# Patient Record
Sex: Female | Born: 1985 | Race: White | Hispanic: Yes | Marital: Married | Smoking: Never smoker
Health system: Southern US, Community
[De-identification: ages and names within clinical notes are randomized; demographics above are authoritative.]

## PROBLEM LIST (undated history)

## (undated) DIAGNOSIS — R0602 Shortness of breath: Secondary | ICD-10-CM

## (undated) DIAGNOSIS — K641 Second degree hemorrhoids: Secondary | ICD-10-CM

## (undated) DIAGNOSIS — K8020 Calculus of gallbladder without cholecystitis without obstruction: Secondary | ICD-10-CM

## (undated) DIAGNOSIS — K2970 Gastritis, unspecified, without bleeding: Secondary | ICD-10-CM

## (undated) DIAGNOSIS — J45909 Unspecified asthma, uncomplicated: Secondary | ICD-10-CM

## (undated) DIAGNOSIS — H6690 Otitis media, unspecified, unspecified ear: Secondary | ICD-10-CM

## (undated) DIAGNOSIS — K635 Polyp of colon: Secondary | ICD-10-CM

## (undated) HISTORY — DX: Unspecified asthma, uncomplicated: J45.909

## (undated) HISTORY — DX: Otitis media, unspecified, unspecified ear: H66.90

## (undated) HISTORY — DX: Second degree hemorrhoids: K64.1

## (undated) HISTORY — PX: COLONOSCOPY: SHX174

## (undated) HISTORY — DX: Gastritis, unspecified, without bleeding: K29.70

## (undated) HISTORY — DX: Polyp of colon: K63.5

---

## 2005-05-12 ENCOUNTER — Emergency Department (HOSPITAL_COMMUNITY): Admission: EM | Admit: 2005-05-12 | Discharge: 2005-05-12 | Payer: Self-pay | Admitting: Family Medicine

## 2006-07-06 ENCOUNTER — Inpatient Hospital Stay (HOSPITAL_COMMUNITY): Admission: AD | Admit: 2006-07-06 | Discharge: 2006-07-08 | Payer: Self-pay | Admitting: Obstetrics

## 2008-06-19 ENCOUNTER — Ambulatory Visit: Payer: Self-pay | Admitting: Family Medicine

## 2008-06-23 ENCOUNTER — Ambulatory Visit (HOSPITAL_COMMUNITY): Admission: RE | Admit: 2008-06-23 | Discharge: 2008-06-23 | Payer: Self-pay | Admitting: Family Medicine

## 2008-06-23 ENCOUNTER — Ambulatory Visit: Payer: Self-pay | Admitting: *Deleted

## 2008-07-03 ENCOUNTER — Ambulatory Visit: Payer: Self-pay | Admitting: Family Medicine

## 2008-07-03 LAB — CONVERTED CEMR LAB
ALT: 15 units/L (ref 0–35)
AST: 14 units/L (ref 0–37)
BUN: 11 mg/dL (ref 6–23)
Basophils Absolute: 0 10*3/uL (ref 0.0–0.1)
Basophils Relative: 1 % (ref 0–1)
Calcium: 9 mg/dL (ref 8.4–10.5)
Cholesterol: 146 mg/dL (ref 0–200)
Creatinine, Ser: 0.63 mg/dL (ref 0.40–1.20)
Eosinophils Absolute: 0.2 10*3/uL (ref 0.0–0.7)
Glucose, Bld: 88 mg/dL (ref 70–99)
HCT: 41.5 % (ref 36.0–46.0)
Hemoglobin: 13.4 g/dL (ref 12.0–15.0)
LDL Cholesterol: 73 mg/dL (ref 0–99)
Lymphocytes Relative: 34 % (ref 12–46)
Lymphs Abs: 2.4 10*3/uL (ref 0.7–4.0)
Neutro Abs: 4.2 10*3/uL (ref 1.7–7.7)
Neutrophils Relative %: 59 % (ref 43–77)
Potassium: 4.1 meq/L (ref 3.5–5.3)
Triglycerides: 127 mg/dL (ref <150)
VLDL: 25 mg/dL (ref 0–40)

## 2008-07-10 ENCOUNTER — Ambulatory Visit: Payer: Self-pay | Admitting: Family Medicine

## 2009-08-01 ENCOUNTER — Inpatient Hospital Stay (HOSPITAL_COMMUNITY): Admission: RE | Admit: 2009-08-01 | Discharge: 2009-08-03 | Payer: Self-pay | Admitting: Obstetrics

## 2010-12-11 LAB — CBC
HCT: 32.9 % — ABNORMAL LOW (ref 36.0–46.0)
HCT: 35.6 % — ABNORMAL LOW (ref 36.0–46.0)
Hemoglobin: 11.8 g/dL — ABNORMAL LOW (ref 12.0–15.0)
MCV: 88 fL (ref 78.0–100.0)
MCV: 88.1 fL (ref 78.0–100.0)
Platelets: 198 10*3/uL (ref 150–400)
Platelets: 202 10*3/uL (ref 150–400)
RBC: 3.73 MIL/uL — ABNORMAL LOW (ref 3.87–5.11)
RBC: 4.04 MIL/uL (ref 3.87–5.11)
WBC: 15.2 10*3/uL — ABNORMAL HIGH (ref 4.0–10.5)

## 2011-01-24 NOTE — Op Note (Signed)
NAMEELIZBETH, POSA NO.:  [id number]   MEDICAL RECORD NO.:  [id number]          PATIENT TYPE:  INP   LOCATION:  9166                          FACILITY:  [facility]   PHYSICIAN:  Kathreen Cosier, M.D.DATE OF BIRTH:  1986-03-19   DATE OF PROCEDURE:  07/06/2006  DATE OF DISCHARGE:                                 OPERATIVE REPORT   DELIVERY NOTE:  The patient is a 25 year old primigravida who was in labor,  became fully dilated, and then pushed for 2 hours 15 minutes to a +3 station  and desired assistance.  A midline episiotomy was performed, a vacuum was  applied through one contraction on delivery of the vertex.  There were no  pop-offs.  It was a female, LOA, Apgar 9/9.  Team in attendance and the  nasopharynx DeLee suctioned prior to the delivery of the shoulders.  Placenta was spontaneous.  She had a third degree, which was repaired with 2-  0 Vicryl sutures.  Post repair, the sphincter was intact.           ______________________________  Kathreen Cosier, M.D.     BAM/MEDQ  D:  07/06/2006  T:  07/07/2006  Job:  [id number]

## 2011-02-12 ENCOUNTER — Inpatient Hospital Stay (HOSPITAL_COMMUNITY): Payer: Medicaid Other

## 2011-02-12 ENCOUNTER — Inpatient Hospital Stay (HOSPITAL_COMMUNITY)
Admission: AD | Admit: 2011-02-12 | Discharge: 2011-02-12 | Disposition: A | Discharge status: Home / Self Care | Payer: Medicaid Other | Source: Ambulatory Visit | Attending: Obstetrics | Admitting: Obstetrics

## 2011-02-12 DIAGNOSIS — O209 Hemorrhage in early pregnancy, unspecified: Secondary | ICD-10-CM | POA: Insufficient documentation

## 2011-02-12 LAB — CBC
HCT: 38.5 % (ref 36.0–46.0)
Hemoglobin: 13.2 g/dL (ref 12.0–15.0)
MCH: 29.5 pg (ref 26.0–34.0)
MCHC: 34.3 g/dL (ref 30.0–36.0)
MCV: 86.1 fL (ref 78.0–100.0)
Platelets: 235 10*3/uL (ref 150–400)
RDW: 12.6 % (ref 11.5–15.5)
WBC: 9.5 10*3/uL (ref 4.0–10.5)

## 2011-02-12 LAB — WET PREP, GENITAL
Trich, Wet Prep: NONE SEEN
Yeast Wet Prep HPF POC: NONE SEEN

## 2011-02-12 LAB — ABO/RH: ABO/RH(D): A POS

## 2011-02-14 ENCOUNTER — Other Ambulatory Visit (HOSPITAL_COMMUNITY): Payer: Self-pay | Admitting: Obstetrics

## 2011-02-14 DIAGNOSIS — IMO0002 Reserved for concepts with insufficient information to code with codable children: Secondary | ICD-10-CM

## 2011-02-17 ENCOUNTER — Ambulatory Visit (HOSPITAL_COMMUNITY)
Admission: RE | Admit: 2011-02-17 | Discharge: 2011-02-17 | Disposition: A | Discharge status: Home / Self Care | Payer: Medicaid Other | Source: Ambulatory Visit | Attending: Obstetrics | Admitting: Obstetrics

## 2011-02-17 DIAGNOSIS — N949 Unspecified condition associated with female genital organs and menstrual cycle: Secondary | ICD-10-CM | POA: Insufficient documentation

## 2011-02-17 DIAGNOSIS — IMO0002 Reserved for concepts with insufficient information to code with codable children: Secondary | ICD-10-CM | POA: Insufficient documentation

## 2011-03-05 ENCOUNTER — Other Ambulatory Visit (HOSPITAL_COMMUNITY): Payer: Self-pay | Admitting: Obstetrics

## 2011-03-05 DIAGNOSIS — R102 Pelvic and perineal pain: Secondary | ICD-10-CM

## 2011-03-07 ENCOUNTER — Ambulatory Visit (HOSPITAL_COMMUNITY)
Admission: RE | Admit: 2011-03-07 | Discharge: 2011-03-07 | Disposition: A | Discharge status: Home / Self Care | Payer: Medicaid Other | Source: Ambulatory Visit | Attending: Obstetrics | Admitting: Obstetrics

## 2011-03-07 DIAGNOSIS — O034 Incomplete spontaneous abortion without complication: Secondary | ICD-10-CM | POA: Insufficient documentation

## 2011-03-07 DIAGNOSIS — R102 Pelvic and perineal pain: Secondary | ICD-10-CM

## 2012-08-22 ENCOUNTER — Observation Stay (HOSPITAL_COMMUNITY)
Admission: EM | Admit: 2012-08-22 | Discharge: 2012-08-24 | Disposition: A | Discharge status: Home / Self Care | Payer: Self-pay | Attending: General Surgery | Admitting: General Surgery

## 2012-08-22 ENCOUNTER — Encounter (HOSPITAL_COMMUNITY): Payer: Self-pay | Admitting: *Deleted

## 2012-08-22 ENCOUNTER — Ambulatory Visit: Payer: Self-pay | Admitting: Internal Medicine

## 2012-08-22 VITALS — BP 144/91 | HR 71 | Temp 97.9°F | Resp 16 | Ht 63.58 in | Wt 201.6 lb

## 2012-08-22 DIAGNOSIS — K2970 Gastritis, unspecified, without bleeding: Secondary | ICD-10-CM

## 2012-08-22 DIAGNOSIS — R1013 Epigastric pain: Secondary | ICD-10-CM | POA: Insufficient documentation

## 2012-08-22 DIAGNOSIS — K8010 Calculus of gallbladder with chronic cholecystitis without obstruction: Principal | ICD-10-CM | POA: Insufficient documentation

## 2012-08-22 DIAGNOSIS — R1011 Right upper quadrant pain: Secondary | ICD-10-CM

## 2012-08-22 DIAGNOSIS — R1012 Left upper quadrant pain: Secondary | ICD-10-CM

## 2012-08-22 DIAGNOSIS — K819 Cholecystitis, unspecified: Secondary | ICD-10-CM

## 2012-08-22 HISTORY — DX: Calculus of gallbladder without cholecystitis without obstruction: K80.20

## 2012-08-22 HISTORY — DX: Shortness of breath: R06.02

## 2012-08-22 LAB — CBC WITH DIFFERENTIAL/PLATELET
Basophils Absolute: 0 10*3/uL (ref 0.0–0.1)
Eosinophils Absolute: 0.1 10*3/uL (ref 0.0–0.7)
Lymphocytes Relative: 17 % (ref 12–46)
Lymphs Abs: 2.2 10*3/uL (ref 0.7–4.0)
MCH: 29.5 pg (ref 26.0–34.0)
Neutrophils Relative %: 75 % (ref 43–77)
Platelets: 250 10*3/uL (ref 150–400)
RBC: 4.55 MIL/uL (ref 3.87–5.11)
RDW: 12.2 % (ref 11.5–15.5)
WBC: 13.3 10*3/uL — ABNORMAL HIGH (ref 4.0–10.5)

## 2012-08-22 LAB — COMPREHENSIVE METABOLIC PANEL
ALT: 17 U/L (ref 0–35)
ALT: 17 U/L (ref 0–35)
AST: 14 U/L (ref 0–37)
AST: 15 U/L (ref 0–37)
Albumin: 4.7 g/dL (ref 3.5–5.2)
Alkaline Phosphatase: 61 U/L (ref 39–117)
Alkaline Phosphatase: 67 U/L (ref 39–117)
BUN: 12 mg/dL (ref 6–23)
CO2: 25 mEq/L (ref 19–32)
Chloride: 105 mEq/L (ref 96–112)
GFR calc Af Amer: >90 mL/min (ref >90)
Glucose, Bld: 93 mg/dL (ref 70–99)
Glucose, Bld: 100 mg/dL — ABNORMAL HIGH (ref 70–99)
Potassium: 3.7 mEq/L (ref 3.5–5.1)
Sodium: 135 mEq/L (ref 135–145)
Total Protein: 7.4 g/dL (ref 6.0–8.3)
Total Protein: 8 g/dL (ref 6.0–8.3)

## 2012-08-22 LAB — POCT CBC
HCT, POC: 42.1 % (ref 37.7–47.9)
Hemoglobin: 13.2 g/dL (ref 12.2–16.2)
Lymph, poc: 3.4 (ref 0.6–3.4)
MCH, POC: 27.9 pg (ref 27–31.2)
MCV: 89 fL (ref 80–97)
MID (cbc): 0.6 (ref 0–0.9)
POC LYMPH PERCENT: 36.6 %L (ref 10–50)
POC MID %: 6 %M (ref 0–12)

## 2012-08-22 LAB — GLUCOSE, POCT (MANUAL RESULT ENTRY): POC Glucose: 90 mg/dl (ref 70–99)

## 2012-08-22 LAB — POCT SEDIMENTATION RATE: POCT SED RATE: 20 mm/hr (ref 0–22)

## 2012-08-22 LAB — LIPASE: Lipase: 12 U/L (ref 0–75)

## 2012-08-22 MED ORDER — GI COCKTAIL ~~LOC~~
30.0000 mL | Freq: Once | ORAL | Status: AC
  Administered 2012-08-22: 15 mL via ORAL
  Administered 2012-08-22: 30 mL via ORAL

## 2012-08-22 MED ORDER — HYDROCODONE-ACETAMINOPHEN 7.5-500 MG/15ML PO SOLN
10.0000 mL | Freq: Once | ORAL | Status: AC
  Administered 2012-08-22: 10 mL via ORAL

## 2012-08-22 MED ORDER — OMEPRAZOLE 40 MG PO CPDR: 40.0000 mg | DELAYED_RELEASE_CAPSULE | Freq: Every day | ORAL | Status: DC

## 2012-08-22 MED ORDER — HYDROCODONE-ACETAMINOPHEN 7.5-325 MG/15ML PO SOLN: 5.0000 mL | Freq: Four times a day (QID) | ORAL | Status: DC | PRN

## 2012-08-22 NOTE — Patient Instructions (Addendum)
Cholecystitis Cholecystitis is an inflammation of your gallbladder. It is usually caused by a buildup of gallstones or sludge (cholelithiasis) in your gallbladder. The gallbladder stores a fluid that helps digest fats (bile). Cholecystitis is serious and needs treatment right away.  CAUSES   Gallstones. Gallstones can block the tube that leads to your gallbladder, causing bile to build up. As bile builds up, the gallbladder becomes inflamed.  Bile duct problems, such as blockage from scarring or kinking.  Tumors. Tumors can stop bile from leaving your gallbladder correctly, causing bile to build up. As bile builds up, the gallbladder becomes inflamed. SYMPTOMS   Nausea.  Vomiting.  Abdominal pain, especially in the upper right area of your abdomen.  Abdominal tenderness or bloating.  Sweating.  Chills.  Fever.  Yellowing of the skin and the whites of the eyes (jaundice). DIAGNOSIS  Your caregiver may order blood tests to look for infection or gallbladder problems. Your caregiver may also order imaging tests, such as an ultrasound or computed tomography (CT) scan. Further tests may include a hepatobiliary iminodiacetic acid (HIDA) scan. This scan allows your caregiver to see your bile move from the liver to the gallbladder and to the small intestine. TREATMENT  A hospital stay is usually necessary to lessen the inflammation of your gallbladder. You may be required to not eat or drink (fast) for a certain amount of time. You may be given medicine to treat pain or an antibiotic medicine to treat an infection. Surgery may be needed to remove your gallbladder (cholecystectomy) once the inflammation has gone down. Surgery may be needed right away if you develop complications such as death of gallbladder tissue (gangrene) or a tear (perforation) of the gallbladder.  HOME CARE INSTRUCTIONS  Home care will depend on your treatment. In general:  If you were given antibiotics, take them as  directed. Finish them even if you start to feel better.  Only take over-the-counter or prescription medicines for pain, discomfort, or fever as directed by your caregiver.  Follow a low-fat diet until you see your caregiver again.  Keep all follow-up visits as directed by your caregiver. SEEK IMMEDIATE MEDICAL CARE IF:   Your pain is increasing and not controlled by medicines.  Your pain moves to another part of your abdomen or to your back.  You have a fever.  You have nausea and vomiting. MAKE SURE YOU:  Understand these instructions.  Will watch your condition.  Will get help right away if you are not doing well or get worse. Document Released: 08/25/2005 Document Revised: 11/17/2011 Document Reviewed: 07/11/2011 [facility] Patient Information 2013 [place], [place]. Gastritis, Adult Gastritis is soreness and swelling (inflammation) of the lining of the stomach. Gastritis can develop as a sudden onset (acute) or long-term (chronic) condition. If gastritis is not treated, it can lead to stomach bleeding and ulcers. CAUSES  Gastritis occurs when the stomach lining is weak or damaged. Digestive juices from the stomach then inflame the weakened stomach lining. The stomach lining may be weak or damaged due to viral or bacterial infections. One common bacterial infection is the Helicobacter pylori infection. Gastritis can also result from excessive alcohol consumption, taking certain medicines, or having too much acid in the stomach.  SYMPTOMS  In some cases, there are no symptoms. When symptoms are present, they may include:  Pain or a burning sensation in the upper abdomen.  Nausea.  Vomiting.  An uncomfortable feeling of fullness after eating. DIAGNOSIS  Your caregiver may suspect you have gastritis  based on your symptoms and a physical exam. To determine the cause of your gastritis, your caregiver may perform the following:  Blood or stool tests to check for the H pylori  bacterium.  Gastroscopy. A thin, flexible tube (endoscope) is passed down the esophagus and into the stomach. The endoscope has a light and camera on the end. Your caregiver uses the endoscope to view the inside of the stomach.  Taking a tissue sample (biopsy) from the stomach to examine under a microscope. TREATMENT  Depending on the cause of your gastritis, medicines may be prescribed. If you have a bacterial infection, such as an H pylori infection, antibiotics may be given. If your gastritis is caused by too much acid in the stomach, H2 blockers or antacids may be given. Your caregiver may recommend that you stop taking aspirin, ibuprofen, or other nonsteroidal anti-inflammatory drugs (NSAIDs). HOME CARE INSTRUCTIONS  Only take over-the-counter or prescription medicines as directed by your caregiver.  If you were given antibiotic medicines, take them as directed. Finish them even if you start to feel better.  Drink enough fluids to keep your urine clear or pale yellow.  Avoid foods and drinks that make your symptoms worse, such as:  Caffeine or alcoholic drinks.  Chocolate.  Peppermint or mint flavorings.  Garlic and onions.  Spicy foods.  Citrus fruits, such as oranges, lemons, or limes.  Tomato-based foods such as sauce, chili, salsa, and pizza.  Fried and fatty foods.  Eat small, frequent meals instead of large meals. SEEK IMMEDIATE MEDICAL CARE IF:   You have black or dark red stools.  You vomit blood or material that looks like coffee grounds.  You are unable to keep fluids down.  Your abdominal pain gets worse.  You have a fever.  You do not feel better after 1 week.  You have any other questions or concerns. MAKE SURE YOU:  Understand these instructions.  Will watch your condition.  Will get help right away if you are not doing well or get worse. Document Released: 08/19/2001 Document Revised: 02/24/2012 Document Reviewed: 10/08/2011 [facility]  Patient Information 2013 [place], [place].

## 2012-08-22 NOTE — ED Notes (Signed)
Pt reports epigastric pain that goes into her back.  Reports nausea, denies vomiting.  Pt reports being at urgent care earlier today, and was told to have an ultrasound in the morning.

## 2012-08-22 NOTE — Progress Notes (Signed)
  Subjective:    Patient ID: Dana Lindsey, female    DOB: Nov 11, 1985, 26 y.o.   MRN: [id number]  HPI 3rd attack of mid epigastric severe pain over last few years.No US done , no health insurance. No etoh,smoking. No hx of GB disese or pancreatitis but never worked up. No fever, nausea. She took tums and mylanta w/out success.   Review of Systems     Objective:   Physical Exam  Vitals reviewed. Constitutional: She is oriented to person, place, and time. She appears well-nourished. She appears distressed.  Eyes: EOM are normal. No scleral icterus.  Neck: Neck supple.  Cardiovascular: Normal rate, regular rhythm and normal heart sounds.   Pulmonary/Chest: Effort normal and breath sounds normal.  Abdominal: Bowel sounds are normal. She exhibits no distension, no ascites and no mass. There is no hepatosplenomegaly. There is tenderness in the right upper quadrant, epigastric area and left upper quadrant. There is guarding and positive Murphy's sign. There is no rigidity, no rebound, no CVA tenderness and no tenderness at McBurney's point.         Pain shoots thru to right mid thorax  Neurological: She is alert and oriented to person, place, and time. She exhibits normal muscle tone. Coordination normal.  Psychiatric: She has a normal mood and affect. Her behavior is normal.  Trial 45cc gi coctail/response Lortab 10cc po/in svere pain  Has had 905 relief of pain, thinks pink med helped most. Results for orders placed in visit on 08/22/12  POCT CBC      Component Value Range   WBC 9.2  4.6 - 10.2 K/uL   Lymph, poc 3.4  0.6 - 3.4   POC LYMPH PERCENT 36.6  10 - 50 %L   MID (cbc) 0.6  0 - 0.9   POC MID % 6.0  0 - 12 %M   POC Granulocyte 5.3  2 - 6.9   Granulocyte percent 57.4  37 - 80 %G   RBC 4.73  4.04 - 5.48 M/uL   Hemoglobin 13.2  12.2 - 16.2 g/dL   HCT, POC 40.9  81.1 - 47.9 %   MCV 89.0  80 - 97 fL   MCH, POC 27.9  27 - 31.2 pg   MCHC 31.4 (*) 31.8 - 35.4 g/dL   RDW, POC 91.4      Platelet Count, POC 284  142 - 424 K/uL   MPV 9.8  0 - 99.8 fL  GLUCOSE, POCT (MANUAL RESULT ENTRY)      Component Value Range   POC Glucose 90  70 - 99 mg/dl         Assessment & Plan:  Schedule abdominal US at hospital tonite or tomorrow Lortab elixir prn pain/Omeprazole 40mg  qd F/up 3days

## 2012-08-23 ENCOUNTER — Encounter (HOSPITAL_COMMUNITY): Payer: Self-pay | Admitting: Certified Registered"

## 2012-08-23 ENCOUNTER — Encounter (HOSPITAL_COMMUNITY): Payer: Self-pay | Admitting: General Practice

## 2012-08-23 ENCOUNTER — Telehealth: Payer: Self-pay

## 2012-08-23 ENCOUNTER — Encounter (HOSPITAL_COMMUNITY)
Admission: EM | Disposition: A | Discharge status: Home / Self Care | Payer: Self-pay | Source: Home / Self Care | Attending: Emergency Medicine

## 2012-08-23 ENCOUNTER — Observation Stay (HOSPITAL_COMMUNITY): Payer: Self-pay | Admitting: Certified Registered"

## 2012-08-23 ENCOUNTER — Observation Stay (HOSPITAL_COMMUNITY): Payer: Self-pay

## 2012-08-23 ENCOUNTER — Emergency Department (HOSPITAL_COMMUNITY): Payer: Self-pay

## 2012-08-23 DIAGNOSIS — K8010 Calculus of gallbladder with chronic cholecystitis without obstruction: Secondary | ICD-10-CM

## 2012-08-23 HISTORY — PX: CHOLECYSTECTOMY: SHX55

## 2012-08-23 LAB — URINALYSIS, ROUTINE W REFLEX MICROSCOPIC
Bilirubin Urine: NEGATIVE
Glucose, UA: NEGATIVE mg/dL
Specific Gravity, Urine: 1.023 (ref 1.005–1.030)
Urobilinogen, UA: 0.2 mg/dL (ref 0.0–1.0)
pH: 7 (ref 5.0–8.0)

## 2012-08-23 LAB — POCT PREGNANCY, URINE: Preg Test, Ur: NEGATIVE

## 2012-08-23 LAB — URINE MICROSCOPIC-ADD ON

## 2012-08-23 LAB — SURGICAL PCR SCREEN: MRSA, PCR: NEGATIVE

## 2012-08-23 SURGERY — LAPAROSCOPIC CHOLECYSTECTOMY WITH INTRAOPERATIVE CHOLANGIOGRAM
Anesthesia: General | Site: Abdomen | Wound class: Contaminated

## 2012-08-23 MED ORDER — SODIUM CHLORIDE 0.9 % IR SOLN
Status: DC | PRN
  Administered 2012-08-23: 1000 mL

## 2012-08-23 MED ORDER — ONDANSETRON HCL 4 MG/2ML IJ SOLN
4.0000 mg | Freq: Once | INTRAMUSCULAR | Status: AC
  Administered 2012-08-23: 4 mg via INTRAVENOUS
  Filled 2012-08-23: qty 2

## 2012-08-23 MED ORDER — ONDANSETRON HCL 4 MG/2ML IJ SOLN
4.0000 mg | Freq: Four times a day (QID) | INTRAMUSCULAR | Status: DC | PRN
  Administered 2012-08-23: 4 mg via INTRAVENOUS
  Filled 2012-08-23: qty 2

## 2012-08-23 MED ORDER — SODIUM CHLORIDE 0.9 % IV SOLN
Freq: Once | INTRAVENOUS | Status: AC
  Administered 2012-08-23: 10 mL/h via INTRAVENOUS

## 2012-08-23 MED ORDER — DEXTROSE 5 % IV SOLN
1.0000 g | Freq: Four times a day (QID) | INTRAVENOUS | Status: DC
  Administered 2012-08-23 – 2012-08-24 (×5): 1 g via INTRAVENOUS
  Filled 2012-08-23 (×7): qty 1

## 2012-08-23 MED ORDER — ONDANSETRON HCL 4 MG/2ML IJ SOLN: 4.0000 mg | Freq: Once | INTRAMUSCULAR | Status: AC | PRN

## 2012-08-23 MED ORDER — PANTOPRAZOLE SODIUM 40 MG IV SOLR
40.0000 mg | Freq: Every day | INTRAVENOUS | Status: DC
  Administered 2012-08-23: 40 mg via INTRAVENOUS
  Filled 2012-08-23 (×2): qty 40

## 2012-08-23 MED ORDER — OXYCODONE-ACETAMINOPHEN 5-325 MG PO TABS
1.0000 | ORAL_TABLET | ORAL | Status: DC | PRN
  Administered 2012-08-24: 2 via ORAL
  Filled 2012-08-23 (×2): qty 2

## 2012-08-23 MED ORDER — FENTANYL CITRATE 0.05 MG/ML IJ SOLN
INTRAMUSCULAR | Status: DC | PRN
Start: 2012-08-23 — End: 2012-08-23
  Administered 2012-08-23 (×2): 50 ug via INTRAVENOUS
  Administered 2012-08-23: 150 ug via INTRAVENOUS

## 2012-08-23 MED ORDER — MORPHINE SULFATE 2 MG/ML IJ SOLN
2.0000 mg | INTRAMUSCULAR | Status: DC | PRN
  Administered 2012-08-24 (×2): 2 mg via INTRAVENOUS
  Filled 2012-08-23 (×2): qty 1

## 2012-08-23 MED ORDER — SODIUM CHLORIDE 0.9 % IV SOLN
INTRAVENOUS | Status: DC
  Administered 2012-08-23: 100 mL/h via INTRAVENOUS

## 2012-08-23 MED ORDER — ACETAMINOPHEN 10 MG/ML IV SOLN
1000.0000 mg | Freq: Once | INTRAVENOUS | Status: AC | PRN
  Filled 2012-08-23: qty 100

## 2012-08-23 MED ORDER — DEXTROSE 5 % IV SOLN
INTRAVENOUS | Status: AC
  Filled 2012-08-23: qty 1

## 2012-08-23 MED ORDER — MIDAZOLAM HCL 5 MG/5ML IJ SOLN
INTRAMUSCULAR | Status: DC | PRN
  Administered 2012-08-23: 2 mg via INTRAVENOUS

## 2012-08-23 MED ORDER — ROCURONIUM BROMIDE 100 MG/10ML IV SOLN
INTRAVENOUS | Status: DC | PRN
  Administered 2012-08-23: 50 mg via INTRAVENOUS

## 2012-08-23 MED ORDER — PROPOFOL 10 MG/ML IV BOLUS
INTRAVENOUS | Status: DC | PRN
  Administered 2012-08-23: 140 mg via INTRAVENOUS

## 2012-08-23 MED ORDER — LACTATED RINGERS IV SOLN
INTRAVENOUS | Status: DC | PRN
  Administered 2012-08-23 (×2): via INTRAVENOUS

## 2012-08-23 MED ORDER — ONDANSETRON HCL 4 MG/2ML IJ SOLN
4.0000 mg | Freq: Once | INTRAMUSCULAR | Status: AC
  Administered 2012-08-24: 4 mg via INTRAVENOUS
  Filled 2012-08-23: qty 2

## 2012-08-23 MED ORDER — BUPIVACAINE-EPINEPHRINE 0.5% -1:200000 IJ SOLN
INTRAMUSCULAR | Status: DC | PRN
  Administered 2012-08-23: 9 mL

## 2012-08-23 MED ORDER — HYDROMORPHONE HCL PF 1 MG/ML IJ SOLN
INTRAMUSCULAR | Status: DC | PRN
  Administered 2012-08-23: 1 mg via INTRAVENOUS

## 2012-08-23 MED ORDER — GLYCOPYRROLATE 0.2 MG/ML IJ SOLN
INTRAMUSCULAR | Status: DC | PRN
  Administered 2012-08-23: 0.4 mg via INTRAVENOUS

## 2012-08-23 MED ORDER — MORPHINE SULFATE 4 MG/ML IJ SOLN
4.0000 mg | Freq: Once | INTRAMUSCULAR | Status: AC
  Administered 2012-08-23: 4 mg via INTRAVENOUS
  Filled 2012-08-23: qty 1

## 2012-08-23 MED ORDER — HYDROMORPHONE HCL PF 1 MG/ML IJ SOLN: 0.2500 mg | INTRAMUSCULAR | Status: DC | PRN

## 2012-08-23 MED ORDER — ONDANSETRON HCL 4 MG/2ML IJ SOLN
INTRAMUSCULAR | Status: DC | PRN
  Administered 2012-08-23: 4 mg via INTRAVENOUS

## 2012-08-23 MED ORDER — FENTANYL CITRATE 0.05 MG/ML IJ SOLN
50.0000 ug | Freq: Once | INTRAMUSCULAR | Status: AC
  Administered 2012-08-23: 50 ug via INTRAVENOUS
  Filled 2012-08-23: qty 2

## 2012-08-23 MED ORDER — SODIUM CHLORIDE 0.9 % IV SOLN
INTRAVENOUS | Status: DC | PRN
  Administered 2012-08-23: 16:00:00

## 2012-08-23 MED ORDER — NEOSTIGMINE METHYLSULFATE 1 MG/ML IJ SOLN
INTRAMUSCULAR | Status: DC | PRN
  Administered 2012-08-23: 3 mg via INTRAVENOUS

## 2012-08-23 MED ORDER — MORPHINE SULFATE 2 MG/ML IJ SOLN
2.0000 mg | INTRAMUSCULAR | Status: DC | PRN
  Administered 2012-08-23 (×2): 2 mg via INTRAVENOUS
  Filled 2012-08-23 (×2): qty 1

## 2012-08-23 MED ORDER — LACTATED RINGERS IV SOLN
INTRAVENOUS | Status: DC
  Administered 2012-08-23: 14:00:00 via INTRAVENOUS

## 2012-08-23 SURGICAL SUPPLY — 50 items
ADH SKN CLS APL DERMABOND .7 (GAUZE/BANDAGES/DRESSINGS) ×2
APPLIER CLIP ROT 10 11.4 M/L (STAPLE) ×2
APR CLP MED LRG 11.4X10 (STAPLE) ×1
BAG SPEC RTRVL LRG 6X4 10 (ENDOMECHANICALS) ×1
BLADE SURG ROTATE 9660 (MISCELLANEOUS) IMPLANT
CANISTER SUCTION 2500CC (MISCELLANEOUS) ×2 IMPLANT
CHLORAPREP W/TINT 26ML (MISCELLANEOUS) ×2 IMPLANT
CLIP APPLIE ROT 10 11.4 M/L (STAPLE) ×1 IMPLANT
CLOTH BEACON ORANGE TIMEOUT ST (SAFETY) ×2 IMPLANT
COVER MAYO STAND STRL (DRAPES) ×2 IMPLANT
COVER SURGICAL LIGHT HANDLE (MISCELLANEOUS) ×2 IMPLANT
DECANTER SPIKE VIAL GLASS SM (MISCELLANEOUS) IMPLANT
DERMABOND ADVANCED (GAUZE/BANDAGES/DRESSINGS) ×2
DERMABOND ADVANCED .7 DNX12 (GAUZE/BANDAGES/DRESSINGS) ×2 IMPLANT
DRAPE C-ARM 42X72 X-RAY (DRAPES) ×2 IMPLANT
DRAPE UTILITY 15X26 W/TAPE STR (DRAPE) ×4 IMPLANT
ELECT REM PT RETURN 9FT ADLT (ELECTROSURGICAL) ×2
ELECTRODE REM PT RTRN 9FT ADLT (ELECTROSURGICAL) ×1 IMPLANT
GLOVE BIO SURGEON STRL SZ7.5 (GLOVE) ×2 IMPLANT
GLOVE BIO SURGEON STRL SZ8 (GLOVE) ×2 IMPLANT
GLOVE BIOGEL PI IND STRL 7.0 (GLOVE) ×1 IMPLANT
GLOVE BIOGEL PI IND STRL 7.5 (GLOVE) ×1 IMPLANT
GLOVE BIOGEL PI IND STRL 8 (GLOVE) ×2 IMPLANT
GLOVE BIOGEL PI INDICATOR 7.0 (GLOVE) ×1
GLOVE BIOGEL PI INDICATOR 7.5 (GLOVE) ×1
GLOVE BIOGEL PI INDICATOR 8 (GLOVE) ×2
GLOVE SS BIOGEL STRL SZ 7.5 (GLOVE) ×1 IMPLANT
GLOVE SUPERSENSE BIOGEL SZ 7.5 (GLOVE) ×1
GOWN PREVENTION PLUS XLARGE (GOWN DISPOSABLE) ×2 IMPLANT
GOWN STRL NON-REIN LRG LVL3 (GOWN DISPOSABLE) ×4 IMPLANT
GOWN STRL REIN XL XLG (GOWN DISPOSABLE) ×2 IMPLANT
KIT BASIN OR (CUSTOM PROCEDURE TRAY) ×2 IMPLANT
KIT ROOM TURNOVER OR (KITS) ×2 IMPLANT
NS IRRIG 1000ML POUR BTL (IV SOLUTION) ×2 IMPLANT
PAD ARMBOARD 7.5X6 YLW CONV (MISCELLANEOUS) ×4 IMPLANT
POUCH SPECIMEN RETRIEVAL 10MM (ENDOMECHANICALS) ×2 IMPLANT
SCISSORS LAP 5X35 DISP (ENDOMECHANICALS) IMPLANT
SET CHOLANGIOGRAPH 5 50 .035 (SET/KITS/TRAYS/PACK) ×2 IMPLANT
SET IRRIG TUBING LAPAROSCOPIC (IRRIGATION / IRRIGATOR) ×2 IMPLANT
SLEEVE Z-THREAD 5X100MM (TROCAR) ×2 IMPLANT
SPECIMEN JAR SMALL (MISCELLANEOUS) ×2 IMPLANT
SUT MON AB 5-0 PS2 18 (SUTURE) ×2 IMPLANT
TOWEL OR 17X24 6PK STRL BLUE (TOWEL DISPOSABLE) ×2 IMPLANT
TOWEL OR 17X26 10 PK STRL BLUE (TOWEL DISPOSABLE) ×2 IMPLANT
TOWEL OR NON WOVEN STRL DISP B (DISPOSABLE) ×2 IMPLANT
TRAY LAPAROSCOPIC (CUSTOM PROCEDURE TRAY) ×2 IMPLANT
TROCAR XCEL BLUNT TIP 100MML (ENDOMECHANICALS) ×2 IMPLANT
TROCAR Z-THREAD FIOS 11X100 BL (TROCAR) ×2 IMPLANT
TROCAR Z-THREAD FIOS 5X100MM (TROCAR) ×2 IMPLANT
WATER STERILE IRR 1000ML POUR (IV SOLUTION) IMPLANT

## 2012-08-23 NOTE — ED Provider Notes (Signed)
History     CSN: [id number]  Arrival date & time 08/22/12  2145   First MD Initiated Contact with Patient 08/23/12 0011      Chief Complaint  Patient presents with  . Abdominal Pain    (Consider location/radiation/quality/duration/timing/severity/associated sxs/prior treatment) HPI History provided by pt.   Pt has had intermittent post-prandial epigastric pain and N/V for the past 2 months.  Remote h/o similar sx and was diagnosed and treated for gastritis at that time.  Pain worsened today and is currently severe. Radiates to center of back and to left side of abdomen.  No aggravating/alleviating factors.  Associated w/ nausea.  Denies fever, cough, CP, SOB, hematochezia/melena and urinary sx.  Has had diarrhea but attributes to weight-loss tea she has been drinking.   Past Medical History  Diagnosis Date  . Gastritis     History reviewed. No pertinent past surgical history.  Family History  Problem Relation Age of Onset  . Hyperlipidemia Mother   . Alcoholism Father   . Asthma Daughter   . Asthma Son     History  Substance Use Topics  . Smoking status: Never Smoker   . Smokeless tobacco: Not on file  . Alcohol Use: No    OB History    Grav Para Term Preterm Abortions TAB SAB Ect Mult Living                  Review of Systems  All other systems reviewed and are negative.    Allergies  Review of patient's allergies indicates no known allergies.  Home Medications   Current Outpatient Rx  Name  Route  Sig  Dispense  Refill  . OMEPRAZOLE 40 MG PO CPDR   Oral   Take 1 capsule (40 mg total) by mouth daily.   30 capsule   3     BP 125/79  Pulse 61  Temp 98.8 F (37.1 C) (Oral)  Resp 20  SpO2 100%  Physical Exam  Nursing note and vitals reviewed. Constitutional: She is oriented to person, place, and time. She appears well-developed and well-nourished. No distress.  HENT:  Head: Normocephalic and atraumatic.  Eyes:       Normal appearance  Neck:  Normal range of motion.  Cardiovascular: Normal rate and regular rhythm.   Pulmonary/Chest: Effort normal and breath sounds normal. No respiratory distress.  Abdominal: Soft. Bowel sounds are normal. She exhibits no distension and no mass. There is no rebound and no guarding.       Obese. RUQ ttp w/ guarding.  Positive Murphy's sign.   Genitourinary:       No CVA tenderness  Musculoskeletal: Normal range of motion.  Neurological: She is alert and oriented to person, place, and time.  Skin: Skin is warm and dry. No rash noted.  Psychiatric: She has a normal mood and affect. Her behavior is normal.    ED Course  Procedures (including critical care time)  Labs Reviewed  CBC WITH DIFFERENTIAL - Abnormal; Notable for the following:    WBC 13.3 (*)     Neutro Abs 10.0 (*)     All other components within normal limits  COMPREHENSIVE METABOLIC PANEL - Abnormal; Notable for the following:    Glucose, Bld 100 (*)     All other components within normal limits  URINALYSIS, ROUTINE W REFLEX MICROSCOPIC - Abnormal; Notable for the following:    APPearance CLOUDY (*)     Hgb urine dipstick MODERATE (*)  All other components within normal limits  URINE MICROSCOPIC-ADD ON - Abnormal; Notable for the following:    Squamous Epithelial / LPF FEW (*)     Bacteria, UA FEW (*)     All other components within normal limits  LIPASE, BLOOD  POCT PREGNANCY, URINE   US Abdomen Complete  08/23/2012  *RADIOLOGY REPORT*  Clinical Data:  Right upper quadrant abdominal pain  COMPLETE ABDOMINAL ULTRASOUND  Comparison:  None.  Findings:  Gallbladder:  Distended gallbladder with suspected 9 mm stone in the gallbladder neck.  Additional 9 mm stone in the gallbladder fundus.  No associated gallbladder wall thickening.  Possible mild pericholecystic fluid.  Positive sonographic Murphy's sign.  Common bile duct:  Measures 4 mm.  Liver:  No focal lesion identified.  Within normal limits in parenchymal echogenicity.   IVC:  Appears normal.  Pancreas:  No focal abnormality seen.  Spleen:  Measures 6.9 cm.  Right Kidney:  Measures 12.3 cm.  No mass or hydronephrosis.  Left Kidney:  Measures 12.7 cm.  No mass or hydronephrosis.  Abdominal aorta:  No aneurysm identified.  IMPRESSION: Cholelithiasis with gallbladder distension, possible mild pericholecystic fluid, and positive sonographic Murphy's sign.  These findings are worrisome for early acute cholecystitis.  Consider hepatobiliary nuclear medicine scan for confirmation as clinically warranted.   Original Report Authenticated By: Charline Bills, M.D.      1. Cholecystitis       MDM  26yo obese but otherwise healthy F presents w/ intermittent post-prandial epigastric pain and nausea.  RUQ tenderness w/ positive murphy's sign and no peritoneal signs on exam.  [place] consistent w/ cholelithiasis and acute cholecystitis and labs unremarkable, w/ exception of hematuria.  GS consulted for admission.  Pt aware of hematuria and I recommended that she be rechecked by her doctor.           Otilio Miu, PA-C 08/23/12 [phone]

## 2012-08-23 NOTE — Preoperative (Signed)
Beta Blockers   Reason not to administer Beta Blockers:Not Applicable 

## 2012-08-23 NOTE — Op Note (Signed)
Preoperative diagnosis: Cholelithiasis and cholecystitis  Postoperative diagnosis: Cholelithiasis and cholecystitis  Surgical procedure: Laparoscopic cholecystectomy with intraoperative cholangiogram  Surgeon: Sharlet Salina T. Alka Falwell M.D.  Assistant: None  Anesthesia: General Endotracheal  Complications: None  Estimated blood loss: Minimal  Description of procedure: The patient brought to the operating room, placed in the supine position on the operating table, and general endotracheal anesthesia induced. The abdomen was widely sterilely prepped and draped. The patient had received preoperative IV antibiotics and PAS were in place. Patient timeout was performed the correct procedure verified. Standard 4 port technique was used with an open Hassan cannula at the umbilicus and the remainder of the ports placed under direct vision. The gallbladder was visualized. It appeared acutely inflamed, hemorrhagic and edematous.  The GB was decompressed with an aspiration needle. The fundus was grasped and elevated up over the liver and the infundibulum retracted inferiolaterally. Peritoneum anterior and posterior to close triangle was incised and fibrofatty tissue stripped off the neck of the gallbladder toward the porta hepatis. The distal gallbladder was thoroughly dissected. The cystic artery was identified in close triangle and the cystic duct gallbladder junction dissected 360.  A good critical view was obtained. When the anatomy was clear the cystic duct was clipped at the gallbladder junction and an operative cholangiogram obtained through the cystic duct. This showed good filling of a normal common bile duct and intrahepatic ducts with free flow into the duodenum and no filling defects. Following this the Cholangiocath was removed and the cystic duct was doubly clipped proximally and divided. The cystic artery was doubly clipped proximally and distally and divided. The gallbladder was dissected free from  its bed using hook cautery and removed through the umbilical port site. Complete hemostasis was obtained in the gallbladder bed. The right upper quadrant was thoroughly irrigated and hemostasis assured. Trochars were removed and all CO2 evacuated and the [facility] trocar site fascial defect closed. Skin incisions were closed with subcuticular Monocryl and Dermabond. Sponge needle and instrument counts were correct. The patient was taken to PACU in good condition.  Greta Yung T  08/23/2012

## 2012-08-23 NOTE — Anesthesia Preprocedure Evaluation (Signed)
Anesthesia Evaluation  Patient identified by MRN, date of birth, ID band Patient awake    Reviewed: Allergy & Precautions, H&P , NPO status , Patient's Chart, lab work & pertinent test results  Airway Mallampati: II      Dental  (+) Teeth Intact   Pulmonary  breath sounds clear to auscultation        Cardiovascular Rhythm:Regular Rate:Normal     Neuro/Psych    GI/Hepatic   Endo/Other    Renal/GU      Musculoskeletal   Abdominal   Peds  Hematology   Anesthesia Other Findings   Reproductive/Obstetrics                           Anesthesia Physical Anesthesia Plan  ASA: II  Anesthesia Plan: General   Post-op Pain Management:    Induction: Intravenous  Airway Management Planned: Oral ETT  Additional Equipment:   Intra-op Plan:   Post-operative Plan: Extubation in OR  Informed Consent: I have reviewed the patients History and Physical, chart, labs and discussed the procedure including the risks, benefits and alternatives for the proposed anesthesia with the patient or authorized representative who has indicated his/her understanding and acceptance.   Dental advisory given  Plan Discussed with: CRNA and Surgeon  Anesthesia Plan Comments: (Cholelithiasis  Plan GA with oral ETT  Kipp Brood, MD)        Anesthesia Quick Evaluation

## 2012-08-23 NOTE — Progress Notes (Signed)
Utilization review completed. Magdala Brahmbhatt, RN, BSN. 

## 2012-08-23 NOTE — ED Notes (Signed)
EDPA in to see pt 

## 2012-08-23 NOTE — H&P (Signed)
Dana Lindsey is an 26 y.o. female.   Chief Complaint: abdominal pain, referred by Dr. Renae Gloss HPI: 11 yof with pmh of gastritis treated with prilosec.  She had episode 2 and 7 years ago.  Was recommended to get u/s before but didn't due to cost.  Today she has had ruq pain that is constant, aggravated by palpation, relieved by nothing, associated with nausea leading her to present to er.  She has elevated wbc and u/s showing stones as well as pos sonographic murphys sign. I was consulted to evaluated for acute cholecystitis.  She has also been having issues with her hemorrhoids lately.  Past Medical History  Diagnosis Date  . Gastritis     History reviewed. No pertinent past surgical history.  Family History  Problem Relation Age of Onset  . Hyperlipidemia Mother   . Alcoholism Father   . Asthma Daughter   . Asthma Son    Social History:  reports that she has never smoked. She does not have any smokeless tobacco history on file. She reports that she does not drink alcohol or use illicit drugs.  Allergies: No Known Allergies  Meds none  Results for orders placed during the hospital encounter of 08/22/12 (from the past 48 hour(s))  CBC WITH DIFFERENTIAL     Status: Abnormal   Collection Time   08/22/12 10:10 PM      Component Value Range Comment   WBC 13.3 (*) 4.0 - 10.5 K/uL    RBC 4.55  3.87 - 5.11 MIL/uL    Hemoglobin 13.4  12.0 - 15.0 g/dL    HCT 45.4  09.8 - 11.9 %    MCV 82.4  78.0 - 100.0 fL    MCH 29.5  26.0 - 34.0 pg    MCHC 35.7  30.0 - 36.0 g/dL    RDW 14.7  82.9 - 56.2 %    Platelets 250  150 - 400 K/uL    Neutrophils Relative 75  43 - 77 %    Neutro Abs 10.0 (*) 1.7 - 7.7 K/uL    Lymphocytes Relative 17  12 - 46 %    Lymphs Abs 2.2  0.7 - 4.0 K/uL    Monocytes Relative 7  3 - 12 %    Monocytes Absolute 1.0  0.1 - 1.0 K/uL    Eosinophils Relative 1  0 - 5 %    Eosinophils Absolute 0.1  0.0 - 0.7 K/uL    Basophils Relative 0  0 - 1 %    Basophils Absolute  0.0  0.0 - 0.1 K/uL   COMPREHENSIVE METABOLIC PANEL     Status: Abnormal   Collection Time   08/22/12 10:10 PM      Component Value Range Comment   Sodium 135  135 - 145 mEq/L    Potassium 3.7  3.5 - 5.1 mEq/L    Chloride 99  96 - 112 mEq/L    CO2 23  19 - 32 mEq/L    Glucose, Bld 100 (*) 70 - 99 mg/dL    BUN 10  6 - 23 mg/dL    Creatinine, Ser 1.30  0.50 - 1.10 mg/dL    Calcium 9.7  8.4 - 86.5 mg/dL    Total Protein 8.0  6.0 - 8.3 g/dL    Albumin 4.2  3.5 - 5.2 g/dL    AST 15  0 - 37 U/L    ALT 17  0 - 35 U/L    Alkaline Phosphatase  67  39 - 117 U/L    Total Bilirubin 0.4  0.3 - 1.2 mg/dL    GFR calc non Af Amer >90  >90 mL/min    GFR calc Af Amer >90  >90 mL/min   LIPASE, BLOOD     Status: Normal   Collection Time   08/22/12 10:10 PM      Component Value Range Comment   Lipase 20  11 - 59 U/L   URINALYSIS, ROUTINE W REFLEX MICROSCOPIC     Status: Abnormal   Collection Time   08/22/12 11:58 PM      Component Value Range Comment   Color, Urine YELLOW  YELLOW    APPearance CLOUDY (*) CLEAR    Specific Gravity, Urine 1.023  1.005 - 1.030    pH 7.0  5.0 - 8.0    Glucose, UA NEGATIVE  NEGATIVE mg/dL    Hgb urine dipstick MODERATE (*) NEGATIVE    Bilirubin Urine NEGATIVE  NEGATIVE    Ketones, ur NEGATIVE  NEGATIVE mg/dL    Protein, ur NEGATIVE  NEGATIVE mg/dL    Urobilinogen, UA 0.2  0.0 - 1.0 mg/dL    Nitrite NEGATIVE  NEGATIVE    Leukocytes, UA NEGATIVE  NEGATIVE   URINE MICROSCOPIC-ADD ON     Status: Abnormal   Collection Time   08/22/12 11:58 PM      Component Value Range Comment   Squamous Epithelial / LPF FEW (*) RARE    WBC, UA 0-2  <3 WBC/hpf    RBC / HPF 11-20  <3 RBC/hpf    Bacteria, UA FEW (*) RARE    Urine-Other MUCOUS PRESENT     POCT PREGNANCY, URINE     Status: Normal   Collection Time   08/23/12 12:04 AM      Component Value Range Comment   Preg Test, Ur NEGATIVE  NEGATIVE    US Abdomen Complete  08/23/2012  *RADIOLOGY REPORT*  Clinical Data:   Right upper quadrant abdominal pain  COMPLETE ABDOMINAL ULTRASOUND  Comparison:  None.  Findings:  Gallbladder:  Distended gallbladder with suspected 9 mm stone in the gallbladder neck.  Additional 9 mm stone in the gallbladder fundus.  No associated gallbladder wall thickening.  Possible mild pericholecystic fluid.  Positive sonographic Murphy's sign.  Common bile duct:  Measures 4 mm.  Liver:  No focal lesion identified.  Within normal limits in parenchymal echogenicity.  IVC:  Appears normal.  Pancreas:  No focal abnormality seen.  Spleen:  Measures 6.9 cm.  Right Kidney:  Measures 12.3 cm.  No mass or hydronephrosis.  Left Kidney:  Measures 12.7 cm.  No mass or hydronephrosis.  Abdominal aorta:  No aneurysm identified.  IMPRESSION: Cholelithiasis with gallbladder distension, possible mild pericholecystic fluid, and positive sonographic Murphy's sign.  These findings are worrisome for early acute cholecystitis.  Consider hepatobiliary nuclear medicine scan for confirmation as clinically warranted.   Original Report Authenticated By: Charline Bills, M.D.     Review of Systems  Constitutional: Negative for fever and chills.  Gastrointestinal: Positive for nausea and abdominal pain. Negative for vomiting, diarrhea, constipation and blood in stool.    Blood pressure 125/79, pulse 61, temperature 98.8 F (37.1 C), temperature source Oral, resp. rate 20, SpO2 100.00%. Physical Exam  Vitals reviewed. Constitutional: She appears well-developed and well-nourished. No distress.  Eyes: No scleral icterus.  Cardiovascular: Normal rate, regular rhythm and normal heart sounds.   Respiratory: Effort normal and breath sounds normal. She has no wheezes.  She has no rales.  GI: Soft. Bowel sounds are normal. She exhibits no distension. There is tenderness in the right upper quadrant. There is positive Murphy's sign.  Lymphadenopathy:    She has no cervical adenopathy.  Skin: She is not diaphoretic.      Assessment/Plan Acute cholecystitis I recommended admission, npo, iv abx and lap chole later today. I discussed the procedure in detail.   We discussed the risks and benefits of a laparoscopic cholecystectomy and possible cholangiogram including, but not limited to bleeding, infection, injury to surrounding structures such as the intestine or liver, bile leak, retained gallstones, need to convert to an open procedure, prolonged diarrhea, blood clots such as  DVT, common bile duct injury, anesthesia risks, and possible need for additional procedures.  The likelihood of improvement in symptoms and return to the patient's normal status is good. We discussed the typical post-operative recovery course.   Dana Lindsey 08/23/2012, 2:35 AM

## 2012-08-23 NOTE — ED Notes (Signed)
Dr. Dwain Sarna (surgeon) at [facility].

## 2012-08-23 NOTE — ED Notes (Signed)
C/o abd pain, ongoing for ~ 2 months, comes and goes, constant & more severe since last night, "not going away", increase in pain after having milk and eggs this (sunday) morning for breakfast. Also reports has been drinking a pineapple herbal tea to lose weight. Has had yellow liquid diarrhea since starting tea 2 weeks ago. (denies: urinary or vaginal sx, vomiting, fever, bleeding, recent illness, or other sx). Alert, NAD, calm, interactive, skin W&D, resps e/u, speaking in clear complete sentences. Has been ambulatory in room & to b/r. To [place] now.

## 2012-08-23 NOTE — Transfer of Care (Signed)
Immediate Anesthesia Transfer of Care Note  Patient: Dana Lindsey  Procedure(s) Performed: Procedure(s) (LRB) with comments: LAPAROSCOPIC CHOLECYSTECTOMY WITH INTRAOPERATIVE CHOLANGIOGRAM (N/A)  Patient Location: PACU  Anesthesia Type:General  Level of Consciousness: sedated and patient cooperative  Airway & Oxygen Therapy: Patient Spontanous Breathing and Patient connected to nasal cannula oxygen  Post-op Assessment: Report given to PACU RN, Post -op Vital signs reviewed and stable and Patient moving all extremities  Post vital signs: Reviewed and stable  Complications: No apparent anesthesia complications

## 2012-08-23 NOTE — ED Notes (Addendum)
Reports pain 10/10, calm, NAD, resting, "was sleeping and pain woke her up when she took a deep breath", report called to 5N, preparing to transport. Denies nausea.

## 2012-08-23 NOTE — ED Notes (Signed)
Patient transported to Ultrasound 

## 2012-08-23 NOTE — ED Provider Notes (Signed)
Medical screening examination/treatment/procedure(s) were performed by non-physician practitioner and as supervising physician I was immediately available for consultation/collaboration.   [name] B. [name], MD 08/23/12 1409

## 2012-08-23 NOTE — Anesthesia Postprocedure Evaluation (Signed)
  Anesthesia Post-op Note  Patient: Dana Lindsey  Procedure(s) Performed: Procedure(s) (LRB) with comments: LAPAROSCOPIC CHOLECYSTECTOMY WITH INTRAOPERATIVE CHOLANGIOGRAM (N/A)  Patient Location: PACU  Anesthesia Type:General  Level of Consciousness: awake, alert  and oriented  Airway and Oxygen Therapy: Patient Spontanous Breathing  Post-op Pain: mild  Post-op Assessment: Post-op Vital signs reviewed and Patient's Cardiovascular Status Stable  Post-op Vital Signs: stable  Complications: No apparent anesthesia complications

## 2012-08-23 NOTE — Telephone Encounter (Signed)
PT STATES DR GUEST WANTED HER TO GO HAVE AN ULTRA SOUND DONE AT THE HOSPITAL AND THEY WANT TO TAKE OUT HER GALL BLADDER. WOULD LIKE TO HAVE A SECOND OPINION FROM SOMEONE HERE. PLEASE CALL [phone]

## 2012-08-23 NOTE — ED Notes (Signed)
Denies nausea, rates pain 8/10, pain unchanged. Pt up to b/r to void, pt admits now to having 5 crackers after [place]. Pt alert, NAD, calm, interactive, steady gait.

## 2012-08-23 NOTE — Telephone Encounter (Signed)
I have spoken to patient she has been diagnosed with gall stones and is scheduled to have surgery. She is asking for non surgical options, she is asking for antibiotics. I have advised her antibiotics will not help gall stones, had the [place] been normal this would have been the treatment course. Since she has gall stones, antibiotics are not indicated.

## 2012-08-24 ENCOUNTER — Telehealth: Payer: Self-pay

## 2012-08-24 ENCOUNTER — Encounter: Payer: Self-pay | Admitting: *Deleted

## 2012-08-24 ENCOUNTER — Encounter (HOSPITAL_COMMUNITY): Payer: Self-pay | Admitting: General Surgery

## 2012-08-24 MED ORDER — OXYCODONE-ACETAMINOPHEN 5-325 MG PO TABS: 1.0000 | ORAL_TABLET | ORAL | Status: DC | PRN

## 2012-08-24 MED ORDER — ONDANSETRON HCL 4 MG PO TABS: 4.0000 mg | ORAL_TABLET | Freq: Three times a day (TID) | ORAL | Status: DC | PRN

## 2012-08-24 NOTE — Telephone Encounter (Addendum)
PATIENT WOULD LIKE DR. Perrin Maltese TO KNOW THAT SHE SAW HIM Sunday FOR ABDOMINAL PAIN. SHE WENT TO [place] ON Sunday AND HAD URGENT EMERGENCY SURGERY ON HER GALL BLADDER. SHE HAD INFECTION AND GALL STONES. DR. Perrin Maltese WANTED HER TO COME BACK TO SEE HIM , BUT SHE WILL JUST GO TO SEE THE SURGEON (DR. HOCKSWORTH )  IN 3 WEEKS FOR HER RECHECK. SHE JUST WANTED DR. Perrin Maltese TO KNOW. BEST PHONE [phone] (HOME)   MBC

## 2012-08-24 NOTE — Discharge Summary (Signed)
Physician Discharge Summary  Patient ID: Dana Lindsey MRN: [id number] DOB/AGE: Jul 16, 1986 26 y.o.  Admit date: 08/22/2012 Discharge date: 08/24/2012  Admission Diagnoses:Cholelithiasis and cholecystitis   Discharge Diagnoses: Laparoscopic cholecystectomy with intraoperative cholangiogram  Active Problems:  * No active hospital problems. *    Discharged Condition: stable  Hospital Course 70 yof with pmh of gastritis treated with prilosec. She had episode 2 and 7 years ago. Was recommended to get u/s before but didn't due to cost. Today she has had ruq pain that is constant, aggravated by palpation, relieved by nothing, associated with nausea leading her to present to er. She has elevated wbc and u/s showing stones as well as pos sonographic murphys sign. I was consulted to evaluated for acute cholecystitis.  She has also been having issues with her hemorrhoids lately. Patient underwent laparoscopic cholecystectomy with IOC on 12/16./13 by Dr. Adriana Mccallum; postoperatively she initially had some nausea with food, but this has since resolved. She has remained hemodynamically stable and afebrile w/o further N/V and has tolerated a diet. At this time she is stable for discharge to home/self care. She has been given written instructions as to her post op care as well as our office contact numbers. The patient has verbalized understanding of all instructions.   Consults: None  Significant Diagnostic Studies: labs, microbiology and radiology  Treatments: IV hydration, antibiotics, analgesia, respiratory therapy, procedures and surgery.  Discharge Exam: Blood pressure 105/55, pulse 65, temperature 98.8 F (37.1 C), temperature source Oral, resp. rate 16, height 5' 3.58" (1.615 m), weight 201 lb 9.6 oz (91.445 kg), SpO2 98.00%. General appearance: alert, cooperative, appears stated age and no distress Chest: CTA Cardiac: RRR Abdomen: all surgical wounds appear to be well approximated and  w/o drainage or erythema or ecchymosis. Abdomen is appropriately tender over RUQ. + BS, No N/V/D  Disposition: 01-Home or Self Care     Medication List     As of 08/24/2012  8:26 AM    TAKE these medications         omeprazole 40 MG capsule   Commonly known as: PRILOSEC   Take 1 capsule (40 mg total) by mouth daily.      ondansetron 4 MG tablet   Commonly known as: ZOFRAN   Take 1 tablet (4 mg total) by mouth every 8 (eight) hours as needed for nausea.      oxyCODONE-acetaminophen 5-325 MG per tablet   Commonly known as: PERCOCET/ROXICET   Take 1-2 tablets by mouth every 4 (four) hours as needed.           Follow-up Information    Follow up with HOXWORTH,BENJAMIN T, MD. Schedule an appointment as soon as possible for a visit in 3 weeks. (Please call our office to schedule your follow up appointment. You can always call our office as needed if symptoms worsen)    Contact information:   [address] [place] [address] [phone]          Signed: Blenda Mounts [facility] Pager # [phone] 08/24/2012, 8:26 AM

## 2012-09-09 ENCOUNTER — Encounter (INDEPENDENT_AMBULATORY_CARE_PROVIDER_SITE_OTHER): Payer: Self-pay | Admitting: General Surgery

## 2012-09-16 ENCOUNTER — Ambulatory Visit (INDEPENDENT_AMBULATORY_CARE_PROVIDER_SITE_OTHER): Payer: Self-pay | Admitting: General Surgery

## 2012-09-16 VITALS — BP 124/74 | HR 74 | Temp 97.6°F | Resp 18 | Ht 63.0 in | Wt 198.8 lb

## 2012-09-16 DIAGNOSIS — Z09 Encounter for follow-up examination after completed treatment for conditions other than malignant neoplasm: Secondary | ICD-10-CM

## 2012-09-16 NOTE — Progress Notes (Signed)
History: Patient returns approximately 3 weeks following emergency laparoscopic cholecystectomy for severe acute cholecystitis. She does get along very well. She still notices a little mild discomfort below her right scapula. She's generally tolerating her diet well although fatty foods don't agree with her. She's had a little constipation relieved with MiraLAX. Overall feels she is doing well.  Exam: BP 124/74  Pulse 74  Temp 97.6 F (36.4 C) (Temporal)  Resp 18  Ht 5\' 3"  (1.6 m)  Wt 198 lb 12.8 oz (90.175 kg)  BMI 35.22 kg/m2 General: Appears well Abdomen: Minimal right upper quadrant tenderness. Incisions all well healed no hernias.   Assessment and plan: Doing well following arthroscopic cholecystectomy without complication identified. I told him call me as needed if her mild discomfort and food intolerance does not improve.

## 2012-11-04 ENCOUNTER — Ambulatory Visit: Payer: Self-pay | Admitting: Internal Medicine

## 2012-11-04 VITALS — BP 110/74 | HR 68 | Temp 98.4°F | Resp 16 | Ht 63.5 in | Wt 199.8 lb

## 2012-11-04 DIAGNOSIS — H60399 Other infective otitis externa, unspecified ear: Secondary | ICD-10-CM

## 2012-11-04 DIAGNOSIS — H833X9 Noise effects on inner ear, unspecified ear: Secondary | ICD-10-CM

## 2012-11-04 DIAGNOSIS — H6093 Unspecified otitis externa, bilateral: Secondary | ICD-10-CM

## 2012-11-04 DIAGNOSIS — H9203 Otalgia, bilateral: Secondary | ICD-10-CM

## 2012-11-04 LAB — POCT CBC
Granulocyte percent: 67 %G (ref 37–80)
Hemoglobin: 12.9 g/dL (ref 12.2–16.2)
MPV: 9.8 fL (ref 0–99.8)
POC Granulocyte: 7 — AB (ref 2–6.9)
POC MID %: 6.3 %M (ref 0–12)
Platelet Count, POC: 265 10*3/uL (ref 142–424)
RBC: 4.6 M/uL (ref 4.04–5.48)

## 2012-11-04 LAB — GLUCOSE, POCT (MANUAL RESULT ENTRY): POC Glucose: 93 mg/dl (ref 70–99)

## 2012-11-04 MED ORDER — CIPROFLOXACIN HCL 500 MG PO TABS: 500.0000 mg | ORAL_TABLET | Freq: Two times a day (BID) | ORAL | Status: DC

## 2012-11-04 MED ORDER — HYDROCODONE-ACETAMINOPHEN 5-325 MG PO TABS: 1.0000 | ORAL_TABLET | Freq: Four times a day (QID) | ORAL | Status: DC | PRN

## 2012-11-04 NOTE — Progress Notes (Signed)
  Subjective:    Patient ID: Dana Lindsey, female    DOB: [birth year], 27 y.o.   MRN: [id number]  HPI Has severe ear pain bilateral, using q tips for itching. No hx of diabetes. No fever. Painful to touch external ear. No reddness of pinna.   Review of Systems neg    Objective:   Physical Exam  Vitals reviewed. Constitutional: She is oriented to person, place, and time. She appears well-developed and well-nourished.  HENT:  Right Ear: There is swelling and tenderness. No drainage. No foreign bodies. Tympanic membrane is erythematous. Tympanic membrane is not injected. Tympanic membrane mobility is normal. No middle ear effusion. No decreased hearing is noted.  Left Ear: There is drainage, swelling and tenderness. No foreign bodies. Tympanic membrane is erythematous. Tympanic membrane is not injected. Tympanic membrane mobility is normal.  No middle ear effusion. Decreased hearing is noted.  Exudative discharge left greater than right Canal red, tender, swollen left greater than right  Eyes: EOM are normal.  Neck: Neck supple.  Pulmonary/Chest: Effort normal.  Neurological: She is alert and oriented to person, place, and time. She exhibits normal muscle tone. Coordination normal.  Skin: Rash noted.  Psychiatric: She has a normal mood and affect.   Results for orders placed in visit on 11/04/12  POCT CBC      Result Value Range   WBC 10.5 (*) 4.6 - 10.2 K/uL   Lymph, poc 2.8  0.6 - 3.4   POC LYMPH PERCENT 26.7  10 - 50 %L   MID (cbc) 0.7  0 - 0.9   POC MID % 6.3  0 - 12 %M   POC Granulocyte 7.0 (*) 2 - 6.9   Granulocyte percent 67.0  37 - 80 %G   RBC 4.60  4.04 - 5.48 M/uL   Hemoglobin 12.9  12.2 - 16.2 g/dL   HCT, POC 40.9 (*) 81.1 - 47.9 %   MCV 88.7  80 - 97 fL   MCH, POC 28.0  27 - 31.2 pg   MCHC 31.6 (*) 31.8 - 35.4 g/dL   RDW, POC 91.4     Platelet Count, POC 265  142 - 424 K/uL   MPV 9.8  0 - 99.8 fL  GLUCOSE, POCT (MANUAL RESULT ENTRY)      Result Value Range   POC Glucose 93  70 - 99 mg/dl          Assessment & Plan:  External Otitis bilateral Cortisporin otic drops/Cipro 500mg /HC prn pain Popes wick in left ear canal

## 2012-11-04 NOTE — Progress Notes (Signed)
Patient here for left earache, started Monday, also has pain when eating. Denies sinus pressure, fever or sore throat. Taking otc with no relieve

## 2012-11-04 NOTE — Patient Instructions (Addendum)
Otitis Externa Otitis externa is a bacterial or fungal infection of the outer ear canal. This is the area from the eardrum to the outside of the ear. Otitis externa is sometimes called "swimmer's ear." CAUSES  Possible causes of infection include:  Swimming in dirty water.  Moisture remaining in the ear after swimming or bathing.  Mild injury (trauma) to the ear.  Objects stuck in the ear (foreign body).  Cuts or scrapes (abrasions) on the outside of the ear. SYMPTOMS  The first symptom of infection is often itching in the ear canal. Later signs and symptoms may include swelling and redness of the ear canal, ear pain, and yellowish-white fluid (pus) coming from the ear. The ear pain may be worse when pulling on the earlobe. DIAGNOSIS  Your caregiver will perform a physical exam. A sample of fluid may be taken from the ear and examined for bacteria or fungi. TREATMENT  Antibiotic ear drops are often given for 10 to 14 days. Treatment may also include pain medicine or corticosteroids to reduce itching and swelling. PREVENTION   Keep your ear dry. Use the corner of a towel to absorb water out of the ear canal after swimming or bathing.  Avoid scratching or putting objects inside your ear. This can damage the ear canal or remove the protective wax that lines the canal. This makes it easier for bacteria and fungi to grow.  Avoid swimming in lakes, polluted water, or poorly chlorinated pools.  You may use ear drops made of rubbing alcohol and vinegar after swimming. Combine equal parts of white vinegar and alcohol in a bottle. Put 3 or 4 drops into each ear after swimming. HOME CARE INSTRUCTIONS   Apply antibiotic ear drops to the ear canal as prescribed by your caregiver.  Only take over-the-counter or prescription medicines for pain, discomfort, or fever as directed by your caregiver.  If you have diabetes, follow any additional treatment instructions from your caregiver.  Keep all  follow-up appointments as directed by your caregiver. SEEK MEDICAL CARE IF:   You have a fever.  Your ear is still red, swollen, painful, or draining pus after 3 days.  Your redness, swelling, or pain gets worse.  You have a severe headache.  You have redness, swelling, pain, or tenderness in the area behind your ear. MAKE SURE YOU:   Understand these instructions.  Will watch your condition.  Will get help right away if you are not doing well or get worse. Document Released: 08/25/2005 Document Revised: 11/17/2011 Document Reviewed: 09/11/2011 [employer] Patient Information 2013 [employer].  

## 2013-09-12 ENCOUNTER — Ambulatory Visit: Payer: Self-pay | Admitting: Advanced Practice Midwife

## 2014-03-16 ENCOUNTER — Encounter: Payer: Self-pay | Admitting: *Deleted

## 2014-03-24 ENCOUNTER — Encounter: Payer: Self-pay | Admitting: Obstetrics and Gynecology

## 2015-12-05 ENCOUNTER — Ambulatory Visit (INDEPENDENT_AMBULATORY_CARE_PROVIDER_SITE_OTHER): Payer: Self-pay | Admitting: Family Medicine

## 2015-12-05 VITALS — BP 112/80 | HR 62 | Temp 98.5°F | Resp 20 | Ht 63.78 in | Wt 204.0 lb

## 2015-12-05 DIAGNOSIS — N72 Inflammatory disease of cervix uteri: Secondary | ICD-10-CM

## 2015-12-05 DIAGNOSIS — R635 Abnormal weight gain: Secondary | ICD-10-CM

## 2015-12-05 DIAGNOSIS — N898 Other specified noninflammatory disorders of vagina: Secondary | ICD-10-CM

## 2015-12-05 DIAGNOSIS — N912 Amenorrhea, unspecified: Secondary | ICD-10-CM

## 2015-12-05 LAB — POCT URINALYSIS DIP (MANUAL ENTRY)
BILIRUBIN UA: NEGATIVE
Bilirubin, UA: NEGATIVE
Glucose, UA: NEGATIVE
Leukocytes, UA: NEGATIVE
Nitrite, UA: NEGATIVE
PROTEIN UA: NEGATIVE
SPEC GRAV UA: 1.02
Urobilinogen, UA: 0.2
pH, UA: 7

## 2015-12-05 LAB — POCT CBC
GRANULOCYTE PERCENT: 59.4 % (ref 37–80)
HEMATOCRIT: 37 % — AB (ref 37.7–47.9)
HEMOGLOBIN: 13.6 g/dL (ref 12.2–16.2)
Lymph, poc: 3 (ref 0.6–3.4)
MCH: 31.1 pg (ref 27–31.2)
MCHC: 36.7 g/dL — AB (ref 31.8–35.4)
MCV: 84.5 fL (ref 80–97)
MID (cbc): 0.6 (ref 0–0.9)
MPV: 7.8 fL (ref 0–99.8)
POC GRANULOCYTE: 5.3 (ref 2–6.9)
POC LYMPH %: 33.6 % (ref 10–50)
POC MID %: 7 %M (ref 0–12)
Platelet Count, POC: 232 10*3/uL (ref 142–424)
RBC: 4.38 M/uL (ref 4.04–5.48)
RDW, POC: 12.4 %
WBC: 8.9 10*3/uL (ref 4.6–10.2)

## 2015-12-05 LAB — POCT WET + KOH PREP
Trich by wet prep: ABSENT
YEAST BY KOH: ABSENT
Yeast by wet prep: ABSENT

## 2015-12-05 LAB — POCT URINE PREGNANCY: PREG TEST UR: NEGATIVE

## 2015-12-05 LAB — TSH: TSH: 1.54 mIU/L

## 2015-12-05 MED ORDER — CLOTRIMAZOLE-BETAMETHASONE 1-0.05 % EX CREA: 1.0000 "application " | TOPICAL_CREAM | Freq: Two times a day (BID) | CUTANEOUS | Status: DC

## 2015-12-05 MED ORDER — FLUCONAZOLE 150 MG PO TABS: 150.0000 mg | ORAL_TABLET | Freq: Once | ORAL | Status: DC

## 2015-12-05 MED ORDER — DOXYCYCLINE HYCLATE 100 MG PO CAPS: 100.0000 mg | ORAL_CAPSULE | Freq: Two times a day (BID) | ORAL | Status: DC

## 2015-12-05 NOTE — Patient Instructions (Addendum)
IF you received an x-ray today, you will receive an invoice from [facility]. Please contact [facility] at [phone] with questions or concerns regarding your invoice.   IF you received labwork today, you will receive an invoice from [employer]. Please contact [facility] at [phone] with questions or concerns regarding your invoice.   Our billing staff will not be able to assist you with questions regarding bills from these companies.  You will be contacted with the lab results as soon as they are available. The fastest way to get your results is to activate your My Chart account. Instructions are located on the last page of this paperwork. If you have not heard from [place] regarding the results in 2 weeks, please contact this office.     Dolor plvico, mujeres (Pelvic Pain, Female)  Las causa del dolor plvico en la mujer pueden ser muchas y pueden tener su origen en diferentes lugares. El dolor plvico es el que aparece en la mitad inferior del abdomen y [place] caderas. Puede aparecer durante en un perodo corto de tiempo (agudo)o puede ser recurrente (crnico). Esta afeccin puede estar relacionada con trastornos que afectan a los rganos reproductivos femeninos (ginecolgica), pero tambin puede deberse a problemas en la vejiga, clculos renales, complicaciones intestinales, o problemas musculares o esquelticos. Es Materials engineer ayuda de inmediato, sobre todo si ha sido intenso, [place], o ha aparecido de Geographical information systems officer sbita como un dolor inusual. Tambin es importante obtener ayuda de inmediato, ya que algunos tipos de dolor plvico puede poner en peligro la vida.  CAUSAS  A continuacin veremos algunas de las causas del dolor plvico. Las causas pueden clasificarse de diferentes modos.   Ginecolgica.  Enfermedad inflamatoria plvica.  Infecciones de transmisin sexual.  Quiste de ovario o torsin de un ligamento ovrico ( torsin  ovrica).  La membrana que recubre internamente al tero desarrollndose fuera del tero (endometriosis).  Fibromas, quistes o tumores.  Ovulacin.  Embarazo.  Embarazo fuera del tero (embarazo ectpico).  Aborto espontneo.  Trabajo de [place].  Desprendimiento de la placenta o ruptura del tero.  Infecciones.  Infeccin uterina (endometritis).  Infeccin de la vejiga.  Diverticulitis.  Aborto relacionado con una infeccin uterina (aborto sptico).  Vejiga.  Inflamacin de la vejiga (cistitis).  Clculos renales.  Gastrointenstinal.  Estreimiento.  Diverticulitis.  Neurolgico.  Traumatismos.  Sentir dolor plvico debido a causas mentales o emocionales (psicosomtico).  Tumores en el intestino o en la pelvis. EVALUACIN  El mdico har una historia clnica detallada segn sus sntomas. Incluir los cambios recientes en su salud, una cuidadosa historia ginecolgica de sus periodos (menstruaciones) y Ardelia Mems historia de su actividad sexual. Los antecedentes familiares y la historia clnica tambin son importantes. Su mdico podr indicar un examen plvico. El examen plvico ayudar a identificar la ubicacin y la gravedad del Social research officer, government. Tambin ayudar a [employer] rganos que pueden estar involucrados. Romie Minus identificar la causa del dolor plvico y tratarlo adecuadamente, el mdico puede indicar estudios. Estas pruebas pueden ser:   Test de embarazo.  Ecografa plvica.  Radiografa del abdomen.  Un anlisis de [place] o la evaluacin de la secrecin vaginal.  Anlisis de [place]. INSTRUCCIONES PARA EL CUIDADO EN EL HOGAR   Solo tome medicamentos de venta libre o recetados para Conservation officer, historic buildings, Tree surgeon o fiebre, segn las indicaciones del mdico.   Haga reposo segn las indicaciones del mdico.   Consuma una dieta balanceada.   Beba gran cantidad de lquido para Consulting civil engineer  orina de tono claro o [place]arillo plido.   Evite las relaciones sexuales, Forensic psychologist.   Aplique compresas calientes o fras en la zona baja del abdomen segn cual le calme el dolor.   Evite las situaciones estresantes.   Lleve un registro del dolor plvico. Anote cundo comenz, dnde se Midwife y si hay cosas que parecen estar asociadas con el dolor, como algn alimento o su ciclo menstrual.  Concurra a las visitas de control con el mdico, segn las indicaciones.  SOLICITE ATENCIN MDICA SI:   Los medicamentos no Buyer, retail.  Tiene flujo vaginal anormal. SOLICITE ATENCIN MDICA DE INMEDIATO SI:   Tiene un sangrado abundante por la vagina.   El dolor plvico aumenta.   Se siente mareada o sufre un desmayo.   Siente escalofros.   Siente dolor intenso al Garment/textile technologist u observa sangre en la orina.   Isabella que no puede controlar.   Tiene fiebre o sntomas que persisten durante ms de 3 das.  Tiene fiebre y los sntomas empeoran.   Ha sido abusada fsica o sexualmente.  ASEGRESE DE QUE:   Comprende estas instrucciones.  Controlar su enfermedad.  Solicitar ayuda de inmediato si no mejora o si empeora.   Esta informacin no tiene Marine scientist el consejo del mdico. Asegrese de hacerle al mdico cualquier pregunta que tenga.   Document Released: 11/21/2008 Document Revised: 01/09/2015 Elsevier Interactive Patient Education 2016 [employer]. Cervicitis (Cervicitis) Es el dolor e hinchazn (inflamacin) del cuello uterino. El cuello del tero se encuentra en la parte inferior del tero. Se abre hacia la vagina. [employer] transmisin sexual (ETS).   Reacciones alrgicas.   Medicamentos o dispositivos anticonceptivos que se Water engineer.   Traumatismo en el cuello del tero.   Infecciones bacterianas.  FACTORES DE RIESGO Usted tendr mayor riesgo de sufrir esta enfermedad si:  Tiene relaciones sexuales sin proteccin.  Ha tenido relaciones sexuales con  varias parejas.  Comienza a Clinical biochemist a una edad temprana.  Tiene una historia de ETS. SNTOMAS  Puede ser que no haya sntomas. Si hay sntomas, pueden ser:   Secrecin vaginal de color gris, blanco, am[place]illo o que tiene mal olor.   Picazn o dolor en la zona externa de la vagina.   Relaciones sexuales dolorosas.   Dolor en la zona baja del abdomen o la espalda, [place].   Ganas de orinar con frecuencia.   Sangrado vaginal anormal entre perodos, despus de las relaciones sexuales o despus de la menopausia.   Sensacin de presin o pesadez en la pelvis.  DIAGNSTICO  El diagnstico se realiza mediante un examen plvico. Otras pruebas son:   Examen de las secreciones en el microscopio (preparado fresco).   Papanicolau  TRATAMIENTO  El tratamiento depender de la causa de la cervicitis. Si la causa es una enfermedad de transmisin sexual, usted y su pareja necesitarn Arts administrator. Le prescribirn antibiticos.  INSTRUCCIONES PARA EL CUIDADO EN EL HOGAR   No tenga relaciones sexuales hasta que el mdico la autorice.   No tenga relaciones sexuales hasta que su compaero haya sido tratado si la causa de la cervicitis es una enfermedad de transmisin sexual.   Tome los antibiticos como se le indic. Tmelos todos, aunque se sienta mejor.  SOLICITE ATENCIN MDICA SI:  Los sntomas vuelven a Arts administrator.   Tiene fiebre.  ASEGRESE DE QUE:   Comprende  estas instrucciones.  Controlar su afeccin.  Recibir ayuda de inmediato si no mejora o si empeora.   Esta informacin no tiene Marine scientist el consejo del mdico. Asegrese de hacerle al mdico cualquier pregunta que tenga.   Document Released: 08/25/2005 Document Revised: 04/27/2013 Elsevier Interactive Patient Education [employer].

## 2015-12-05 NOTE — Progress Notes (Signed)
Subjective:  This chart was scribed for [name], by Tamsen Roers, at [facility].  This patient was seen in room 2 and the patient's care was started at 2:16 PM.    Patient ID: Dana Lindsey, female    DOB: 05/23/1986, 30 y.o.   MRN: [id number] Chief Complaint  Patient presents with  . Vaginitis    itching last monday  . Vaginal Discharge    white/green  . Fatigue  . Late Period    HPI HPI Comments: Dana Lindsey is a 30 y.o. female who presents to the [facility] complaining of vaginal itching and discharge (white/green) onset 9 days ago with associated symptoms of fatigue. Patient purchased Monstat three days ago and states that she no longer has "clumpy" discharge but still has the itching and some dysuria  She states that it is mostly on the outside of her vaginal area and not as much on the inside. She denies any odor, frequency, bowel or bladder incontinence  She had her IUD removed in February (about 5 weeks ago) and before having it removed, she had a lot of (non odorous) discharge which was different than anything she has ever had before.  She was having irregular periods and spotting while using the [place]. February 19-26 (last menstrual cycle).  She has abdominal pain when sleeping and states that she has to shift to a certain position in order to alleviate the pain. She denies any nausea or breast tenderness.  Patient and her husband are planning/trying to have another baby. She has 3 kids, 32, 23 and 84 year olds.  She has never had issues with pap smears in the past.Patient had a yeast infection when she was pregnant with her 32 year old.   Patient had a miscarriage in 2012 and states that she has always had a general bilateral abdominal pain which was worse when she was pregnant.  She had pain in the same area during her prior pregnancies and states that the pain gets worse occasionally on her periods.  The pain increases as  well when she is constipated.  She has never been told that she has any issues with her ovaries.  Patient has been using a small amount of Clotrimazole for her symptoms.  She would also like to lose 50 lbs before they conceive.    There are no active problems to display for this patient.  Past Medical History  Diagnosis Date  . Gastritis   . Shortness of breath     with exertion  . Cholecystolithiasis    Past Surgical History  Procedure Laterality Date  . Cholecystectomy  08/23/2012    Procedure: LAPAROSCOPIC CHOLECYSTECTOMY WITH INTRAOPERATIVE CHOLANGIOGRAM;  Surgeon: Edward Jolly, MD;  Location: [facility] OR;  Service: General;  Laterality: N/A;   No Known Allergies Prior to Admission medications   Medication Sig Start Date End Date Taking? Authorizing Provider  ciprofloxacin (CIPRO) 500 MG tablet Take 1 tablet (500 mg total) by mouth 2 (two) times daily. Patient not taking: Reported on 12/05/2015 11/04/12   Orma Flaming, MD  Homeopathic Products [facility] EARACHE RELIEF OT) Place in ear(s). Reported on 12/05/2015    Historical Provider, MD  HYDROcodone-acetaminophen (NORCO/VICODIN) 5-325 MG per tablet Take 1 tablet by mouth every 6 (six) hours as needed for pain. Patient not taking: Reported on 12/05/2015 11/04/12   Orma Flaming, MD  omeprazole (PRILOSEC) 40 MG capsule Take 1 capsule (40 mg  total) by mouth daily. Patient not taking: Reported on 12/05/2015 08/22/12   Orma Flaming, MD  ondansetron (ZOFRAN) 4 MG tablet Take 1 tablet (4 mg total) by mouth every 8 (eight) hours as needed for nausea. Patient not taking: Reported on 12/05/2015 08/24/12   Robinette Haines, NP  oxyCODONE-acetaminophen (PERCOCET/ROXICET) 5-325 MG per tablet Take 1-2 tablets by mouth every 4 (four) hours as needed. Patient not taking: Reported on 12/05/2015 08/24/12   Robinette Haines, NP   Social History   Social History  . Marital Status: Married    Spouse Name: N/A  . Number of Children: N/A  . Years of  Education: N/A   Occupational History  . Not on file.   Social History Main Topics  . Smoking status: Never Smoker   . Smokeless tobacco: Never Used  . Alcohol Use: No  . Drug Use: No  . Sexual Activity: Yes    Birth Control/ Protection: Injection   Other Topics Concern  . Not on file   Social History Narrative    Review of Systems  Constitutional: Positive for fatigue. Negative for fever and chills.  Eyes: Negative for pain, redness and itching.  Gastrointestinal: Positive for abdominal pain. Negative for nausea and vomiting.  Genitourinary: Positive for dysuria and vaginal discharge. Negative for difficulty urinating.  Musculoskeletal: Negative for neck pain and neck stiffness.  Neurological: Negative for seizures, syncope and speech difficulty.       Objective:   Physical Exam  Constitutional: She is oriented to person, place, and time. She appears well-developed and well-nourished. No distress.  HENT:  Head: Normocephalic and atraumatic.  Eyes: Pupils are equal, round, and reactive to light.  Cardiovascular: Normal rate, regular rhythm and normal heart sounds.  Exam reveals no gallop and no friction rub.   No murmur heard. Pulmonary/Chest: Effort normal and breath sounds normal. No respiratory distress. She has no wheezes. She has no rales.  Abdominal: Soft. She exhibits no distension. There is tenderness.  mildly tender to palpation in right and left lower quadrants. Bimanual exam- lower quad tenderness did not seem to correlate with her ovaries.   Genitourinary:  Uterus normal Ovaries normal Slight bit of cervical motion tenderness and uterus tenderness to palpation, but mobile, non enlarged Moderate amount of thin white mucoid vaginal discharge.  6 o clock area on the cervix was hypervascular and slightly inflamed and tender, no friability.  External labia major normal, some dryness and fissures in between labia minora and majora.    Neurological: She is alert  and oriented to person, place, and time.  Psychiatric: She has a normal mood and affect. Her behavior is normal.    Filed Vitals:   12/05/15 1204  BP: 112/80  Pulse: 62  Temp: 98.5 F (36.9 C)  TempSrc: Oral  Resp: 20  Height: 5' 3.78" (1.62 m)  Weight: 204 lb (92.534 kg)  SpO2: 98%       Results for orders placed or performed in visit on 12/05/15  TSH  Result Value Ref Range   TSH 1.54 mIU/L  POCT urine pregnancy  Result Value Ref Range   Preg Test, Ur Negative Negative  POCT Wet + KOH Prep  Result Value Ref Range   Yeast by KOH Absent Present, Absent   Yeast by wet prep Absent Present, Absent   WBC by wet prep None None, Few, Too numerous to count   Clue Cells Wet Prep HPF POC None None, Too numerous to count   Trich by  wet prep Absent Present, Absent   Bacteria Wet Prep HPF POC Moderate (A) None, Few, Too numerous to count   Epithelial Cells By [employer] Pref (UMFC) Few None, Few, Too numerous to count   RBC,UR,HPF,POC None None RBC/hpf  POCT CBC  Result Value Ref Range   WBC 8.9 4.6 - 10.2 K/uL   Lymph, poc 3.0 0.6 - 3.4   POC LYMPH PERCENT 33.6 10 - 50 %L   MID (cbc) 0.6 0 - 0.9   POC MID % 7.0 0 - 12 %M   POC Granulocyte 5.3 2 - 6.9   Granulocyte percent 59.4 37 - 80 %G   RBC 4.38 4.04 - 5.48 M/uL   Hemoglobin 13.6 12.2 - 16.2 g/dL   HCT, POC 37.0 (A) 37.7 - 47.9 %   MCV 84.5 80 - 97 fL   MCH, POC 31.1 27 - 31.2 pg   MCHC 36.7 (A) 31.8 - 35.4 g/dL   RDW, POC 12.4 %   Platelet Count, POC 232 142 - 424 K/uL   MPV 7.8 0 - 99.8 fL  POCT urinalysis dipstick  Result Value Ref Range   Color, UA yellow yellow   Clarity, UA clear clear   Glucose, UA negative negative   Bilirubin, UA negative negative   Ketones, POC UA negative negative   Spec Grav, UA 1.020    Blood, UA trace-intact (A) negative   pH, UA 7.0    Protein Ur, POC negative negative   Urobilinogen, UA 0.2    Nitrite, UA Negative Negative   Leukocytes, UA Negative Negative    Assessment & Plan:     1. Amenorrhea - pt reports lifelong hx of irreg periods though for most of her life she has either been pregnant or on birth control (OCPS, IUD) so hasn't ever been an issue - has 3 kids so no problem conceiving prior. Only had IUD removed 2 mos ago so irreg periods may cont for sev mos without concern for any sig abnml.  2. Vaginal discharge - resolved after monistat but external itching and around the introitus persists - tried clotrimazole qhs but was using a pea-sized amount only - very sparing - for the past week - so try lotrisone but warned of side effects with long-term or overuse - so stop as soon as sxs resolve and no longer than 2 wks.  3. Weight gain - tsh checked at pt's request - she is frustrated by her weight gain but has had 3 children - pt wants to loose 50lbs before she gets preg with her last child but has already had IUD removed and not using birth control  4. Cervicitis - pt has seen gyn mult times in the past sev yr inc 2 mos ago to have IUD removed.  No concern for STD on exam - husband here with pt today as he is being seen himself and they seem to have a really good relationship, denies any risk for sti and pt does not have health ins so they request to be cost-conscious in testing/work-up and meds for treatment. However, there is some erythema and tenderness of the cervix though no sig abnml vag discharge so will try to cover with doxy    Orders Placed This Encounter  Procedures  . TSH  . POCT urine pregnancy  . POCT Wet + KOH Prep  . POCT CBC  . POCT urinalysis dipstick    Meds ordered this encounter  Medications  . clotrimazole-betamethasone (LOTRISONE) cream  Sig: Apply 1 application topically 2 (two) times daily.    Dispense:  45 g    Refill:  1  . doxycycline (VIBRAMYCIN) 100 MG capsule    Sig: Take 1 capsule (100 mg total) by mouth 2 (two) times daily.    Dispense:  20 capsule    Refill:  0  . fluconazole (DIFLUCAN) 150 MG tablet    Sig: Take 1 tablet  (150 mg total) by mouth once. After antibiotic course is complete. Repeat after 3 days if needed    Dispense:  2 tablet    Refill:  0    I personally performed the services described in this documentation, which was scribed in my presence. The recorded information has been reviewed and considered, and addended by me as needed.  Delman Cheadle, MD MPH

## 2015-12-14 ENCOUNTER — Encounter: Payer: Self-pay | Admitting: Family Medicine

## 2016-05-13 ENCOUNTER — Ambulatory Visit (INDEPENDENT_AMBULATORY_CARE_PROVIDER_SITE_OTHER): Payer: Medicaid Other | Admitting: Family Medicine

## 2016-05-13 VITALS — BP 128/84 | HR 66 | Temp 98.7°F | Resp 17 | Ht 63.78 in | Wt 210.0 lb

## 2016-05-13 DIAGNOSIS — H6092 Unspecified otitis externa, left ear: Secondary | ICD-10-CM

## 2016-05-13 DIAGNOSIS — H9202 Otalgia, left ear: Secondary | ICD-10-CM

## 2016-05-13 MED ORDER — OFLOXACIN 0.3 % OT SOLN: 10.0000 [drp] | Freq: Every day | OTIC | 0 refills | Status: DC

## 2016-05-13 NOTE — Progress Notes (Signed)
By signing my name below I, Tereasa Coop, attest that this documentation has been prepared under the direction and in the presence of Wendie Agreste, MD. Electonically Signed. Tereasa Coop, Scribe 05/13/2016 at 9:21 AM  Subjective:    Patient ID: Dana Lindsey, female    DOB: [birth year], 30 y.o.   MRN: [id number]  Chief Complaint  Patient presents with  . Ear Pain    HPI Dana Lindsey is a 30 y.o. female who presents to the [facility] complaining of bilat ear pain that is worse on the left. Pain first started 6 days ago. Pt also reports that she noticed white mucous-like drainage for the past 5 days as well. Pt denies any fever. Pt also reports "blocked" hearing on her left ear. Pt has history of ear infections. Most recent ear infection was within the past 9 months. Pt also reports having nasal congestion.  Pt uses q-tips in her ear canal.   After visit, pt asked about obtaining a pregnancy test. Pt reports that her LNMP was on 04/19/16. Pt is trying to conceive and her normal period occurs every 28 days. Pt reports her at home pregnancy test was negative.   There are no active problems to display for this patient.  Past Medical History:  Diagnosis Date  . Cholecystolithiasis   . Gastritis   . Shortness of breath    with exertion   Past Surgical History:  Procedure Laterality Date  . CHOLECYSTECTOMY  08/23/2012   Procedure: LAPAROSCOPIC CHOLECYSTECTOMY WITH INTRAOPERATIVE CHOLANGIOGRAM;  Surgeon: Edward Jolly, MD;  Location: [facility] OR;  Service: General;  Laterality: N/A;   No Known Allergies Prior to Admission medications   Not on File   Social History   Social History  . Marital status: Married    Spouse name: N/A  . Number of children: N/A  . Years of education: N/A   Occupational History  . Not on file.   Social History Main Topics  . Smoking status: Never Smoker  . Smokeless tobacco: Never Used  . Alcohol use No  . Drug use: No   . Sexual activity: Yes    Birth control/ protection: Injection   Other Topics Concern  . Not on file   Social History Narrative  . No narrative on file      Review of Systems  Constitutional: Negative for fever.  HENT: Positive for congestion and ear pain (L>R).        Objective:   Physical Exam  Constitutional: She is oriented to person, place, and time. She appears well-developed and well-nourished. No distress.  HENT:  Head: Normocephalic and atraumatic.  Tender along anterior ear bilat. Dry appearance to rt canal, no discharge, no perforation. Rt TM is pearly gray. Left canal is erythematous, visualized portion of TM appears intact, swelling of left ear canal limiting vision. Also some white discharge visualized in left ear canal.   Eyes: Conjunctivae are normal. Pupils are equal, round, and reactive to light.  Neck: Neck supple.  Cardiovascular: Normal rate.   Pulmonary/Chest: Effort normal.  Musculoskeletal: Normal range of motion.  Neurological: She is alert and oriented to person, place, and time.  Skin: Skin is warm and dry.  Psychiatric: She has a normal mood and affect. Her behavior is normal.  Nursing note and vitals reviewed.    Vitals:   05/13/16 0847  BP: 128/84  Pulse: 66  Resp: 17  Temp: 98.7 F (37.1 C)  SpO2: 96%  Weight: 210 lb (95.3 kg)  Height: 5' 3.78" (1.62 m)   Mastoid nontender.      Assessment & Plan:    Dana Lindsey is a 30 y.o. female Left otitis externa - Plan: ofloxacin (FLOXIN OTIC) 0.3 % otic solution  Left ear pain - Plan: ofloxacin (FLOXIN OTIC) 0.3 % otic solution  Left otitis externa based on exam. Advised to avoid Q-tip use in the future as possible abrasion with secondary infection. No apparent rupture on TM, but unable to completely visualize TM.  -Start Floxin otic drops, avoid Q-tips, Tylenol or Motrin as needed for pain.  -Recheck in 3 days if not significantly improved, sooner if worse, as fungal otitis  externa also in differential.  Meds ordered this encounter  Medications  . ofloxacin (FLOXIN OTIC) 0.3 % otic solution    Sig: Place 10 drops into the left ear daily. For 1 week.    Dispense:  5 mL    Refill:  0   Patient Instructions   Start eardrops for suspected otitis externa or external ear infection. As we discussed, if this is not improving in next 3 days, or worsening sooner, return for recheck.   Otitis Externa Otitis externa is a bacterial or fungal infection of the outer ear canal. This is the area from the eardrum to the outside of the ear. Otitis externa is sometimes called "swimmer's ear." CAUSES  Possible causes of infection include:  Swimming in dirty water.  Moisture remaining in the ear after swimming or bathing.  Mild injury (trauma) to the ear.  Objects stuck in the ear (foreign body).  Cuts or scrapes (abrasions) on the outside of the ear. SIGNS AND SYMPTOMS  The first symptom of infection is often itching in the ear canal. Later signs and symptoms may include swelling and redness of the ear canal, ear pain, and yellowish-white fluid (pus) coming from the ear. The ear pain may be worse when pulling on the earlobe. DIAGNOSIS  Your health care provider will perform a physical exam. A sample of fluid may be taken from the ear and examined for bacteria or fungi. TREATMENT  Antibiotic ear drops are often given for 10 to 14 days. Treatment may also include pain medicine or corticosteroids to reduce itching and swelling. HOME CARE INSTRUCTIONS   Apply antibiotic ear drops to the ear canal as prescribed by your health care provider.  Take medicines only as directed by your health care provider.  If you have diabetes, follow any additional treatment instructions from your health care provider.  Keep all follow-up visits as directed by your health care provider. PREVENTION   Keep your ear dry. Use the corner of a towel to absorb water out of the ear canal after  swimming or bathing.  Avoid scratching or putting objects inside your ear. This can damage the ear canal or remove the protective wax that lines the canal. This makes it easier for bacteria and fungi to grow.  Avoid swimming in lakes, polluted water, or poorly chlorinated pools.  You may use ear drops made of rubbing alcohol and vinegar after swimming. Combine equal parts of white vinegar and alcohol in a bottle. Put 3 or 4 drops into each ear after swimming. SEEK MEDICAL CARE IF:   You have a fever.  Your ear is still red, swollen, painful, or draining pus after 3 days.  Your redness, swelling, or pain gets worse.  You have a severe headache.  You have redness, swelling, pain, or  tenderness in the area behind your ear. MAKE SURE YOU:   Understand these instructions.  Will watch your condition.  Will get help right away if you are not doing well or get worse.   This information is not intended to replace advice given to you by your health care provider. Make sure you discuss any questions you have with your health care provider.   Document Released: 08/25/2005 Document Revised: 09/15/2014 Document Reviewed: 09/11/2011 Elsevier Interactive Patient Education 2016 [employer].     IF you received an x-ray today, you will receive an invoice from [facility]. Please contact [facility] at [phone] with questions or concerns regarding your invoice.   IF you received labwork today, you will receive an invoice from [employer]. Please contact [facility] at [phone] with questions or concerns regarding your invoice.   Our billing staff will not be able to assist you with questions regarding bills from these companies.  You will be contacted with the lab results as soon as they are available. The fastest way to get your results is to activate your My Chart account. Instructions are located on the last page of this paperwork. If you  have not heard from [place] regarding the results in 2 weeks, please contact this office.        I personally performed the services described in this documentation, which was scribed in my presence. The recorded information has been reviewed and considered, and addended by me as needed.   Signed,   Merri Ray, MD [facility].  05/13/16 9:32 AM

## 2016-05-13 NOTE — Patient Instructions (Addendum)
Start eardrops for suspected otitis externa or external ear infection. As we discussed, if this is not improving in next 3 days, or worsening sooner, return for recheck.   Otitis Externa Otitis externa is a bacterial or fungal infection of the outer ear canal. This is the area from the eardrum to the outside of the ear. Otitis externa is sometimes called "swimmer's ear." CAUSES  Possible causes of infection include:  Swimming in dirty water.  Moisture remaining in the ear after swimming or bathing.  Mild injury (trauma) to the ear.  Objects stuck in the ear (foreign body).  Cuts or scrapes (abrasions) on the outside of the ear. SIGNS AND SYMPTOMS  The first symptom of infection is often itching in the ear canal. Later signs and symptoms may include swelling and redness of the ear canal, ear pain, and yellowish-white fluid (pus) coming from the ear. The ear pain may be worse when pulling on the earlobe. DIAGNOSIS  Your health care provider will perform a physical exam. A sample of fluid may be taken from the ear and examined for bacteria or fungi. TREATMENT  Antibiotic ear drops are often given for 10 to 14 days. Treatment may also include pain medicine or corticosteroids to reduce itching and swelling. HOME CARE INSTRUCTIONS   Apply antibiotic ear drops to the ear canal as prescribed by your health care provider.  Take medicines only as directed by your health care provider.  If you have diabetes, follow any additional treatment instructions from your health care provider.  Keep all follow-up visits as directed by your health care provider. PREVENTION   Keep your ear dry. Use the corner of a towel to absorb water out of the ear canal after swimming or bathing.  Avoid scratching or putting objects inside your ear. This can damage the ear canal or remove the protective wax that lines the canal. This makes it easier for bacteria and fungi to grow.  Avoid swimming in lakes, polluted  water, or poorly chlorinated pools.  You may use ear drops made of rubbing alcohol and vinegar after swimming. Combine equal parts of white vinegar and alcohol in a bottle. Put 3 or 4 drops into each ear after swimming. SEEK MEDICAL CARE IF:   You have a fever.  Your ear is still red, swollen, painful, or draining pus after 3 days.  Your redness, swelling, or pain gets worse.  You have a severe headache.  You have redness, swelling, pain, or tenderness in the area behind your ear. MAKE SURE YOU:   Understand these instructions.  Will watch your condition.  Will get help right away if you are not doing well or get worse.   This information is not intended to replace advice given to you by your health care provider. Make sure you discuss any questions you have with your health care provider.   Document Released: 08/25/2005 Document Revised: 09/15/2014 Document Reviewed: 09/11/2011 Elsevier Interactive Patient Education 2016 [employer].     IF you received an x-ray today, you will receive an invoice from [facility]. Please contact [facility] at [phone] with questions or concerns regarding your invoice.   IF you received labwork today, you will receive an invoice from [employer]. Please contact [facility] at [phone] with questions or concerns regarding your invoice.   Our billing staff will not be able to assist you with questions regarding bills from these companies.  You will be contacted with the lab results as soon as  they are available. The fastest way to get your results is to activate your My Chart account. Instructions are located on the last page of this paperwork. If you have not heard from [place] regarding the results in 2 weeks, please contact this office.

## 2016-06-06 LAB — PROCEDURE REPORT - SCANNED: Pap: NEGATIVE

## 2016-07-14 DIAGNOSIS — Q179 Congenital malformation of ear, unspecified: Secondary | ICD-10-CM | POA: Insufficient documentation

## 2016-07-15 DIAGNOSIS — H60502 Unspecified acute noninfective otitis externa, left ear: Secondary | ICD-10-CM | POA: Insufficient documentation

## 2016-07-22 DIAGNOSIS — K219 Gastro-esophageal reflux disease without esophagitis: Secondary | ICD-10-CM | POA: Insufficient documentation

## 2016-08-29 DIAGNOSIS — J111 Influenza due to unidentified influenza virus with other respiratory manifestations: Secondary | ICD-10-CM | POA: Insufficient documentation

## 2016-09-08 NOTE — L&D Delivery Note (Signed)
Patient is a 31 y.o. now [id number] s/p NSVD at [redacted]w[redacted]d, who was admitted for PROM >24hr.  Delivery Note At 5:23 PM a viable female was delivered via Vaginal, Spontaneous Delivery (Presentation: cephalic, OA).  APGAR: 9, 9; weight  .   Placenta status:intact, 3 vessel Cord  with the following complications:none .   Anesthesia:  none Episiotomy: None Lacerations: None Est. Blood Loss (mL): 50  Mom to postpartum.  Baby to Couplet care / Skin to Skin.  Head delivered OA. No nuchal cord present. Shoulder and body delivered in usual fashion. Infant with spontaneous cry, placed on mother's abdomen, dried and bulb suctioned. Cord clamped x 2 after 1-minute delay, and cut by family member. Cord blood drawn. Placenta delivered spontaneously with gentle cord traction. Fundus firm with massage and Pitocin. Perineum inspected without laceration.  Lucilla Edin. Kaeo Jacome,  MD Family Medicine Resident PGY-1 06/26/17, 5:50 PM

## 2016-11-05 ENCOUNTER — Ambulatory Visit (INDEPENDENT_AMBULATORY_CARE_PROVIDER_SITE_OTHER): Payer: Medicaid Other

## 2016-11-05 ENCOUNTER — Encounter: Payer: Self-pay | Admitting: Obstetrics

## 2016-11-05 DIAGNOSIS — Z3201 Encounter for pregnancy test, result positive: Secondary | ICD-10-CM | POA: Diagnosis not present

## 2016-11-05 DIAGNOSIS — Z3200 Encounter for pregnancy test, result unknown: Secondary | ICD-10-CM

## 2016-11-05 LAB — POCT URINE PREGNANCY: Preg Test, Ur: POSITIVE — AB

## 2016-11-05 NOTE — Progress Notes (Signed)
Patient presents for Pregnancy Test, UPT-POSITIVE. LMP 09/23/16 Patient advised to make appointment for Prenatal Care.

## 2016-12-12 ENCOUNTER — Ambulatory Visit (HOSPITAL_COMMUNITY)
Admission: EM | Admit: 2016-12-12 | Discharge: 2016-12-12 | Disposition: A | Discharge status: Home / Self Care | Payer: Medicaid Other | Attending: Internal Medicine | Admitting: Internal Medicine

## 2016-12-12 ENCOUNTER — Encounter (HOSPITAL_COMMUNITY): Payer: Self-pay | Admitting: *Deleted

## 2016-12-12 ENCOUNTER — Ambulatory Visit: Payer: Medicaid Other

## 2016-12-12 DIAGNOSIS — L02416 Cutaneous abscess of left lower limb: Secondary | ICD-10-CM

## 2016-12-12 DIAGNOSIS — L03116 Cellulitis of left lower limb: Secondary | ICD-10-CM

## 2016-12-12 MED ORDER — LIDOCAINE HCL 2 % IJ SOLN
INTRAMUSCULAR | Status: AC
  Filled 2016-12-12: qty 20

## 2016-12-12 MED ORDER — CEPHALEXIN 500 MG PO CAPS: 500.0000 mg | ORAL_CAPSULE | Freq: Two times a day (BID) | ORAL | 0 refills | Status: AC

## 2016-12-12 NOTE — ED Notes (Signed)
Provider at bedside for aspiration of abscess

## 2016-12-12 NOTE — ED Triage Notes (Signed)
Patient with mid upper thigh abscess to left leg. Mild swelling and redness noted, no drainage.

## 2016-12-12 NOTE — Discharge Instructions (Addendum)
Infected site left inner thigh was not ready to be drained today.  Prescription for [facility]in [facility] to [facility] on [facility]; this is safe in pregnancy.  Recheck for increasing pain/redness/swelling/drainage or new fever >100.5.  Wash site gently 1-2 times daily with soap/water, and apply antibiotic ointment/bandage.  Warm compresses (5-10 minutes) several times daily may speed it coming to a head and draining; ice for 5-10 minutes several times daily may help it resolve without coming to a head.

## 2016-12-12 NOTE — ED Notes (Signed)
Patient updated on POC, patient given water

## 2016-12-12 NOTE — ED Provider Notes (Addendum)
[facility]    CSN: [id number] Arrival date & time: 12/12/16  1509     History   Chief Complaint Chief Complaint  Patient presents with  . Abscess    HPI Dana Lindsey is a 31 y.o. female. Presents today with increasing pain/focal redness and swelling medial left inner thigh.  Approx [redacted] weeks pregnant.  Tried to lance site at home with a needle but unable to drain more than a drop of purulent material from site.  No fever, no malaise.   No personal history of boils, no family history of boils.     HPI  Past Medical History:  Diagnosis Date  . Cholecystolithiasis   . Gastritis   . Shortness of breath    with exertion     Past Surgical History:  Procedure Laterality Date  . CHOLECYSTECTOMY  08/23/2012   Procedure: LAPAROSCOPIC CHOLECYSTECTOMY WITH INTRAOPERATIVE CHOLANGIOGRAM;  Surgeon: Edward Jolly, MD;  Location: [facility] OR;  Service: General;  Laterality: N/A;    OB History    Gravida Para Term Preterm AB Living   1             SAB TAB Ectopic Multiple Live Births                   Home Medications    Prior to Admission medications   Medication Sig Start Date End Date Taking? Authorizing Provider  cephALEXin (KEFLEX) 500 MG capsule Take 1 capsule (500 mg total) by mouth 2 (two) times daily. 12/12/16 12/22/16  Sherlene Shams, MD    Family History Family History  Problem Relation Age of Onset  . Hyperlipidemia Mother   . Alcoholism Father   . Asthma Daughter   . Asthma Son     Social History Social History  Substance Use Topics  . Smoking status: Never Smoker  . Smokeless tobacco: Never Used  . Alcohol use No     Allergies   Patient has no known allergies.   Review of Systems Review of Systems  All other systems reviewed and are negative.    Physical Exam Triage Vital Signs ED Triage Vitals  Enc Vitals Group     BP 12/12/16 1527 112/72     Pulse Rate 12/12/16 1527 82     Resp 12/12/16 1527 14     Temp 12/12/16 1527 98  F (36.7 C)     Temp Source 12/12/16 1527 Oral     SpO2 12/12/16 1527 98 %     Weight --      Height --      Pain Score 12/12/16 1528 10     Pain Loc --    Updated Vital Signs BP 112/72 (BP Location: Left Arm)   Pulse 82   Temp 98 F (36.7 C) (Oral)   Resp 14   LMP 04/19/2016 (Approximate)   SpO2 98%   Physical Exam  Constitutional: She is oriented to person, place, and time. No distress.  HENT:  Head: Atraumatic.  Neck: Neck supple.  Cardiovascular: Normal rate.   Pulmonary/Chest: No respiratory distress.  Abdominal: She exhibits no distension.  Musculoskeletal: Normal range of motion.  Neurological: She is alert and oriented to person, place, and time.  Skin: Skin is warm and dry.  1" deep red tender swellling with couple inch zone of pale erythema around it and 3" zone of underlying firm induration.  Not pointing, not fluctuant.    Nursing note and vitals reviewed.  UC Treatments / Results   Procedures Procedures (including critical care time) Deep red area prepped with wound cleanser and infiltrated with a bleb of 2% lidocaine without epi.  Site entered with an 18Ga needle/10cc syringe and aspiration attempted without success.  Not able to express any material from site.  Will manage conservatively.    Final Clinical Impressions(s) / UC Diagnoses   Final diagnoses:  Cellulitis and abscess of left leg   Infected site left inner thigh was not ready to be drained today.  Prescription for [facility]in [facility] to [facility]; this is safe in pregnancy.  Recheck for increasing pain/redness/swelling/drainage or new fever >100.5.  Wash site gently 1-2 times daily with soap/water, and apply antibiotic ointment/bandage.  Warm compresses (5-10 minutes) several times daily may speed it coming to a head and draining; ice for 5-10 minutes several times daily may help it resolve without coming to a head.    New Prescriptions Discharge Medication List as of  12/12/2016  5:04 PM    START taking these medications   Details  cephALEXin (KEFLEX) 500 MG capsule Take 1 capsule (500 mg total) by mouth 2 (two) times daily., Starting Fri 12/12/2016, Until Mon 12/22/2016, Normal           Sherlene Shams, MD 12/12/16 2145

## 2016-12-17 ENCOUNTER — Encounter: Payer: Self-pay | Admitting: Obstetrics & Gynecology

## 2016-12-17 ENCOUNTER — Other Ambulatory Visit (HOSPITAL_COMMUNITY)
Admission: RE | Admit: 2016-12-17 | Discharge: 2016-12-17 | Disposition: A | Discharge status: Home / Self Care | Payer: Medicaid Other | Source: Ambulatory Visit | Attending: Obstetrics & Gynecology | Admitting: Obstetrics & Gynecology

## 2016-12-17 ENCOUNTER — Ambulatory Visit (INDEPENDENT_AMBULATORY_CARE_PROVIDER_SITE_OTHER): Payer: Medicaid Other | Admitting: Obstetrics & Gynecology

## 2016-12-17 DIAGNOSIS — Z3481 Encounter for supervision of other normal pregnancy, first trimester: Secondary | ICD-10-CM

## 2016-12-17 DIAGNOSIS — E669 Obesity, unspecified: Secondary | ICD-10-CM

## 2016-12-17 DIAGNOSIS — O99210 Obesity complicating pregnancy, unspecified trimester: Secondary | ICD-10-CM | POA: Insufficient documentation

## 2016-12-17 DIAGNOSIS — Z3689 Encounter for other specified antenatal screening: Secondary | ICD-10-CM | POA: Diagnosis not present

## 2016-12-17 DIAGNOSIS — Z3480 Encounter for supervision of other normal pregnancy, unspecified trimester: Secondary | ICD-10-CM | POA: Insufficient documentation

## 2016-12-17 DIAGNOSIS — O99211 Obesity complicating pregnancy, first trimester: Secondary | ICD-10-CM

## 2016-12-17 NOTE — Progress Notes (Signed)
  CLINICAL DATA:  Pregnant patient in 1st trimester pregnancy with (COMPLAINTS)  EXAM: abdominal OB ULTRASOUND   TECHNIQUE:  Abdominal ultrasound was performed for complete evaluation of the gestation as well as the maternal uterus, adnexal regions, and pelvic cul-de-sac.   FINDINGS:  Cardiac Activity: PRESENT  Heart Rate: 164 bpm.  CRL:4.83 cm LMP:09-23-16 EDC: 06-30-17  Maternal uterus/adnexae: Both ovaries are visualized and are normal.   Kathrene Alu RN, BSN

## 2016-12-17 NOTE — Progress Notes (Signed)
  Subjective:    Dana Lindsey is a 31 yo MH G4P2011 [redacted]w[redacted]d being seen today for her first obstetrical visit.  Her obstetrical history is significant for obesity. Patient does intend to breast feed. Pregnancy history fully reviewed.  Patient reports hip pain, round ligament pain.  Vitals:   12/17/16 1437  BP: 107/71  Pulse: 79  Weight: 208 lb (94.3 kg)    HISTORY: OB History  Gravida Para Term Preterm AB Living  4 2 2   1 1   SAB TAB Ectopic Multiple Live Births  1       2    # Outcome Date GA Lbr Len/2nd Weight Sex Delivery Anes PTL Lv  4 Current           3 SAB 2012          2 Term 08/01/09   7 lb 6 oz (3.345 kg) M Vag-Spont  N LIV  1 Term 07/06/06   7 lb 14 oz (3.572 kg) F Vag-Vacuum        Past Medical History:  Diagnosis Date  . Cholecystolithiasis   . Ear infection   . Gastritis   . Shortness of breath    with exertion   Past Surgical History:  Procedure Laterality Date  . CHOLECYSTECTOMY  08/23/2012   Procedure: LAPAROSCOPIC CHOLECYSTECTOMY WITH INTRAOPERATIVE CHOLANGIOGRAM;  Surgeon: Edward Jolly, MD;  Location: [facility] OR;  Service: General;  Laterality: N/A;   Family History  Problem Relation Age of Onset  . Hyperlipidemia Mother   . Alcoholism Father   . Asthma Daughter   . Asthma Son      Exam    Uterus:     Pelvic Exam:    Perineum: No Hemorrhoids   Vulva: normal   Vagina:  normal mucosa   pH:    Cervix: anteverted   Adnexa: normal adnexa   Bony Pelvis: android  System: Breast:  normal appearance, no masses or tenderness   Skin: normal coloration and turgor, no rashes    Neurologic: oriented   Extremities: normal strength, tone, and muscle mass   HEENT PERRLA   Mouth/Teeth mucous membranes moist, pharynx normal without lesions   Neck supple   Cardiovascular: regular rate and rhythm   Respiratory:  appears well, vitals normal, no respiratory distress, acyanotic, normal RR, ear and throat exam is normal, neck free of mass or  lymphadenopathy, chest clear, no wheezing, crepitations, rhonchi, normal symmetric air entry   Abdomen: soft, non-tender; bowel sounds normal; no masses,  no organomegaly   Urinary: urethral meatus normal      Assessment:    Pregnancy: [id number] Patient Active Problem List   Diagnosis Date Noted  . Supervision of other normal pregnancy, antepartum 12/17/2016  . Obesity in pregnancy 12/17/2016        Plan:     Initial labs drawn. Prenatal vitamins. Problem list reviewed and updated. Genetic Screening discussed Quad Screen: requested.  Ultrasound discussed; fetal survey: requested.  Follow up in 4 weeks. We discussed testing for Sickle cell, CF, and SMA. She declines. Emily Filbert 12/17/2016

## 2016-12-17 NOTE — Progress Notes (Signed)
C[facility]=

## 2016-12-18 LAB — OBSTETRIC PANEL, INCLUDING HIV
ANTIBODY SCREEN: NEGATIVE
Basophils Absolute: 0 10*3/uL (ref 0.0–0.2)
Basos: 0 %
EOS (ABSOLUTE): 0.1 10*3/uL (ref 0.0–0.4)
EOS: 1 %
HEMOGLOBIN: 11.5 g/dL (ref 11.1–15.9)
HIV SCREEN 4TH GENERATION: NONREACTIVE
Hematocrit: 33.3 % — ABNORMAL LOW (ref 34.0–46.6)
Hepatitis B Surface Ag: NEGATIVE
IMMATURE GRANS (ABS): 0 10*3/uL (ref 0.0–0.1)
Immature Granulocytes: 0 %
LYMPHS ABS: 2.3 10*3/uL (ref 0.7–3.1)
LYMPHS: 26 %
MCH: 29.4 pg (ref 26.6–33.0)
MCHC: 34.5 g/dL (ref 31.5–35.7)
MCV: 85 fL (ref 79–97)
MONOS ABS: 0.6 10*3/uL (ref 0.1–0.9)
Monocytes: 7 %
Neutrophils Absolute: 5.8 10*3/uL (ref 1.4–7.0)
Neutrophils: 66 %
Platelets: 232 10*3/uL (ref 150–379)
RBC: 3.91 x10E6/uL (ref 3.77–5.28)
RDW: 13.8 % (ref 12.3–15.4)
RH TYPE: POSITIVE
RPR Ser Ql: NONREACTIVE
Rubella Antibodies, IGG: <0.9 index — ABNORMAL LOW (ref >0.99)
WBC: 8.8 10*3/uL (ref 3.4–10.8)

## 2016-12-18 LAB — HEMOGLOBIN A1C
Est. average glucose Bld gHb Est-mCnc: 100 mg/dL
Hgb A1c MFr Bld: 5.1 % (ref 4.8–5.6)

## 2016-12-20 LAB — CULTURE, URINE COMPREHENSIVE

## 2016-12-22 LAB — CYTOLOGY - PAP
CHLAMYDIA, DNA PROBE: NEGATIVE
Diagnosis: NEGATIVE
HPV: NOT DETECTED
NEISSERIA GONORRHEA: NEGATIVE

## 2017-01-15 ENCOUNTER — Ambulatory Visit (INDEPENDENT_AMBULATORY_CARE_PROVIDER_SITE_OTHER): Payer: Medicaid Other | Admitting: Family Medicine

## 2017-01-15 VITALS — BP 113/60 | HR 68 | Wt 209.0 lb

## 2017-01-15 DIAGNOSIS — M9909 Segmental and somatic dysfunction of abdomen and other regions: Secondary | ICD-10-CM

## 2017-01-15 DIAGNOSIS — Z3480 Encounter for supervision of other normal pregnancy, unspecified trimester: Secondary | ICD-10-CM

## 2017-01-15 DIAGNOSIS — O26892 Other specified pregnancy related conditions, second trimester: Secondary | ICD-10-CM | POA: Diagnosis not present

## 2017-01-15 DIAGNOSIS — M549 Dorsalgia, unspecified: Secondary | ICD-10-CM | POA: Diagnosis not present

## 2017-01-15 DIAGNOSIS — O9989 Other specified diseases and conditions complicating pregnancy, childbirth and the puerperium: Secondary | ICD-10-CM

## 2017-01-15 DIAGNOSIS — Z3490 Encounter for supervision of normal pregnancy, unspecified, unspecified trimester: Secondary | ICD-10-CM

## 2017-01-15 DIAGNOSIS — Z3482 Encounter for supervision of other normal pregnancy, second trimester: Secondary | ICD-10-CM

## 2017-01-15 NOTE — Progress Notes (Signed)
   PRENATAL VISIT NOTE  Subjective:  Dana Lindsey is a 31 y.o. G4P2011 at [redacted]w[redacted]d being seen today for ongoing prenatal care.  She is currently monitored for the following issues for this low-risk pregnancy and has Supervision of other normal pregnancy, antepartum and Obesity in pregnancy on her problem list.  Patient reports backache. Pain across back, more on left side. Radiates down left thigh.  . Vag. Bleeding: None.   . Denies leaking of fluid.   The following portions of the patient's history were reviewed and updated as appropriate: allergies, current medications, past family history, past medical history, past social history, past surgical history and problem list. Problem list updated.  Objective:   Vitals:   01/15/17 0931  BP: 113/60  Pulse: 68  Weight: 209 lb (94.8 kg)    Fetal Status: Fetal Heart Rate (bpm): 157         General:  Alert, oriented and cooperative. Patient is in no acute distress.  Skin: Skin is warm and dry. No rash noted.   Cardiovascular: Normal heart rate noted  Respiratory: Normal respiratory effort, no problems with respiration noted  Abdomen: Soft, gravid, appropriate for gestational age. Pain/Pressure: Absent     Pelvic:  Cervical exam deferred        MSK: Restriction, tenderness, tissue texture changes, and paraspinal spasm in the left lumbar spine  Neuro: Moves all four extremities with no focal neurological deficit  Extremities: Normal range of motion.  Edema: None  Mental Status: Normal mood and affect. Normal behavior. Normal judgment and thought content.   OSE: Head   Cervical   Thoracic   Rib   Lumbar L5 ESRL, L3 ESRR  Sacrum  L/L  Pelvis  right ant    Assessment and Plan:  Pregnancy: G4P2011 at [redacted]w[redacted]d  1. Supervision of other normal pregnancy, antepartum FHT and FH normal  2. Prenatal care, antepartum - AFP, Quad Screen  3. Back pain affecting pregnancy in second trimester 4. Somatic dysfuncton of other region Somatic  dysfunction of lumbar, sacrum, and pelvis.  OMT done after patient permission. HVLA technique utilized. Patient tolerated procedure well.    Preterm labor symptoms and general obstetric precautions including but not limited to vaginal bleeding, contractions, leaking of fluid and fetal movement were reviewed in detail with the patient. Please refer to After Visit Summary for other counseling recommendations.  Return in about 4 weeks (around 02/12/2017).  Truett Mainland, DO

## 2017-01-22 LAB — AFP, QUAD SCREEN
DIA MOM VALUE: 0.98
DIA VALUE (EIA): 135.94 pg/mL
DSR (By Age)    1 IN: 412
DSR (SECOND TRIMESTER) 1 IN: 1392
Gestational Age: 16.3 WEEKS
MATERNAL AGE AT EDD: 33.4 a
MSAFP Mom: 0.7
MSAFP: 19.4 ng/mL
MSHCG Mom: 0.93
MSHCG: 27229 m[IU]/mL
OSB RISK: 10000
T18 (By Age): 1:1606 {titer}
Test Results:: NEGATIVE
UE3 MOM: 0.86
UE3 VALUE: 0.65 ng/mL
Weight: 209 [lb_av]

## 2017-02-02 ENCOUNTER — Ambulatory Visit (HOSPITAL_COMMUNITY): Payer: Medicaid Other

## 2017-02-03 ENCOUNTER — Other Ambulatory Visit: Payer: Self-pay | Admitting: Obstetrics & Gynecology

## 2017-02-03 ENCOUNTER — Ambulatory Visit (HOSPITAL_COMMUNITY)
Admission: RE | Admit: 2017-02-03 | Discharge: 2017-02-03 | Disposition: A | Discharge status: Home / Self Care | Payer: Medicaid Other | Source: Ambulatory Visit | Attending: Obstetrics & Gynecology | Admitting: Obstetrics & Gynecology

## 2017-02-03 DIAGNOSIS — Z3A19 19 weeks gestation of pregnancy: Secondary | ICD-10-CM | POA: Insufficient documentation

## 2017-02-03 DIAGNOSIS — Z3480 Encounter for supervision of other normal pregnancy, unspecified trimester: Secondary | ICD-10-CM

## 2017-02-03 DIAGNOSIS — Z6837 Body mass index (BMI) 37.0-37.9, adult: Secondary | ICD-10-CM | POA: Diagnosis not present

## 2017-02-03 DIAGNOSIS — O99212 Obesity complicating pregnancy, second trimester: Secondary | ICD-10-CM | POA: Insufficient documentation

## 2017-02-03 DIAGNOSIS — Z3689 Encounter for other specified antenatal screening: Secondary | ICD-10-CM

## 2017-02-12 ENCOUNTER — Ambulatory Visit (INDEPENDENT_AMBULATORY_CARE_PROVIDER_SITE_OTHER): Payer: Medicaid Other | Admitting: Family Medicine

## 2017-02-12 VITALS — BP 107/64 | HR 74 | Wt 214.0 lb

## 2017-02-12 DIAGNOSIS — Z3482 Encounter for supervision of other normal pregnancy, second trimester: Secondary | ICD-10-CM

## 2017-02-12 DIAGNOSIS — Z3480 Encounter for supervision of other normal pregnancy, unspecified trimester: Secondary | ICD-10-CM

## 2017-02-12 DIAGNOSIS — R079 Chest pain, unspecified: Secondary | ICD-10-CM

## 2017-02-12 NOTE — Progress Notes (Signed)
   PRENATAL VISIT NOTE  Subjective:  Dana Lindsey is a 31 y.o. G4P2011 at [redacted]w[redacted]d being seen today for ongoing prenatal care.  She is currently monitored for the following issues for this low-risk pregnancy and has Supervision of other normal pregnancy, antepartum and Obesity in pregnancy on her problem list.  Patient reports Complains of intermittent, sharp chest pain, mostly on left, but also occasionally on right. Nonradiating. Lasts for a couple minutes. Occurs a couple times a week. Usually occurs when shes moving around the house or cleaning, but unsure of any particular movements that cause the pain. Improves with rest. Also c/o a couple episodes of feeling lightheaded, dizziness and presyncope (not associated with chest pain). Last time, occurred while she was cooking. Hadn't eaten anything in awhile. Contractions: Not present. Vag. Bleeding: None.  Movement: Present. Denies leaking of fluid.   The following portions of the patient's history were reviewed and updated as appropriate: allergies, current medications, past family history, past medical history, past social history, past surgical history and problem list. Problem list updated.  Objective:   Vitals:   02/12/17 1001  BP: 107/64  Pulse: 74  Weight: 214 lb (97.1 kg)    Fetal Status: Fetal Heart Rate (bpm): 157   Movement: Present     General:  Alert, oriented and cooperative. Patient is in no acute distress.  Skin: Skin is warm and dry. No rash noted.   Cardiovascular: Normal heart rate noted  Respiratory: Normal respiratory effort, no problems with respiration noted  Abdomen: Soft, gravid, appropriate for gestational age. Pain/Pressure: Present     Pelvic:  Cervical exam deferred        Extremities: Normal range of motion.  Edema: Trace  Mental Status: Normal mood and affect. Normal behavior. Normal judgment and thought content.   Assessment and Plan:  Pregnancy: [id number] at [redacted]w[redacted]d  1. Supervision of other normal  pregnancy, antepartum FHT and FH normal. Presyncope likely due to low blood sugar. Recommended snacks btwn meals.  2. Chest pain, unspecified type Although unlikely to be cardiac, offered cardiology referral. Pt declined. Will keep log of when pain happens and what she is doing at the time. If persistent, advised pt to go to ED. If increases in frequency or severity, pt will call sooner.  Preterm labor symptoms and general obstetric precautions including but not limited to vaginal bleeding, contractions, leaking of fluid and fetal movement were reviewed in detail with the patient. Please refer to After Visit Summary for other counseling recommendations.  Return in about 4 weeks (around 03/12/2017).   Truett Mainland, DO

## 2017-03-13 ENCOUNTER — Encounter: Payer: Medicaid Other | Admitting: Family Medicine

## 2017-03-19 ENCOUNTER — Ambulatory Visit (INDEPENDENT_AMBULATORY_CARE_PROVIDER_SITE_OTHER): Payer: Medicaid Other | Admitting: Family Medicine

## 2017-03-19 VITALS — BP 105/56 | HR 77 | Wt 216.0 lb

## 2017-03-19 DIAGNOSIS — O99210 Obesity complicating pregnancy, unspecified trimester: Secondary | ICD-10-CM

## 2017-03-19 DIAGNOSIS — M549 Dorsalgia, unspecified: Secondary | ICD-10-CM

## 2017-03-19 DIAGNOSIS — M9909 Segmental and somatic dysfunction of abdomen and other regions: Secondary | ICD-10-CM | POA: Diagnosis not present

## 2017-03-19 DIAGNOSIS — R42 Dizziness and giddiness: Secondary | ICD-10-CM

## 2017-03-19 DIAGNOSIS — O9989 Other specified diseases and conditions complicating pregnancy, childbirth and the puerperium: Secondary | ICD-10-CM

## 2017-03-19 DIAGNOSIS — O99891 Other specified diseases and conditions complicating pregnancy: Secondary | ICD-10-CM

## 2017-03-19 DIAGNOSIS — Z3482 Encounter for supervision of other normal pregnancy, second trimester: Secondary | ICD-10-CM

## 2017-03-19 DIAGNOSIS — O26892 Other specified pregnancy related conditions, second trimester: Secondary | ICD-10-CM

## 2017-03-19 DIAGNOSIS — Z3480 Encounter for supervision of other normal pregnancy, unspecified trimester: Secondary | ICD-10-CM

## 2017-03-19 DIAGNOSIS — O99212 Obesity complicating pregnancy, second trimester: Secondary | ICD-10-CM

## 2017-03-19 NOTE — Progress Notes (Signed)
   PRENATAL VISIT NOTE  Subjective:  Dana Lindsey is a 31 y.o. G4P2011 at [redacted]w[redacted]d being seen today for ongoing prenatal care.  She is currently monitored for the following issues for this low-risk pregnancy and has Supervision of other normal pregnancy, antepartum and Obesity in pregnancy on her problem list.  Patient reports intermittent dizziness. Last for a few seconds. No vertigo. feels off balance - no orthostasis. Also having intermittent sharp left lumbar pain..  Contractions: Not present. Vag. Bleeding: None.  Movement: Present. Denies leaking of fluid.   The following portions of the patient's history were reviewed and updated as appropriate: allergies, current medications, past family history, past medical history, past social history, past surgical history and problem list. Problem list updated.  Objective:   Vitals:   03/19/17 0900  BP: (!) 105/56  Pulse: 77  Weight: 216 lb (98 kg)    Fetal Status: Fetal Heart Rate (bpm): 145   Movement: Present     General:  Alert, oriented and cooperative. Patient is in no acute distress.  Skin: Skin is warm and dry. No rash noted.   Cardiovascular: Normal heart rate noted  Respiratory: Normal respiratory effort, no problems with respiration noted  Abdomen: Soft, gravid, appropriate for gestational age. Pain/Pressure: Present     Pelvic:  Cervical exam deferred        MSK: Restriction, tenderness, tissue texture changes, and paraspinal spasm in the left lumbar spine  Neuro: Moves all four extremities with no focal neurological deficit  Extremities: Normal range of motion.  Edema: Trace  Mental Status: Normal mood and affect. Normal behavior. Normal judgment and thought content.   OSE: Head   Cervical   Thoracic   Rib   Lumbar L5 ESRL  Sacrum L/L  Pelvis Right ant innom    Assessment and Plan:  Pregnancy: G4P2011 at [redacted]w[redacted]d  1. Supervision of other normal pregnancy, antepartum FHT and FH normal  2. Obesity in  pregnancy  3. Dizziness Continue adequate hydration, frequent small meals, if worsens, may need to see cardiology, which was again offered, but pt declined.  4. Back pain affecting pregnancy in second trimester 5. Somatic dysfuncton of other region OMT done after patient permission. HVLA technique utilized. Patient tolerated procedure well.     Preterm labor symptoms and general obstetric precautions including but not limited to vaginal bleeding, contractions, leaking of fluid and fetal movement were reviewed in detail with the patient. Please refer to After Visit Summary for other counseling recommendations.  Return in about 4 weeks (around 04/16/2017) for OB f/u, 2 hr GTT.  Truett Mainland, DO

## 2017-04-09 ENCOUNTER — Other Ambulatory Visit: Payer: Self-pay | Admitting: Family Medicine

## 2017-04-09 ENCOUNTER — Ambulatory Visit (INDEPENDENT_AMBULATORY_CARE_PROVIDER_SITE_OTHER): Payer: Medicaid Other | Admitting: Family Medicine

## 2017-04-09 VITALS — BP 97/54 | HR 66 | Wt 207.0 lb

## 2017-04-09 DIAGNOSIS — R238 Other skin changes: Secondary | ICD-10-CM

## 2017-04-09 DIAGNOSIS — IMO0001 Reserved for inherently not codable concepts without codable children: Secondary | ICD-10-CM

## 2017-04-09 DIAGNOSIS — Z23 Encounter for immunization: Secondary | ICD-10-CM | POA: Diagnosis not present

## 2017-04-09 DIAGNOSIS — Z3483 Encounter for supervision of other normal pregnancy, third trimester: Secondary | ICD-10-CM

## 2017-04-09 DIAGNOSIS — Z3490 Encounter for supervision of normal pregnancy, unspecified, unspecified trimester: Secondary | ICD-10-CM

## 2017-04-09 DIAGNOSIS — Z3480 Encounter for supervision of other normal pregnancy, unspecified trimester: Secondary | ICD-10-CM

## 2017-04-09 NOTE — Progress Notes (Signed)
   PRENATAL VISIT NOTE  Subjective:  Dana Lindsey is a 31 y.o. G4P2011 at [redacted]w[redacted]d being seen today for ongoing prenatal care.  She is currently monitored for the following issues for this low-risk pregnancy and has Supervision of other normal pregnancy, antepartum and Obesity in pregnancy on her problem list.  Patient reports swelling of right eye. Started 11 days ago, not improving. Is putting warm compresses on it. Has eye doctor..  Contractions: Not present. Vag. Bleeding: None.  Movement: Present. Denies leaking of fluid.   The following portions of the patient's history were reviewed and updated as appropriate: allergies, current medications, past family history, past medical history, past social history, past surgical history and problem list. Problem list updated.  Objective:   Vitals:   04/09/17 0812  BP: (!) 97/54  Pulse: 66  Weight: 207 lb (93.9 kg)    Fetal Status:     Movement: Present     General:  Alert, oriented and cooperative. Patient is in no acute distress.  EYE Bleb right lower eye lid. No conjunctivitis.   Skin: Skin is warm and dry. No rash noted.   Cardiovascular: Normal heart rate noted  Respiratory: Normal respiratory effort, no problems with respiration noted  Abdomen: Soft, gravid, appropriate for gestational age.  Pain/Pressure: Present     Pelvic: Cervical exam deferred        Extremities: Normal range of motion.  Edema: Trace  Mental Status:  Normal mood and affect. Normal behavior. Normal judgment and thought content.   Assessment and Plan:  Pregnancy: G4P2011 at [redacted]w[redacted]d  1. Supervision of other normal pregnancy, antepartum FHT and Fh normal. 2hr GTT, Tdap today.  2. Prenatal care, antepartum - Glucose Tolerance, 2 Hours w/1 Hour - CBC - HIV antibody (with reflex) - RPR  3. Bleb Continue warm compresses. Pt to see eye doctor.  Preterm labor symptoms and general obstetric precautions including but not limited to vaginal bleeding,  contractions, leaking of fluid and fetal movement were reviewed in detail with the patient. Please refer to After Visit Summary for other counseling recommendations.  Return in about 2 weeks (around 04/23/2017) for OB f/u.   Truett Mainland, DO

## 2017-04-09 NOTE — Progress Notes (Signed)
Patient to do two hour glucose testing today. Kathrene Alu RN BSN

## 2017-04-11 LAB — HIV ANTIBODY (ROUTINE TESTING W REFLEX): HIV Screen 4th Generation wRfx: NONREACTIVE

## 2017-04-11 LAB — CBC
HEMATOCRIT: 36.7 % (ref 34.0–46.6)
Hemoglobin: 12.1 g/dL (ref 11.1–15.9)
MCH: 29.8 pg (ref 26.6–33.0)
MCHC: 33 g/dL (ref 31.5–35.7)
MCV: 90 fL (ref 79–97)
Platelets: 233 10*3/uL (ref 150–379)
RBC: 4.06 x10E6/uL (ref 3.77–5.28)
RDW: 13.4 % (ref 12.3–15.4)
WBC: 8.1 10*3/uL (ref 3.4–10.8)

## 2017-04-11 LAB — GLUCOSE TOLERANCE, 2 HOURS W/ 1HR
GLUCOSE, FASTING: 71 mg/dL (ref 65–91)
Glucose, 1 hour: 106 mg/dL (ref 65–179)
Glucose, 2 hour: 83 mg/dL (ref 65–152)

## 2017-04-11 LAB — RPR: RPR Ser Ql: NONREACTIVE

## 2017-04-23 ENCOUNTER — Encounter: Payer: Medicaid Other | Admitting: Family Medicine

## 2017-05-01 ENCOUNTER — Encounter: Payer: Medicaid Other | Admitting: Family Medicine

## 2017-05-07 ENCOUNTER — Ambulatory Visit (INDEPENDENT_AMBULATORY_CARE_PROVIDER_SITE_OTHER): Payer: Medicaid Other | Admitting: Obstetrics & Gynecology

## 2017-05-07 VITALS — BP 119/67 | HR 75 | Wt 208.0 lb

## 2017-05-07 DIAGNOSIS — Z3480 Encounter for supervision of other normal pregnancy, unspecified trimester: Secondary | ICD-10-CM

## 2017-05-07 DIAGNOSIS — O99210 Obesity complicating pregnancy, unspecified trimester: Secondary | ICD-10-CM

## 2017-05-07 NOTE — Progress Notes (Signed)
   PRENATAL VISIT NOTE  Subjective:  Dana Lindsey is a 31 y.o. H G4P2011 at [redacted]w[redacted]d being seen today for ongoing prenatal care.  She is currently monitored for the following issues for this low-risk pregnancy and has Supervision of other normal pregnancy, antepartum and Obesity in pregnancy on her problem list.  Patient reports no complaints.  Contractions: Not present. Vag. Bleeding: None.  Movement: Present. Denies leaking of fluid.   The following portions of the patient's history were reviewed and updated as appropriate: allergies, current medications, past family history, past medical history, past social history, past surgical history and problem list. Problem list updated.  Objective:   Vitals:   05/07/17 0958  BP: 119/67  Pulse: 75  Weight: 208 lb (94.3 kg)    Fetal Status:     Movement: Present     General:  Alert, oriented and cooperative. Patient is in no acute distress.  Skin: Skin is warm and dry. No rash noted.   Cardiovascular: Normal heart rate noted  Respiratory: Normal respiratory effort, no problems with respiration noted  Abdomen: Soft, gravid, appropriate for gestational age.  Pain/Pressure: Present     Pelvic: Cervical exam deferred        Extremities: Normal range of motion.  Edema: Trace  Mental Status:  Normal mood and affect. Normal behavior. Normal judgment and thought content.   Assessment and Plan:  Pregnancy: [id number] at [redacted]w[redacted]d  1. Obesity in pregnancy   2. Supervision of other normal pregnancy, antepartum   Preterm labor symptoms and general obstetric precautions including but not limited to vaginal bleeding, contractions, leaking of fluid and fetal movement were reviewed in detail with the patient. Please refer to After Visit Summary for other counseling recommendations.  Return in about 2 weeks (around 05/21/2017).   Emily Filbert, MD

## 2017-05-20 ENCOUNTER — Ambulatory Visit (INDEPENDENT_AMBULATORY_CARE_PROVIDER_SITE_OTHER): Payer: Medicaid Other | Admitting: Obstetrics & Gynecology

## 2017-05-20 VITALS — BP 112/66 | HR 72 | Wt 204.0 lb

## 2017-05-20 DIAGNOSIS — O99210 Obesity complicating pregnancy, unspecified trimester: Secondary | ICD-10-CM

## 2017-05-20 DIAGNOSIS — E669 Obesity, unspecified: Secondary | ICD-10-CM

## 2017-05-20 DIAGNOSIS — Z3480 Encounter for supervision of other normal pregnancy, unspecified trimester: Secondary | ICD-10-CM

## 2017-05-20 DIAGNOSIS — Z3483 Encounter for supervision of other normal pregnancy, third trimester: Secondary | ICD-10-CM

## 2017-05-20 DIAGNOSIS — O99213 Obesity complicating pregnancy, third trimester: Secondary | ICD-10-CM

## 2017-05-20 NOTE — Patient Instructions (Signed)
Third Trimester of Pregnancy The third trimester is from week 28 through week 40 (months 7 through 9). The third trimester is a time when the unborn baby (fetus) is growing rapidly. At the end of the ninth month, the fetus is about 20 inches in length and weighs 6-10 pounds. Body changes during your third trimester Your body will continue to go through many changes during pregnancy. The changes vary from woman to woman. During the third trimester:  Your weight will continue to increase. You can expect to gain 25-35 pounds (11-16 kg) by the end of the pregnancy.  You may begin to get stretch marks on your hips, abdomen, and breasts.  You may urinate more often because the fetus is moving lower into your pelvis and pressing on your bladder.  You may develop or continue to have heartburn. This is caused by increased hormones that slow down muscles in the digestive tract.  You may develop or continue to have constipation because increased hormones slow digestion and cause the muscles that push waste through your intestines to relax.  You may develop hemorrhoids. These are swollen veins (varicose veins) in the rectum that can itch or be painful.  You may develop swollen, bulging veins (varicose veins) in your legs.  You may have increased body aches in the pelvis, back, or thighs. This is due to weight gain and increased hormones that are relaxing your joints.  You may have changes in your hair. These can include thickening of your hair, rapid growth, and changes in texture. Some women also have hair loss during or after pregnancy, or hair that feels dry or thin. Your hair will most likely return to normal after your baby is born.  Your breasts will continue to grow and they will continue to become tender. A yellow fluid (colostrum) may leak from your breasts. This is the first milk you are producing for your baby.  Your belly button may stick out.  You may notice more swelling in your hands,  face, or ankles.  You may have increased tingling or numbness in your hands, arms, and legs. The skin on your belly may also feel numb.  You may feel short of breath because of your expanding uterus.  You may have more problems sleeping. This can be caused by the size of your belly, increased need to urinate, and an increase in your body's metabolism.  You may notice the fetus "dropping," or moving lower in your abdomen (lightening).  You may have increased vaginal discharge.  You may notice your joints feel loose and you may have pain around your pelvic bone.  What to expect at prenatal visits You will have prenatal exams every 2 weeks until week 36. Then you will have weekly prenatal exams. During a routine prenatal visit:  You will be weighed to make sure you and the baby are growing normally.  Your blood pressure will be taken.  Your abdomen will be measured to track your baby's growth.  The fetal heartbeat will be listened to.  Any test results from the previous visit will be discussed.  You may have a cervical check near your due date to see if your cervix has softened or thinned (effaced).  You will be tested for Group B streptococcus. This happens between 35 and 37 weeks.  Your health care provider may ask you:  What your birth plan is.  How you are feeling.  If you are feeling the baby move.  If you have had   any abnormal symptoms, such as leaking fluid, bleeding, severe headaches, or abdominal cramping.  If you are using any tobacco products, including cigarettes, chewing tobacco, and electronic cigarettes.  If you have any questions.  Other tests or screenings that may be performed during your third trimester include:  Blood tests that check for low iron levels (anemia).  Fetal testing to check the health, activity level, and growth of the fetus. Testing is done if you have certain medical conditions or if there are problems during the  pregnancy.  Nonstress test (NST). This test checks the health of your baby to make sure there are no signs of problems, such as the baby not getting enough oxygen. During this test, a belt is placed around your belly. The baby is made to move, and its heart rate is monitored during movement.  What is false labor? False labor is a condition in which you feel small, irregular tightenings of the muscles in the womb (contractions) that usually go away with rest, changing position, or drinking water. These are called Braxton Hicks contractions. Contractions may last for hours, days, or even weeks before true labor sets in. If contractions come at regular intervals, become more frequent, increase in intensity, or become painful, you should see your health care provider. What are the signs of labor?  Abdominal cramps.  Regular contractions that start at 10 minutes apart and become stronger and more frequent with time.  Contractions that start on the top of the uterus and spread down to the lower abdomen and back.  Increased pelvic pressure and dull back pain.  A watery or bloody mucus discharge that comes from the vagina.  Leaking of amniotic fluid. This is also known as your "water breaking." It could be a slow trickle or a gush. Let your health care provider know if it has a color or strange odor. If you have any of these signs, call your health care provider right away, even if it is before your due date. Follow these instructions at home: Medicines  Follow your health care provider's instructions regarding medicine use. Specific medicines may be either safe or unsafe to take during pregnancy.  Take a prenatal vitamin that contains at least 600 micrograms (mcg) of folic acid.  If you develop constipation, try taking a stool softener if your health care provider approves. Eating and drinking  Eat a balanced diet that includes fresh fruits and vegetables, whole grains, good sources of protein  such as meat, eggs, or tofu, and low-fat dairy. Your health care provider will help you determine the amount of weight gain that is right for you.  Avoid raw meat and uncooked cheese. These carry germs that can cause birth defects in the baby.  If you have low calcium intake from food, talk to your health care provider about whether you should take a daily calcium supplement.  Eat four or five small meals rather than three large meals a day.  Limit foods that are high in fat and processed sugars, such as fried and sweet foods.  To prevent constipation: ? Drink enough fluid to keep your urine clear or pale yellow. ? Eat foods that are high in fiber, such as fresh fruits and vegetables, whole grains, and beans. Activity  Exercise only as directed by your health care provider. Most women can continue their usual exercise routine during pregnancy. Try to exercise for 30 minutes at least 5 days a week. Stop exercising if you experience uterine contractions.  Avoid heavy   lifting.  Do not exercise in extreme heat or humidity, or at high altitudes.  Wear low-heel, comfortable shoes.  Practice good posture.  You may continue to have sex unless your health care provider tells you otherwise. Relieving pain and discomfort  Take frequent breaks and rest with your legs elevated if you have leg cramps or low back pain.  Take warm sitz baths to soothe any pain or discomfort caused by hemorrhoids. Use hemorrhoid cream if your health care provider approves.  Wear a good support bra to prevent discomfort from breast tenderness.  If you develop varicose veins: ? Wear support pantyhose or compression stockings as told by your healthcare provider. ? Elevate your feet for 15 minutes, 3-4 times a day. Prenatal care  Write down your questions. Take them to your prenatal visits.  Keep all your prenatal visits as told by your health care provider. This is important. Safety  Wear your seat belt at  all times when driving.  Make a list of emergency phone numbers, including numbers for family, friends, the hospital, and police and fire departments. General instructions  Avoid cat litter boxes and soil used by cats. These carry germs that can cause birth defects in the baby. If you have a cat, ask someone to clean the litter box for you.  Do not travel far distances unless it is absolutely necessary and only with the approval of your health care provider.  Do not use hot tubs, steam rooms, or saunas.  Do not drink alcohol.  Do not use any products that contain nicotine or tobacco, such as cigarettes and e-cigarettes. If you need help quitting, ask your health care provider.  Do not use any medicinal herbs or unprescribed drugs. These chemicals affect the formation and growth of the baby.  Do not douche or use tampons or scented sanitary pads.  Do not cross your legs for long periods of time.  To prepare for the arrival of your baby: ? Take prenatal classes to understand, practice, and ask questions about labor and delivery. ? Make a trial run to the hospital. ? Visit the hospital and tour the maternity area. ? Arrange for maternity or paternity leave through employers. ? Arrange for family and friends to take care of pets while you are in the hospital. ? Purchase a rear-facing car seat and make sure you know how to install it in your car. ? Pack your hospital bag. ? Prepare the baby's nursery. Make sure to remove all pillows and stuffed animals from the baby's crib to prevent suffocation.  Visit your dentist if you have not gone during your pregnancy. Use a soft toothbrush to brush your teeth and be gentle when you floss. Contact a health care provider if:  You are unsure if you are in labor or if your water has broken.  You become dizzy.  You have mild pelvic cramps, pelvic pressure, or nagging pain in your abdominal area.  You have lower back pain.  You have persistent  nausea, vomiting, or diarrhea.  You have an unusual or bad smelling vaginal discharge.  You have pain when you urinate. Get help right away if:  Your water breaks before 37 weeks.  You have regular contractions less than 5 minutes apart before 37 weeks.  You have a fever.  You are leaking fluid from your vagina.  You have spotting or bleeding from your vagina.  You have severe abdominal pain or cramping.  You have rapid weight loss or weight gain.    You have shortness of breath with chest pain.  You notice sudden or extreme swelling of your face, hands, ankles, feet, or legs.  Your baby makes fewer than 10 movements in 2 hours.  You have severe headaches that do not go away when you take medicine.  You have vision changes. Summary  The third trimester is from week 28 through week 40, months 7 through 9. The third trimester is a time when the unborn baby (fetus) is growing rapidly.  During the third trimester, your discomfort may increase as you and your baby continue to gain weight. You may have abdominal, leg, and back pain, sleeping problems, and an increased need to urinate.  During the third trimester your breasts will keep growing and they will continue to become tender. A yellow fluid (colostrum) may leak from your breasts. This is the first milk you are producing for your baby.  False labor is a condition in which you feel small, irregular tightenings of the muscles in the womb (contractions) that eventually go away. These are called Braxton Hicks contractions. Contractions may last for hours, days, or even weeks before true labor sets in.  Signs of labor can include: abdominal cramps; regular contractions that start at 10 minutes apart and become stronger and more frequent with time; watery or bloody mucus discharge that comes from the vagina; increased pelvic pressure and dull back pain; and leaking of amniotic fluid. This information is not intended to replace advice  given to you by your health care provider. Make sure you discuss any questions you have with your health care provider. Document Released: 08/19/2001 Document Revised: 01/31/2016 Document Reviewed: 10/26/2012 [employer]vier Interactive Patient Education  2017 [employer]  

## 2017-05-20 NOTE — Progress Notes (Signed)
   PRENATAL VISIT NOTE  Subjective:  Dana Lindsey is a 31 y.o. G4P2011 at [redacted]w[redacted]d being seen today for ongoing prenatal care.  She is currently monitored for the following issues for this low-risk pregnancy and has Supervision of other normal pregnancy, antepartum and Obesity in pregnancy on her problem list.  Patient reports no complaints.  Contractions: Irritability. Vag. Bleeding: None.  Movement: Present. Denies leaking of fluid.   The following portions of the patient's history were reviewed and updated as appropriate: allergies, current medications, past family history, past medical history, past social history, past surgical history and problem list. Problem list updated.  Objective:   Vitals:   05/20/17 1331  BP: 112/66  Pulse: 72  Weight: 204 lb (92.5 kg)    Fetal Status: Fetal Heart Rate (bpm): 154   Movement: Present     General:  Alert, oriented and cooperative. Patient is in no acute distress.  Skin: Skin is warm and dry. No rash noted.   Cardiovascular: Normal heart rate noted  Respiratory: Normal respiratory effort, no problems with respiration noted  Abdomen: Soft, gravid, appropriate for gestational age.  Pain/Pressure: Present     Pelvic: Cervical exam deferred        Extremities: Normal range of motion.  Edema: Trace  Mental Status:  Normal mood and affect. Normal behavior. Normal judgment and thought content.   Assessment and Plan:  Pregnancy: G4P2011 at [redacted]w[redacted]d  1. Supervision of other normal pregnancy, antepartum  2. Obesity in pregnancy FH appropriate  Preterm labor symptoms and general obstetric precautions including but not limited to vaginal bleeding, contractions, leaking of fluid and fetal movement were reviewed in detail with the patient. Please refer to After Visit Summary for other counseling recommendations.  Return in about 2 weeks (around 06/03/2017).   Lavonia Drafts, MD

## 2017-05-22 ENCOUNTER — Encounter: Payer: Self-pay | Admitting: Family Medicine

## 2017-06-03 ENCOUNTER — Other Ambulatory Visit (HOSPITAL_COMMUNITY)
Admission: RE | Admit: 2017-06-03 | Discharge: 2017-06-03 | Disposition: A | Discharge status: Home / Self Care | Payer: Medicaid Other | Source: Ambulatory Visit | Attending: Obstetrics & Gynecology | Admitting: Obstetrics & Gynecology

## 2017-06-03 ENCOUNTER — Ambulatory Visit (INDEPENDENT_AMBULATORY_CARE_PROVIDER_SITE_OTHER): Payer: Medicaid Other | Admitting: Obstetrics & Gynecology

## 2017-06-03 VITALS — BP 111/74 | HR 76 | Wt 208.0 lb

## 2017-06-03 DIAGNOSIS — Z3490 Encounter for supervision of normal pregnancy, unspecified, unspecified trimester: Secondary | ICD-10-CM | POA: Diagnosis not present

## 2017-06-03 DIAGNOSIS — O99213 Obesity complicating pregnancy, third trimester: Secondary | ICD-10-CM

## 2017-06-03 DIAGNOSIS — Z3483 Encounter for supervision of other normal pregnancy, third trimester: Secondary | ICD-10-CM

## 2017-06-03 DIAGNOSIS — Z3480 Encounter for supervision of other normal pregnancy, unspecified trimester: Secondary | ICD-10-CM

## 2017-06-03 DIAGNOSIS — O99210 Obesity complicating pregnancy, unspecified trimester: Secondary | ICD-10-CM

## 2017-06-03 DIAGNOSIS — Z23 Encounter for immunization: Secondary | ICD-10-CM

## 2017-06-03 DIAGNOSIS — E669 Obesity, unspecified: Secondary | ICD-10-CM

## 2017-06-03 NOTE — Patient Instructions (Signed)
Group B Streptococcus Infection During Pregnancy Group B Streptococcus (GBS) is a type of bacteria (Streptococcus agalactiae) that is often found in healthy people, commonly in the rectum, vagina, and intestines. In people who are healthy and not pregnant, the bacteria rarely cause serious illness or complications. However, women who test positive for GBS during pregnancy can pass the bacteria to their baby during childbirth, which can cause serious infection in the baby after birth. Women with GBS may also have infections during their pregnancy or immediately after childbirth, such as such as urinary tract infections (UTIs) or infections of the uterus (uterine infections). Having GBS also increases a woman's risk of complications during pregnancy, such as early (preterm) labor or delivery, miscarriage, or stillbirth. Routine testing (screening) for GBS is recommended for all pregnant women. What increases the risk? You may have a higher risk for GBS infection during pregnancy if you had one during a past pregnancy. What are the signs or symptoms? In most cases, GBS infection does not cause symptoms in pregnant women. Signs and symptoms of a possible GBS-related infection may include:  Labor starting before the 37th week of pregnancy.  A UTI or bladder infection, which may cause: ? Fever. ? Pain or burning during urination. ? Frequent urination.  Fever during labor, along with: ? Bad-smelling discharge. ? Uterine tenderness. ? Rapid heartbeat in the mother, baby, or both.  Rare but serious symptoms of a possible GBS-related infection in women include:  Blood infection (septicemia). This may cause fever, chills, or confusion.  Lung infection (pneumonia). This may cause fever, chills, cough, rapid breathing, difficulty breathing, or chest pain.  Bone, joint, skin, or soft tissue infection.  How is this diagnosed? You may be screened for GBS between week 35 and week 37 of your pregnancy. If  you have symptoms of preterm labor, you may be screened earlier. This condition is diagnosed based on lab test results from:  A swab of fluid from the vagina and rectum.  A urine sample.  How is this treated? This condition is treated with antibiotic medicine. When you go into labor, or as soon as your water breaks (your membranes rupture), you will be given antibiotics through an IV tube. Antibiotics will continue until after you give birth. If you are having a cesarean delivery, you do not need antibiotics unless your membranes have already ruptured. Follow these instructions at home:  Take over-the-counter and prescription medicines only as told by your health care provider.  Take your antibiotic medicine as told by your health care provider. Do not stop taking the antibiotic even if you start to feel better.  Keep all pre-birth (prenatal) visits and follow-up visits as told by your health care provider. This is important. Contact a health care provider if:  You have pain or burning when you urinate.  You have to urinate frequently.  You have a fever or chills.  You develop a bad-smelling vaginal discharge. Get help right away if:  Your membranes rupture.  You go into labor.  You have severe pain in your abdomen.  You have difficulty breathing.  You have chest pain. This information is not intended to replace advice given to you by your health care provider. Make sure you discuss any questions you have with your health care provider. Document Released: 12/02/2007 Document Revised: 03/21/2016 Document Reviewed: 03/20/2016 Elsevier Interactive Patient Education  2018 Elsevier Inc.  

## 2017-06-03 NOTE — Progress Notes (Signed)
   PRENATAL VISIT NOTE  Subjective:  Dana Lindsey is a 31 y.o. G4P2011 at [redacted]w[redacted]d being seen today for ongoing prenatal care.  She is currently monitored for the following issues for this low-risk pregnancy and has Supervision of other normal pregnancy, antepartum and Obesity in pregnancy on her problem list.  Patient reports occasional contractions.  Contractions: Irritability. Vag. Bleeding: None.  Movement: Present. Denies leaking of fluid.   The following portions of the patient's history were reviewed and updated as appropriate: allergies, current medications, past family history, past medical history, past social history, past surgical history and problem list. Problem list updated.  Objective:   Vitals:   06/03/17 1401  BP: 111/74  Pulse: 76  Weight: 208 lb (94.3 kg)    Fetal Status:     Movement: Present     General:  Alert, oriented and cooperative. Patient is in no acute distress.  Skin: Skin is warm and dry. No rash noted.   Cardiovascular: Normal heart rate noted  Respiratory: Normal respiratory effort, no problems with respiration noted  Abdomen: Soft, gravid, appropriate for gestational age.  Pain/Pressure: Present     Pelvic: Cervical exam performed        Extremities: Normal range of motion.  Edema: Trace  Mental Status:  Normal mood and affect. Normal behavior. Normal judgment and thought content.   Assessment and Plan:  Pregnancy: G4P2011 at [redacted]w[redacted]d  1. Prenatal care, antepartum  - Flu Vaccine QUAD 36+ mos IM (Fluarix, Quad PF)  2. Supervision of other normal pregnancy, antepartum  3. Obesity in pregnancy S>D Will obtain US for growth  Preterm labor symptoms and general obstetric precautions including but not limited to vaginal bleeding, contractions, leaking of fluid and fetal movement were reviewed in detail with the patient. Please refer to After Visit Summary for other counseling recommendations.  Return in about 1 week (around  06/10/2017).   Lavonia Drafts, MD

## 2017-06-05 LAB — GC/CHLAMYDIA PROBE AMP (~~LOC~~) NOT AT ARMC
Chlamydia: NEGATIVE
NEISSERIA GONORRHEA: NEGATIVE

## 2017-06-07 ENCOUNTER — Encounter: Payer: Self-pay | Admitting: Obstetrics & Gynecology

## 2017-06-07 DIAGNOSIS — O99820 Streptococcus B carrier state complicating pregnancy: Secondary | ICD-10-CM | POA: Insufficient documentation

## 2017-06-07 LAB — CULTURE, BETA STREP (GROUP B ONLY): Strep Gp B Culture: POSITIVE — AB

## 2017-06-08 ENCOUNTER — Telehealth: Payer: Self-pay

## 2017-06-08 NOTE — Telephone Encounter (Signed)
Patient called and made aware that she is GBS positive and will need antibiotics during delivery. Patient states understanding. Kathrene Alu RNBSN

## 2017-06-11 ENCOUNTER — Ambulatory Visit (INDEPENDENT_AMBULATORY_CARE_PROVIDER_SITE_OTHER): Payer: Medicaid Other | Admitting: Family Medicine

## 2017-06-11 VITALS — BP 109/74 | HR 74 | Wt 207.0 lb

## 2017-06-11 DIAGNOSIS — O26893 Other specified pregnancy related conditions, third trimester: Secondary | ICD-10-CM

## 2017-06-11 DIAGNOSIS — O99213 Obesity complicating pregnancy, third trimester: Secondary | ICD-10-CM

## 2017-06-11 DIAGNOSIS — E669 Obesity, unspecified: Secondary | ICD-10-CM

## 2017-06-11 DIAGNOSIS — Z3483 Encounter for supervision of other normal pregnancy, third trimester: Secondary | ICD-10-CM

## 2017-06-11 DIAGNOSIS — Z3480 Encounter for supervision of other normal pregnancy, unspecified trimester: Secondary | ICD-10-CM

## 2017-06-11 DIAGNOSIS — O99210 Obesity complicating pregnancy, unspecified trimester: Secondary | ICD-10-CM

## 2017-06-11 DIAGNOSIS — O99820 Streptococcus B carrier state complicating pregnancy: Secondary | ICD-10-CM

## 2017-06-11 DIAGNOSIS — M545 Low back pain: Secondary | ICD-10-CM

## 2017-06-11 NOTE — Progress Notes (Signed)
   PRENATAL VISIT NOTE  Subjective:  Dana Lindsey is a 31 y.o. G4P2011 at [redacted]w[redacted]d being seen today for ongoing prenatal care.  She is currently monitored for the following issues for this low-risk pregnancy and has Supervision of other normal pregnancy, antepartum; Obesity in pregnancy; and Group B Streptococcus carrier, antepartum on her problem list.  Patient reports backache.  Contractions: Irritability. Vag. Bleeding: None, Scant.  Movement: Present. Denies leaking of fluid.   The following portions of the patient's history were reviewed and updated as appropriate: allergies, current medications, past family history, past medical history, past social history, past surgical history and problem list. Problem list updated.  Objective:   Vitals:   06/11/17 0817  BP: 109/74  Pulse: 74  Weight: 207 lb (93.9 kg)    Fetal Status: Fetal Heart Rate (bpm): 146   Movement: Present     General:  Alert, oriented and cooperative. Patient is in no acute distress.  Skin: Skin is warm and dry. No rash noted.   Cardiovascular: Normal heart rate noted  Respiratory: Normal respiratory effort, no problems with respiration noted  Abdomen: Soft, gravid, appropriate for gestational age. Pain/Pressure: Present     Pelvic:  Cervical exam deferred        MSK: Restriction, tenderness, tissue texture changes, and paraspinal spasm in the lerft lumbar spine  Neuro: Moves all four extremities with no focal neurological deficit  Extremities: Normal range of motion.  Edema: Trace  Mental Status: Normal mood and affect. Normal behavior. Normal judgment and thought content.   OSE: Head   Cervical   Thoracic   Rib   Lumbar  L5 ESRL  Sacrum L/L  Pelvis Right ant    Assessment and Plan:  Pregnancy: G4P2011 at [redacted]w[redacted]d  1. Supervision of other normal pregnancy, antepartum FHT and FH normal  2. Group B Streptococcus carrier, antepartum Prophylactic abx during labor  3. Obesity in pregnancy Appropriate  wt gain  Term labor symptoms and general obstetric precautions including but not limited to vaginal bleeding, contractions, leaking of fluid and fetal movement were reviewed in detail with the patient. Please refer to After Visit Summary for other counseling recommendations.  Return in about 1 week (around 06/18/2017) for OB f/u.  Truett Mainland, DO

## 2017-06-16 ENCOUNTER — Other Ambulatory Visit: Payer: Self-pay | Admitting: Obstetrics & Gynecology

## 2017-06-16 ENCOUNTER — Ambulatory Visit (HOSPITAL_COMMUNITY)
Admission: RE | Admit: 2017-06-16 | Discharge: 2017-06-16 | Disposition: A | Discharge status: Home / Self Care | Payer: Medicaid Other | Source: Ambulatory Visit | Attending: Obstetrics & Gynecology | Admitting: Obstetrics & Gynecology

## 2017-06-16 ENCOUNTER — Encounter (HOSPITAL_COMMUNITY): Payer: Self-pay

## 2017-06-16 DIAGNOSIS — O26843 Uterine size-date discrepancy, third trimester: Secondary | ICD-10-CM | POA: Insufficient documentation

## 2017-06-16 DIAGNOSIS — O99213 Obesity complicating pregnancy, third trimester: Secondary | ICD-10-CM | POA: Diagnosis present

## 2017-06-16 DIAGNOSIS — Z3A38 38 weeks gestation of pregnancy: Secondary | ICD-10-CM

## 2017-06-16 DIAGNOSIS — O99210 Obesity complicating pregnancy, unspecified trimester: Secondary | ICD-10-CM

## 2017-06-16 DIAGNOSIS — Z3480 Encounter for supervision of other normal pregnancy, unspecified trimester: Secondary | ICD-10-CM

## 2017-06-18 ENCOUNTER — Ambulatory Visit (INDEPENDENT_AMBULATORY_CARE_PROVIDER_SITE_OTHER): Payer: Medicaid Other | Admitting: Obstetrics & Gynecology

## 2017-06-18 DIAGNOSIS — Z3480 Encounter for supervision of other normal pregnancy, unspecified trimester: Secondary | ICD-10-CM

## 2017-06-18 DIAGNOSIS — Z3483 Encounter for supervision of other normal pregnancy, third trimester: Secondary | ICD-10-CM

## 2017-06-18 NOTE — Patient Instructions (Signed)
Return to clinic for any scheduled appointments or obstetric concerns, or go to MAU for evaluation  

## 2017-06-18 NOTE — Progress Notes (Signed)
PRENATAL VISIT NOTE  Subjective:  Dana Lindsey is a 31 y.o. P8E4235 at [redacted]w[redacted]d being seen today for ongoing prenatal care.  She is currently monitored for the following issues for this low-risk pregnancy and has Supervision of other normal pregnancy, antepartum; Obesity in pregnancy; and Group B Streptococcus carrier, antepartum on her problem list.  Patient reports no complaints.  Contractions: Irritability. Vag. Bleeding: None.  Movement: Present. Denies leaking of fluid.   The following portions of the patient's history were reviewed and updated as appropriate: allergies, current medications, past family history, past medical history, past social history, past surgical history and problem list. Problem list updated.  Objective:   Vitals:   06/18/17 0822  BP: 113/78  Pulse: 72  Weight: 203 lb (92.1 kg)    Fetal Status: Fetal Heart Rate (bpm): 155 Fundal Height: 38 cm Movement: Present     General:  Alert, oriented and cooperative. Patient is in no acute distress.  Skin: Skin is warm and dry. No rash noted.   Cardiovascular: Normal heart rate noted  Respiratory: Normal respiratory effort, no problems with respiration noted  Abdomen: Soft, gravid, appropriate for gestational age.  Pain/Pressure: Present     Pelvic: Cervical exam deferred        Extremities: Normal range of motion.  Edema: Trace  Mental Status:  Normal mood and affect. Normal behavior. Normal judgment and thought content.   [place] [facility] Ob Follow Up  Result Date: 06/16/2017 ----------------------------------------------------------------------  OBSTETRICS REPORT                      (Signed Final 06/16/2017 11:48 am) ---------------------------------------------------------------------- Patient Info  ID #:       [id number]                          D.O.B.:  1985/12/26 (31 yrs)  Name:       Dana Lindsey                Visit Date: 06/16/2017 11:06 am ----------------------------------------------------------------------  Performed By  Performed By:     Berlinda Last          Ref. Address:     [address]                    [place]  Attending:        Renella Cunas MD       Location:         [facility]  Referred By:      [facility]                    for [facility]                    Healthcare ---------------------------------------------------------------------- Orders   #  Description                                 Code   1  [place] MFM OB FOLLOW UP  51884.16  ----------------------------------------------------------------------   #  Ordered By               Order #        Accession #    Episode #   1  Hoyle Sauer                  [id number]      [id number]     [id number]      HARRAWAY-SMITH  ---------------------------------------------------------------------- Indications   [redacted] weeks gestation of pregnancy                Z3A.38   Uterine size-date discrepancy, third trimester O26.843   (S >D)   Obesity complicating pregnancy, third          O99.213   trimester  ---------------------------------------------------------------------- OB History  Blood Type:            Height:  5'3"   Weight (lb):  209       BMI:  37.02  Gravidity:    4         Term:   2        Prem:   0        SAB:   0  TOP:          0       Ectopic:  1        Living: 1 ---------------------------------------------------------------------- Fetal Evaluation  Num Of Fetuses:     1  Fetal Heart         145  Rate(bpm):  Cardiac Activity:   Observed  Presentation:       Cephalic  Placenta:           Anterior, above cervical os  P. Cord Insertion:  Previously Visualized  Amniotic Fluid  AFI FV:      Subjectively within normal limits  AFI Sum(cm)     %Tile       Largest Pocket(cm)  14.41           55          5.59  RUQ(cm)       RLQ(cm)       LUQ(cm)        LLQ(cm)  5.59          2.37          3.54           2.91 ----------------------------------------------------------------------  Biometry  BPD:      89.3  mm     G. Age:  36w 1d         23  %    CI:        75.13   %    70 - 86                                                          FL/HC:      22.4   %    20.9 - 22.7  HC:      326.8  mm     G. Age:  37w 1d         14  %    HC/AC:      0.95        0.92 - 1.05  AC:  343.5  mm     G. Age:  38w 2d         74  %    FL/BPD:     82.0   %    71 - 87  FL:       73.2  mm     G. Age:  37w 4d         39  %    FL/AC:      21.3   %    20 - 24  Est. FW:    3277  gm      7 lb 4 oz     71  % ---------------------------------------------------------------------- Gestational Age  LMP:           38w 0d        Date:  09/23/16                 EDD:   06/30/17  U/S Today:     37w 2d                                        EDD:   07/05/17  Best:          38w 0d     Det. By:  LMP  (09/23/16)          EDD:   06/30/17 ---------------------------------------------------------------------- Anatomy  Cranium:               Appears normal         Aortic Arch:            Previously seen  Cavum:                 Previously seen        Ductal Arch:            Previously seen  Ventricles:            Previously seen        Diaphragm:              Appears normal  Choroid Plexus:        Previously seen        Stomach:                Appears normal, left                                                                        sided  Cerebellum:            Previously seen        Abdomen:                Appears normal  Posterior Fossa:       Previously seen        Abdominal Wall:         Previously seen  Nuchal Fold:           Previously seen        Cord Vessels:           Previously seen  Face:  Orbits and profile     Kidneys:                Appear normal                         previously seen  Lips:                  Previously seen        Bladder:                Appears normal  Thoracic:              Previously seen        Spine:                  Previously seen  Heart:                 Appears normal         Upper  Extremities:      Previously seen                         (4CH, axis, and                         situs)  RVOT:                  Appears normal         Lower Extremities:      Previously seen  LVOT:                  Appears normal  Other:  Fetus appears to be a female. Heels and 5th digit previously visualized.          Open hands previously visualized. Nasal bone previously visualized. ---------------------------------------------------------------------- Cervix Uterus Adnexa  Cervix  Not visualized (advanced GA >29wks) ---------------------------------------------------------------------- Impression  SIUP at 09+8 weeks  Cephalic presentation  Normal interval anatomy; anatomic survey complete  Normal amniotic fluid volume  Appropriate interval growth with EFW at the 71st %tile ---------------------------------------------------------------------- Recommendations  Follow-up as clinically indicated ----------------------------------------------------------------------                 Renella Cunas, MD Electronically Signed Final Report   06/16/2017 11:48 am ----------------------------------------------------------------------   Assessment and Plan:  Pregnancy: J1B1478 at [redacted]w[redacted]d  1. Supervision of other normal pregnancy, antepartum Had u/s two days ago for concern about LGA, EFW 71% (7 lb 4 oz). Appropriate fundal height today.  Patient reassured. Term labor symptoms and general obstetric precautions including but not limited to vaginal bleeding, contractions, leaking of fluid and fetal movement were reviewed in detail with the patient. Please refer to After Visit Summary for other counseling recommendations.  Return in about 1 week (around 06/25/2017) for OB Visit.   Verita Schneiders, MD

## 2017-06-25 ENCOUNTER — Ambulatory Visit (INDEPENDENT_AMBULATORY_CARE_PROVIDER_SITE_OTHER): Payer: Medicaid Other | Admitting: Family Medicine

## 2017-06-25 VITALS — BP 109/76 | HR 66 | Wt 204.0 lb

## 2017-06-25 DIAGNOSIS — O99820 Streptococcus B carrier state complicating pregnancy: Secondary | ICD-10-CM

## 2017-06-25 DIAGNOSIS — Z3480 Encounter for supervision of other normal pregnancy, unspecified trimester: Secondary | ICD-10-CM

## 2017-06-25 NOTE — Progress Notes (Signed)
   PRENATAL VISIT NOTE  Subjective:  Dana Lindsey is a 31 y.o. [id number] at [redacted]w[redacted]d being seen today for ongoing prenatal care.  She is currently monitored for the following issues for this low-risk pregnancy and has Supervision of other normal pregnancy, antepartum; Obesity in pregnancy; and Group B Streptococcus carrier, antepartum on her problem list.  Patient reports no complaints.  Contractions: Not present. Vag. Bleeding: None.  Movement: Present. Denies leaking of fluid.   The following portions of the patient's history were reviewed and updated as appropriate: allergies, current medications, past family history, past medical history, past social history, past surgical history and problem list. Problem list updated.  Objective:   Vitals:   06/25/17 0819  BP: 109/76  Pulse: 66  Weight: 204 lb (92.5 kg)    Fetal Status: Fetal Heart Rate (bpm): 148 Fundal Height: 39 cm Movement: Present     General:  Alert, oriented and cooperative. Patient is in no acute distress.  Skin: Skin is warm and dry. No rash noted.   Cardiovascular: Normal heart rate noted  Respiratory: Normal respiratory effort, no problems with respiration noted  Abdomen: Soft, gravid, appropriate for gestational age.  Pain/Pressure: Present     Pelvic: Cervical exam deferred        Extremities: Normal range of motion.  Edema: Trace  Mental Status:  Normal mood and affect. Normal behavior. Normal judgment and thought content.   Assessment and Plan:  Pregnancy: B1D1761 at [redacted]w[redacted]d  1. Supervision of other normal pregnancy, antepartum FHT and FH normal  2. Group B Streptococcus carrier, antepartum Intrapartum prophylaxis  Term labor symptoms and general obstetric precautions including but not limited to vaginal bleeding, contractions, leaking of fluid and fetal movement were reviewed in detail with the patient. Please refer to After Visit Summary for other counseling recommendations.  Return in about 1 week (around  07/02/2017) for OB f/u.   Truett Mainland, DO

## 2017-06-25 NOTE — Progress Notes (Signed)
Pt has had increase in pelvic pressure, having trouble sleeping. Pt declines cervical exam today.

## 2017-06-26 ENCOUNTER — Inpatient Hospital Stay (HOSPITAL_COMMUNITY)
Admission: AD | Admit: 2017-06-26 | Discharge: 2017-06-28 | DRG: 807 | Disposition: A | Discharge status: Home / Self Care | Payer: Medicaid Other | Source: Ambulatory Visit | Attending: Obstetrics and Gynecology | Admitting: Obstetrics and Gynecology

## 2017-06-26 ENCOUNTER — Telehealth: Payer: Self-pay | Admitting: *Deleted

## 2017-06-26 ENCOUNTER — Encounter (HOSPITAL_COMMUNITY): Payer: Self-pay

## 2017-06-26 DIAGNOSIS — Z3A39 39 weeks gestation of pregnancy: Secondary | ICD-10-CM

## 2017-06-26 DIAGNOSIS — O99210 Obesity complicating pregnancy, unspecified trimester: Secondary | ICD-10-CM

## 2017-06-26 DIAGNOSIS — O4292 Full-term premature rupture of membranes, unspecified as to length of time between rupture and onset of labor: Secondary | ICD-10-CM | POA: Diagnosis present

## 2017-06-26 DIAGNOSIS — O99820 Streptococcus B carrier state complicating pregnancy: Secondary | ICD-10-CM

## 2017-06-26 DIAGNOSIS — O99214 Obesity complicating childbirth: Secondary | ICD-10-CM | POA: Diagnosis present

## 2017-06-26 DIAGNOSIS — O99824 Streptococcus B carrier state complicating childbirth: Secondary | ICD-10-CM | POA: Diagnosis present

## 2017-06-26 DIAGNOSIS — O4290 Premature rupture of membranes, unspecified as to length of time between rupture and onset of labor, unspecified weeks of gestation: Secondary | ICD-10-CM | POA: Diagnosis present

## 2017-06-26 DIAGNOSIS — Z6836 Body mass index (BMI) 36.0-36.9, adult: Secondary | ICD-10-CM | POA: Diagnosis not present

## 2017-06-26 DIAGNOSIS — E669 Obesity, unspecified: Secondary | ICD-10-CM | POA: Diagnosis present

## 2017-06-26 DIAGNOSIS — Z3480 Encounter for supervision of other normal pregnancy, unspecified trimester: Secondary | ICD-10-CM

## 2017-06-26 LAB — CBC
HEMATOCRIT: 37.4 % (ref 36.0–46.0)
HEMOGLOBIN: 12.9 g/dL (ref 12.0–15.0)
MCH: 30.1 pg (ref 26.0–34.0)
MCHC: 34.5 g/dL (ref 30.0–36.0)
MCV: 87.4 fL (ref 78.0–100.0)
Platelets: 196 10*3/uL (ref 150–400)
RBC: 4.28 MIL/uL (ref 3.87–5.11)
RDW: 13.1 % (ref 11.5–15.5)
WBC: 9.1 10*3/uL (ref 4.0–10.5)

## 2017-06-26 LAB — TYPE AND SCREEN
ABO/RH(D): A POS
ANTIBODY SCREEN: NEGATIVE

## 2017-06-26 LAB — POCT FERN TEST

## 2017-06-26 MED ORDER — PENICILLIN G POTASSIUM 5000000 UNITS IJ SOLR
5.0000 10*6.[IU] | Freq: Once | INTRAVENOUS | Status: AC
  Administered 2017-06-26: 5 10*6.[IU] via INTRAVENOUS
  Filled 2017-06-26: qty 5

## 2017-06-26 MED ORDER — IBUPROFEN 600 MG PO TABS
600.0000 mg | ORAL_TABLET | Freq: Four times a day (QID) | ORAL | Status: DC
  Administered 2017-06-26 – 2017-06-28 (×7): 600 mg via ORAL
  Filled 2017-06-26 (×7): qty 1

## 2017-06-26 MED ORDER — OXYTOCIN 40 UNITS IN LACTATED RINGERS INFUSION - SIMPLE MED
1.0000 m[IU]/min | INTRAVENOUS | Status: DC
Start: 2017-06-26 — End: 2017-06-26
  Administered 2017-06-26: 2 m[IU]/min via INTRAVENOUS
  Filled 2017-06-26: qty 1000

## 2017-06-26 MED ORDER — ACETAMINOPHEN 325 MG PO TABS: 650.0000 mg | ORAL_TABLET | ORAL | Status: DC | PRN

## 2017-06-26 MED ORDER — SOD CITRATE-CITRIC ACID 500-334 MG/5ML PO SOLN: 30.0000 mL | ORAL | Status: DC | PRN

## 2017-06-26 MED ORDER — PRENATAL MULTIVITAMIN CH
1.0000 | ORAL_TABLET | Freq: Every day | ORAL | Status: DC
  Administered 2017-06-27: 1 via ORAL
  Filled 2017-06-26: qty 1

## 2017-06-26 MED ORDER — LACTATED RINGERS IV SOLN: 500.0000 mL | INTRAVENOUS | Status: DC | PRN

## 2017-06-26 MED ORDER — LIDOCAINE HCL (PF) 1 % IJ SOLN
30.0000 mL | INTRAMUSCULAR | Status: DC | PRN
  Filled 2017-06-26: qty 30

## 2017-06-26 MED ORDER — SENNOSIDES-DOCUSATE SODIUM 8.6-50 MG PO TABS
2.0000 | ORAL_TABLET | ORAL | Status: DC
  Administered 2017-06-26 – 2017-06-27 (×2): 2 via ORAL
  Filled 2017-06-26 (×2): qty 2

## 2017-06-26 MED ORDER — PENICILLIN G POT IN DEXTROSE 60000 UNIT/ML IV SOLN
3.0000 10*6.[IU] | INTRAVENOUS | Status: DC
  Administered 2017-06-26: 3 10*6.[IU] via INTRAVENOUS
  Filled 2017-06-26 (×3): qty 50

## 2017-06-26 MED ORDER — DIBUCAINE 1 % RE OINT: 1.0000 "application " | TOPICAL_OINTMENT | RECTAL | Status: DC | PRN

## 2017-06-26 MED ORDER — SIMETHICONE 80 MG PO CHEW: 80.0000 mg | CHEWABLE_TABLET | ORAL | Status: DC | PRN

## 2017-06-26 MED ORDER — FENTANYL CITRATE (PF) 100 MCG/2ML IJ SOLN: 50.0000 ug | INTRAMUSCULAR | Status: DC | PRN

## 2017-06-26 MED ORDER — ONDANSETRON HCL 4 MG/2ML IJ SOLN: 4.0000 mg | Freq: Four times a day (QID) | INTRAMUSCULAR | Status: DC | PRN

## 2017-06-26 MED ORDER — OXYCODONE-ACETAMINOPHEN 5-325 MG PO TABS: 2.0000 | ORAL_TABLET | ORAL | Status: DC | PRN

## 2017-06-26 MED ORDER — OXYTOCIN BOLUS FROM INFUSION: 500.0000 mL | Freq: Once | INTRAVENOUS | Status: DC

## 2017-06-26 MED ORDER — ZOLPIDEM TARTRATE 5 MG PO TABS: 5.0000 mg | ORAL_TABLET | Freq: Every evening | ORAL | Status: DC | PRN

## 2017-06-26 MED ORDER — LACTATED RINGERS IV SOLN
INTRAVENOUS | Status: DC
  Administered 2017-06-26: 12:00:00 via INTRAVENOUS

## 2017-06-26 MED ORDER — TERBUTALINE SULFATE 1 MG/ML IJ SOLN
0.2500 mg | Freq: Once | INTRAMUSCULAR | Status: DC | PRN
  Filled 2017-06-26: qty 1

## 2017-06-26 MED ORDER — DIPHENHYDRAMINE HCL 25 MG PO CAPS: 25.0000 mg | ORAL_CAPSULE | Freq: Four times a day (QID) | ORAL | Status: DC | PRN

## 2017-06-26 MED ORDER — WITCH HAZEL-GLYCERIN EX PADS: 1.0000 "application " | MEDICATED_PAD | CUTANEOUS | Status: DC | PRN

## 2017-06-26 MED ORDER — COCONUT OIL OIL: 1.0000 "application " | TOPICAL_OIL | Status: DC | PRN

## 2017-06-26 MED ORDER — OXYTOCIN 40 UNITS IN LACTATED RINGERS INFUSION - SIMPLE MED: 2.5000 [IU]/h | INTRAVENOUS | Status: DC

## 2017-06-26 MED ORDER — BENZOCAINE-MENTHOL 20-0.5 % EX AERO: 1.0000 "application " | INHALATION_SPRAY | CUTANEOUS | Status: DC | PRN

## 2017-06-26 MED ORDER — ONDANSETRON HCL 4 MG PO TABS: 4.0000 mg | ORAL_TABLET | ORAL | Status: DC | PRN

## 2017-06-26 MED ORDER — ONDANSETRON HCL 4 MG/2ML IJ SOLN: 4.0000 mg | INTRAMUSCULAR | Status: DC | PRN

## 2017-06-26 MED ORDER — OXYCODONE-ACETAMINOPHEN 5-325 MG PO TABS: 1.0000 | ORAL_TABLET | ORAL | Status: DC | PRN

## 2017-06-26 NOTE — H&P (Signed)
OBSTETRIC ADMISSION HISTORY AND PHYSICAL  Dana Lindsey is a 31 y.o. female [phone] with IUP at [redacted]w[redacted]d by LMP (09/23/16) presenting for PROM suspected since 10/16. Pt states that for the past 2-3 days she has noticed small trickling of warm liquid (unsure if vaginal discharge or urine). Today at ~0836hr she started feeling moderate amount of liquid in which she has needed 4-5 pads today. No contractions but she does report feeling increase tighten of her abdomen for 5 days with some increased lumbar pressure.  Also reporting greenish discharge for 1 week ago without itching, burning or fowl smell. Ferning + in MAU. She reports +FMs, no VB, no blurry vision, headaches or peripheral edema, and RUQ pain.  She plans on breast and bottle feeding.  She states that she has tried OCP (admits to non compliance), depo (weight gain) and IUD (tried for a year had intense abdominal cramping). IUD was last contraceptive method tried. She request condom for birth control. She received her prenatal care at [facility]  Dating: By LMP (09/23/16) --->  Estimated Date of Delivery: 06/30/17  Sono: (06/16/17)    @[redacted]w[redacted]d , CWD, normal anatomy, Cephalic presentation, 0093G, 71% EFW   Prenatal History/Complications: GBS positive PROM >24hr Obesity in pregnancy (HbA1c: 5.1) rec 12-20lb weight gain  Past Medical History: Past Medical History:  Diagnosis Date  . Cholecystolithiasis   . Ear infection   . Gastritis   . Shortness of breath    with exertion    Past Surgical History: Past Surgical History:  Procedure Laterality Date  . CHOLECYSTECTOMY  08/23/2012   Procedure: LAPAROSCOPIC CHOLECYSTECTOMY WITH INTRAOPERATIVE CHOLANGIOGRAM;  Surgeon: Edward Jolly, MD;  Location: [facility] OR;  Service: General;  Laterality: N/A;    Obstetrical History: OB History    Gravida Para Term Preterm AB Living   4 2 2   1 2    SAB TAB Ectopic Multiple Live Births   1       2      Social History: Social History   Social  History  . Marital status: Married    Spouse name: N/A  . Number of children: N/A  . Years of education: N/A   Social History Main Topics  . Smoking status: Never Smoker  . Smokeless tobacco: Never Used  . Alcohol use No  . Drug use: No  . Sexual activity: Yes   Other Topics Concern  . None   Social History Narrative  . None    Family History: Family History  Problem Relation Age of Onset  . Hyperlipidemia Mother   . Alcoholism Father   . Asthma Daughter   . Asthma Son     Allergies: No Known Allergies  Prescriptions Prior to Admission  Medication Sig Dispense Refill Last Dose  . Prenatal Vit-Fe Fumarate-FA (PRENATAL VITAMIN PO) Take 1 tablet by mouth daily.    06/25/2017 at Unknown time     Review of Systems   All systems reviewed and negative except as stated in HPI  Blood pressure 118/76, pulse 67, temperature 98.2 F (36.8 C), temperature source Oral, resp. rate 16, height 5\' 3"  (1.6 m), weight 204 lb (92.5 kg), last menstrual period 09/23/2016. General appearance: alert, cooperative and appears stated age Lungs: clear to auscultation bilaterally Heart: regular rate and rhythm Abdomen: soft, non-tender; bowel sounds normal Extremities: Homans sign is negative, no sign of DVT Presentation: cephalic Fetal monitoringBaseline: 140s bpm, Variability: Good {> 6 bpm) and Accelerations: Reactive Uterine activityFrequency: Every 3-4 minutes and Intensity: moderate  Prenatal labs: ABO, Rh: A/Positive/-- (04/11 1540) Antibody: Negative (04/11 1540) Rubella: <0.90 (04/11 1540) RPR: Non Reactive (08/02 0000)  HBsAg: Negative (04/11 1540)  HIV:   negative (04/09/17 0000) GBS:   positive 2 hr Glucola fasting 106, 1hr 71, 2hr 83 Genetic screening  Quad neg Anatomy US normal  Flu vaccine declined  Prenatal Transfer Tool  Maternal Diabetes: No Genetic Screening: Normal Maternal Ultrasounds/Referrals: Normal Fetal Ultrasounds or other Referrals:   None Maternal Substance Abuse:  No Significant Maternal Medications:  None Significant Maternal Lab Results: None  Results for orders placed or performed during the hospital encounter of 06/26/17 (from the past 24 hour(s))  POCT fern test   Collection Time: 06/26/17 11:27 AM  Result Value Ref Range   POCT Fern Test      Patient Active Problem List   Diagnosis Date Noted  . Group B Streptococcus carrier, antepartum 06/07/2017  . Supervision of other normal pregnancy, antepartum 12/17/2016  . Obesity in pregnancy 12/17/2016    Assessment/Plan:  Dana Lindsey is a 31 y.o. A6T0160 at [redacted]w[redacted]d here for PROM suspected >24hr.  #Labor: Pit started. Continue to monitor #Pain: Per request of patient #FWB: Cat 1 #ID:  GBS Pos (PCN) #MOF: Breast #MOC: condoms #Circ:  No   Ovidio Kin, MD  06/26/2017, 11:48 AM

## 2017-06-26 NOTE — MAU Note (Signed)
Report given to H. Minott MD Resident patient presents with rupture of membranes around 7:30 am however reports that for 2 days prior has felt some leaking of small amount of fluid 4 to 5 times a day.  No contractions. G4P2 [redacted]w[redacted]d fhr reassuring not reactive, was 1.5 cm yesterday in the [facility].

## 2017-06-26 NOTE — Telephone Encounter (Signed)
Pt called to office stating she thinks her water has broke. Pt was instructed to be seen as soon as possible at [facility].  Pt states understanding.

## 2017-06-26 NOTE — MAU Note (Signed)
Water broke around 7:30 am clear fluid, no contractions.

## 2017-06-26 NOTE — Anesthesia Pain Management Evaluation Note (Signed)
  [facility] Pain Management Visit Note  Patient: Dana Lindsey, 31 y.o., female  "Hello I am a member of the anesthesia team at [facility]. We have an anesthesia team available at all times to provide care throughout the hospital, including epidural management and anesthesia for C-section. I don't know your plan for the delivery whether it a natural birth, water birth, IV sedation, nitrous supplementation, doula or epidural, but we want to meet your pain goals."   1.Was your pain managed to your expectations on prior hospitalizations?   Yes   2.What is your expectation for pain management during this hospitalization?     Labor support without medications  3.How can we help you reach that goal? Pt has had 2 previous natural births and wishes the same this time. Questions answered.  Record the patient's initial score and the patient's pain goal.   Pain: 6  Pain Goal: 10 [facility] wants you to be able to say your pain was always managed very well.  Diala Waxman 06/26/2017

## 2017-06-27 LAB — RPR: RPR: NONREACTIVE

## 2017-06-27 NOTE — Discharge Instructions (Signed)
Postpartum Care After Vaginal Delivery The period of time right after you deliver your newborn is called the postpartum period. What kind of medical care will I receive?  You may continue to receive fluids and medicines through an IV tube inserted into one of your veins.  If an incision was made near your vagina (episiotomy) or if you had some vaginal tearing during delivery, cold compresses may be placed on your episiotomy or your tear. This helps to reduce pain and swelling.  You may be given a squirt bottle to use when you go to the bathroom. You may use this until you are comfortable wiping as usual. To use the squirt bottle, follow these steps: ? Before you urinate, fill the squirt bottle with warm water. Do not use hot water. ? After you urinate, while you are sitting on the toilet, use the squirt bottle to rinse the area around your urethra and vaginal opening. This rinses away any urine and blood. ? You may do this instead of wiping. As you start healing, you may use the squirt bottle before wiping yourself. Make sure to wipe gently. ? Fill the squirt bottle with clean water every time you use the bathroom.  You will be given sanitary pads to wear. How can I expect to feel?  You may not feel the need to urinate for several hours after delivery.  You will have some soreness and pain in your abdomen and vagina.  If you are breastfeeding, you may have uterine contractions every time you breastfeed for up to several weeks postpartum. Uterine contractions help your uterus return to its normal size.  It is normal to have vaginal bleeding (lochia) after delivery. The amount and appearance of lochia is often similar to a menstrual period in the first week after delivery. It will gradually decrease over the next few weeks to a dry, yellow-brown discharge. For most women, lochia stops completely by 6-8 weeks after delivery. Vaginal bleeding can vary from woman to woman.  Within the first few  days after delivery, you may have breast engorgement. This is when your breasts feel heavy, full, and uncomfortable. Your breasts may also throb and feel hard, tightly stretched, warm, and tender. After this occurs, you may have milk leaking from your breasts.Your health care provider can help you relieve discomfort due to breast engorgement. Breast engorgement should go away within a few days.  You may feel more sad or worried than normal due to hormonal changes after delivery. These feelings should not last more than a few days. If these feelings do not go away after several days, speak with your health care provider. How should I care for myself?  Tell your health care provider if you have pain or discomfort.  Drink enough water to keep your urine clear or pale yellow.  Wash your hands thoroughly with soap and water for at least 20 seconds after changing your sanitary pads, after using the toilet, and before holding or feeding your baby.  If you are not breastfeeding, avoid touching your breasts a lot. Doing this can make your breasts produce more milk.  If you become weak or lightheaded, or you feel like you might faint, ask for help before: ? Getting out of bed. ? Showering.  Change your sanitary pads frequently. Watch for any changes in your flow, such as a sudden increase in volume, a change in color, the passing of large blood clots. If you pass a blood clot from your vagina, save it  to show to your health care provider. Do not flush blood clots down the toilet without having your health care provider look at them.  Make sure that all your vaccinations are up to date. This can help protect you and your baby from getting certain diseases. You may need to have immunizations done before you leave the hospital.  If desired, talk with your health care provider about methods of family planning or birth control (contraception). How can I start bonding with my baby? Spending as much time as  possible with your baby is very important. During this time, you and your baby can get to know each other and develop a bond. Having your baby stay with you in your room (rooming in) can give you time to get to know your baby. Rooming in can also help you become comfortable caring for your baby. Breastfeeding can also help you bond with your baby. How can I plan for returning home with my baby?  Make sure that you have a car seat installed in your vehicle. ? Your car seat should be checked by a certified car seat installer to make sure that it is installed safely. ? Make sure that your baby fits into the car seat safely.  Ask your health care provider any questions you have about caring for yourself or your baby. Make sure that you are able to contact your health care provider with any questions after leaving the hospital. This information is not intended to replace advice given to you by your health care provider. Make sure you discuss any questions you have with your health care provider. Document Released: 06/22/2007 Document Revised: 01/28/2016 Document Reviewed: 07/30/2015 Elsevier Interactive Patient Education  2018 [place] (sarampin, paperas, [place] y varicela): Lo que debe saber MMRV Vaccine (Measles, Mumps, Rubella and Varicella): What You Need to Know 1. Por qu vacunarse? El sarampin, las paperas, la rubola y la varicela son enfermedades vricas que pueden tener consecuencias graves. Antes de las vacunas, estas enfermedades eran muy frecuentes en los [place], especialmente [facility]. An son comunes en muchas partes del mundo. Sarampin  El virus del sarampin causa sntomas que pueden incluir fiebre, tos, goteo nasal, ojos rojos y Air traffic controller, normalmente seguidos de una erupcin cutnea que cubre todo el cuerpo.  El sarampin puede generar infeccin de odo, diarrea e infeccin de los pulmones (neumona). Con menos frecuencia puede causar dao  cerebral o muerte. Paperas  El virus de las paperas causa fiebre, Social research officer, government de [place] y muscular, cansancio, prdida de apetito y sensibilidad de las glndulas salivales debajo de los odos en uno o ambos lados.  Las paperas pueden causar sordera, inflamacin de las membranas que recubren el cerebro y/o la mdula espinal (encefalitis o meningitis), hinchazn dolorosa de los testculos o los ovarios y, en casos raros, la [place]. Rubola (tambin conocida como sarampin alemn)  El virus de la rubola causa fiebre, dolor de garganta, dolor de cabeza, erupcin cutnea e irritacin en los ojos.  La rubola puede causar artritis en hasta la mitad de las mujeres adolescentes y [place].  Si una mujer contrae rubola durante el [place], puede sufrir un aborto espontneo o Best boy un beb con defectos graves de nacimiento. Varicela  La varicela causa una erupcin cutnea que pica y suele durar alrededor de una semana, adems de [place], cansancio, prdida de apetito y Social research officer, government de [place].  La varicela puede causar infecciones cutneas, infeccin en los pulmones (neumona), inflamacin de los vasos sanguneos, inflamacin  de las [employer] recubren el cerebro y/o la mdula espinal (encefalitis o meningitis) e infecciones de la [place], los huesos o las articulaciones. Con poca frecuencia, la varicela puede causar la muerte.  Algunas personas que contraen varicela padecen, unos aos despus, una erupcin cutnea dolorosa que se denomina culebrilla (tambin conocida como herpes zster). Estas enfermedades se contagian fcilmente de persona a Nurse, learning disability. El sarampin ni siquiera necesita Diplomatic Services operational officer personal. El sarampin puede contagiarse ingresando a una habitacin en la que estuvo una persona infectada y que dej hasta 2horas antes. Las vacunas y las tasas altas de vacunacin han hecho a estas enfermedades menos comunes en los [place]. 2. Edward Jolly SPRV La vacuna SPRV se puede aplicar a nios desde los 77mses  hasta los 121aosde edad. Se suelen recomendar dos dosis:  Primera dosis: ENiSource12 y 129mes de edAmesosis: EnNiSource y 6aos de edad  Una tercera dosis de SRP puede recomendarse en ciertas situaciones de brotes de paperas. No se conocen los riesgos de recibir la vacuna SPRV al miAutoZoneue se aplican otras vacunas. En vez de SPRV, algunos nios de entre 1273ms y 12a70aoseden recibir 2inyecciones distintas: SRP (sarampin, paperas y rubSomalia varicela. La SPRV no est autorizada para personas de 13aos o ms. Hay diferentes declaraciones de informacin sobre las vacunas SRP y contra la varicela. Su mdico puede darle ms informacin. 3. Algunas personas no deben recibir la vacuna Informe a la persona que le aplica la vacuna a su nio si este:  Tiene cualquier tipo de alergia grave potencialmente mortal. A una persona que alguna vez tuvo una reaccin alrgica potencialmente mortal despus de recibir unaArdelia Memssis de la vacuna SPRV, o tuvo una alergia grave a cualquiera de los componentes de esta vacuna, es posible que se le recomiende no vacunarse. Consulte a su mdico si desea informacin sobre los componentes de la vacuna.  Tiene un sistema inmunitario debilitado por una enfermedad (como cncer o el VIH/sndrome de inmunodeficiencia adquirida [SIDA]) o tratamientos mdicos (como radiacin, inmunoterapia, corticoesteroides o quimioterapia).  Tiene antecedentes de convulsiones o los tiene uno de los padres, un hermano o unaExelandTiene uno de los padPrairietownn hermano o una hermana con antecedentes de problemas en el sistema inmunitario.  Tuvo alguna vez una enfermedad que los hace amoratarse o sangrar fcilmente.  Est embarazada o puede estarlo. La vacuna SPRV no debe aplicarse durante el embarazo.  Est tomando salicilato (como aspirina). Las perPersonal assistantnsumir salicilato durante 6semanas despus de obtener una vacuna que contenga varicela.  Ha  recibido recientemente una transfusin de sanPetoskeyproductos derivados de la Herbalists posible que se le aconseje posponer la vacunacin SPRV de su nio por 3me67m o ms.  Tiene tuberculosis.  Ha recibido cualquier otra vacuna durante las ltimas 4semanas. Es posible que las vacunas elaboradas con microbios vivos que se administran en fechas muy cercanas entre s pierdan su eficacia.  Tiene maleTree surgeoneral. Si su nio tiene una enfermedad leve, como un resfro, probablemente pueda recibir la vacuna. Si su nio sufre una enfermedad moderada o grave, probablemente deba esperar hasta que se recupere para poder vacunarse. El mdico puede darle recomendaciones al respecto.  4. Riesgos de una Mexicoccin a la vacuna Con cualquier medicamento, incluidas las vacunas, existe la posibilidad de que hayaDeere & Companytos suelen ser leves y desaArmed forces operational officer s solos, pero tambin es posible que se presenten reacciones graves. Recibir la vacuna SPRV es mucho ms seguro  que contraer paperas, sarampin, rubola o varicela. La [employer] de los nios a los que se les aplica la vacuna SPRV no tienen ningn problema. Luego de vacunarse con SPRV, el nio puede experimentar: [employer] menores:  Dolor en el brazo por la inyeccin  [employer]  Enrojecimiento o erupcin cutnea Mudlogger de la inyeccin  Hinchazn de los ganglios en los pmulos o el cuello Si estos eventos ocurren, suelen comenzar despus de 2semanas de la inyeccin. Son menos frecuentes despus de la segunda dosis. Eventos moderados:  Convulsiones (nistagmo o mirada perdida) usualmente asociadas con fiebre ? El riesgo de estas convulsiones es mayor luego de recibir SPRV que luego de recibir vacunas contra la varicela y SRP por separado cuando se dan como primera dosis de la serie. Su mdico puede aconsejarlo [employer] para su nio.  Conteo temporal de plaquetas bajo, lo que puede causar sangrado o hematomas inusuales  Infeccin en  los pulmones (neumona) o de las membranas que recubren el cerebro y la mdula espinal (encefalitis, meningitis)  Erupcin cutnea en todo el cuerpo Si su nio tiene erupcin cutnea luego de [place], puede estar relacionada con el componente de varicela de la vacuna. Un nio que presenta una erupcin cutnea luego de vacunarse con la vacuna SPRV puede transmitir el virus de la vacuna contra la varicela a una persona que no est protegida. Aunque esto sucede con muy poca frecuencia, los nios con una erupcin cutnea deben mantenerse alejados de las personas con sistemas inmunitarios debilitados y de los bebs no vacunados hasta que la erupcin desaparezca. Hable con su mdico para obtener ms informacin. Con poca frecuencia se han informado eventos graves despus de la aplicacin de la vacuna SRP, que tambin podran ocurrir despus de la SPRV. Estos incluyen los siguientes:  Sordera  Convulsiones crnicas, coma, disminucin de la conciencia  [place]ao cerebral  Otras cosas que podran ocurrir despus de la aplicacin de esta vacuna:  Las personas a veces se desmayan despus de procedimientos mdicos, incluida la vacunacin. Permanecer sentado o recostado durante 75mnutos puede ayudar a eMerrill Lynchy las lesiones causadas por las cadas. Informe al mdico si se siente mareado, tiene cambios en la visin o zumbidos en los odos.  Algunas pWarden/rangerde hombros que puede ser ms grave y durar ms tiempo que el dolor que suele aparecer despus de una inyeccin. Esto sucede con muy poca frecuencia.  Cualquier medicamento puede causar una reaccin alrgica grave. Dichas reacciones a una vacuna se calculan que son menos de 1en un milln de dosis y se producen entre unos minutos y unas horas despus de la vacunacin. Al igual que con cualquier mHalliburton Company existe una probabilidad muy remota de que una vacuna cause una lesin grave o la mSche[place] Se controla permanentemente la seguridad  de las vacunas. Para obtener ms informacin, visite: [email or web address].aguilar.[email or web address]. Qu pasa si se presenta un problema grave? A qu signos debo estar atento?  Observe todo lo que le preocupe, como signos de una reaccin alrgica grave, fiebre muy alta o comportamiento fuera de lo normal. Los signos de una reaccin alrgica grave pueden incluir ronchas, hinchazn de la cara y la garganta, dificultad para respirar, latidos cardacos acelerados, mareos y debilidad. Generalmente, estos comenzaran entre unos pocos minutos y algunas horas despus de la vacunacin. Qu debo hacer?  Si cree que se trata de una reaccin alrgica grave o de otra emergencia que no puede esperar, llame al 911 y dirjase al hospital ms cercano. Si  no, comunquese con su mdico. Despus, la reaccin debe informarse al Sistema de informe de eventos adversos de vacunas (Vaccine Adverse Event Reporting System, VAERS). El mdico debe presentar este informe, o bien puede hacerlo usted mismo a travs del sitio web de VAERS, en [email or web address].[email or web address], o llamando al [phone]. VAERS no brinda recomendaciones mdicas. 6. [place] Compensacin de Daos por [place] de Compensacin de Daos por Clinical biochemist (National Vaccine Injury Compensation Program, VICP) es un programa federal que fue creado para Patent examiner a las personas que puedan haber sufrido daos al recibir ciertas vacunas. Aquellas personas que consideren que han sufrido un dao como consecuencia de una vacuna y [place] saber ms acerca del programa y de cmo presentar un reclamo, pueden llamar al [phone] o visitar el sitio web del VICP en GoldCloset.com.ee. Hay un lmite de tiempo para presentar un reclamo de compensacin. 7. Cmo puedo obtener ms informacin?  Pregntele al mdico. Este puede darle el prospecto de la vacuna o recomendarle otras fuentes de informacin.  Comunquese con el servicio de salud de su localidad  o su estado.  Comunquese con los Centros para el Control y la Prevencin de Probation officer for Disease Control and Prevention, CDC): ? Llame al 925-689-1673 (1-800-CDC-INFO) o ? Visite el sitio Biomedical engineer [email or web address] Declaracin de informacin sobre la vacuna (Vaccine Information Statement, VIS) de los CDC, de la vacuna SPRV (08/09/2017) Esta informacin no tiene Marine scientist el consejo del mdico. Asegrese de hacerle al mdico cualquier pregunta que tenga. Document Released: 08/14/2011 Document Revised: 11/12/2016 Document Reviewed: 11/12/2016 Elsevier Interactive Patient Education  2018 [employer]. MMRV Vaccine (Measles, Mumps, Rubella and Varicella): What You Need to Know 1. Why get vaccinated? Measles, mumps, rubella, and varicella are viral diseases that can have serious consequences. Before vaccines, these diseases were very common in the [place], especially among children. They are still common in many parts of the world. Measles  Measles virus causes symptoms that can include fever, cough, runny nose, and red, watery eyes, commonly followed by a rash that covers the whole body.  Measles can lead to ear infections, diarrhea, and infection of the lungs (pneumonia). Rarely, measles can cause brain damage or death. Mumps  Mumps virus causes fever, headache, muscle aches, tiredness, loss of appetite, and swollen and tender salivary glands under the ears on one or both sides.  Mumps can lead to deafness, swelling of the brain and/or spinal cord covering (encephalitis or meningitis), painful swelling of the testicles or ovaries, and very rarely, death. Rubella (also known as [place] Measles)  Rubella virus causes fever, sore throat, rash, headache, and eye irritation.  Rubella can cause arthritis in up to half of teenage and adult women.  If a woman gets rubella while she is pregnant, she could have a miscarriage or her baby could be born with  serious birth defects. Varicella (also known as Chickenpox)  Chickenpox causes an itchy rash that usually lasts about a week, in addition to fever, tiredness, loss of appetite, and headache.  Chickenpox can lead to skin infections, infection of the lungs (pneumonia), inflammation of blood vessels, swelling of the brain and/or spinal cord covering (encephalitis or meningitis) and infections of the blood, bones, or joints. Rarely, varicella can cause death.  Some people who get chickenpox get a painful rash called shingles (also known as herpes zoster) years later. These diseases can easily spread from person to person. Measles doesn't even require personal contact. You can get measles by  entering a room that a person with measles left up to 2 hours before. Vaccines and high rates of vaccination have made these diseases much less common in the [place]. 2. MMRV Vaccine MMRV vaccine may be given to children 12 months through 36 years of age. Two doses are usually recommended:  First dose: 12 through 89 months of age  Second dose: 4 through 34 years of age  A third dose of MMR might be recommended in certain mumps outbreak situations. There are no known risks to getting MMRV vaccine at the same time as other vaccines. Instead of MMRV, some children 12 months through 69 years of age might get 2 separate shots: MMR (measles, mumps and rubella) and chickenpox (varicella). MMRV is not licensed for people 20 years of age or older. There are separate Vaccine Information Statements for MMR and chickenpox vaccines. Your health care provider can give you more information. 3. Some people should not get this vaccine Tell the person who is giving your child the vaccine if your child:  Has any severe, life-threatening allergies. A person who has ever had a life-threatening allergic reaction after a dose of MMRV vaccine, or has a severe allergy to any part of this vaccine, may be advised not to be  vaccinated. Ask your health care provider if you want information about vaccine components.  Has a weakened immune system due to disease (such as cancer or HIV/AIDS) or medical treatments (such as radiation, immunotherapy, steroids, or chemotherapy).  Has a history of seizures, or has a parent, brother, or sister with a history of seizures.  Has a parent, brother, or sister with a history of immune system problems.  Has ever had a condition that makes them bruise or bleed easily.  Is pregnant or might be pregnant. MMRV vaccine should not be given during pregnancy.  Is taking salicylates (such as aspirin). People should avoid using salicylates for 6 weeks after getting a vaccine that contains varicella.  Has recently had a blood transfusion or received other blood products. You might be advised to postpone MMRV vaccination of your child for at least 3 months.  Has tuberculosis.  Has gotten any other vaccines in the past 4 weeks. Live vaccines given too close together might not work as well.  Is not feeling well. If your child has a mild illness, such as a cold, he or she can probably get the vaccine today. If your child is moderately or severely ill, you should probably wait until the child recovers. Your doctor can advise you.  4. Risks of a vaccine reaction With any medicine, including vaccines, there is a chance of reactions. These are usually mild and go away on their own, but serious reactions are also possible. Getting MMRV vaccine is much safer than getting measles, mumps, rubella, or chickenpox disease. Most children who get MMRV vaccine do not have any problems with it. After MMRV vaccination, a child might experience: Minor events:  Sore arm from the injection  Fever  Redness or rash at the injection site  Swelling of glands in the cheeks or neck If these events happen, they usually begin within 2 weeks after the shot. They occur less often after the second dose. Moderate  events:  Seizure (jerking or staring) often associated with fever ? The risk of these seizures is higher after MMRV than after separate MMR and chickenpox vaccines when given as the first dose of the series. Your doctor can advise you about the appropriate vaccines  for your child.  Temporary low platelet count, which can cause unusual bleeding or bruising  Infection of the lungs (pneumonia) or the brain and spinal cord coverings (encephalitis, meningitis)  Rash all over the body If your child gets a rash after vaccination, it might be related to the varicella component of the vaccine. A child who has a rash after MMRV vaccination might be able to spread the varicella vaccine virus to an unprotected person. Even though this happens very rarely, children who develop a rash should stay away from people with weakened immune systems and unvaccinated infants until the rash goes away. Talk with your health care provider to learn more. Severe events have very rarely been reported following MMR vaccination, and might also happen after MMRV. These include:  Deafness  Long-term seizures, coma, lowered consciousness  Brain damage  Other things that could happen after this vaccine:  People sometimes faint after medical procedures, including vaccination. Sitting or lying down for about 15 minutes can help prevent fainting and injuries caused by a fall. Tell your provider if you feel dizzy or have vision changes or ringing in the ears.  Some people get shoulder pain that can be more severe and longer-lasting than routine soreness that can follow injections. This happens very rarely.  Any medication can cause a severe allergic reaction. Such reactions to a vaccine are estimated at about 1 in a million doses, and would happen a few minutes to a few hours after the vaccination. As with any medicine, there is a very remote chance of a vaccine causing a serious injury or death. The safety of vaccines is  always being monitored. For more information, visit: http://www.aguilar.org/ 5. What if there is a serious problem? What should I look for?  Look for anything that concerns you, such as signs of a severe allergic reaction, very high fever, or unusual behavior. Signs of a severe allergic reaction can include hives, swelling of the face and throat, difficulty breathing, a fast heartbeat, dizziness, and weakness. These would usually start a few minutes to a few hours after the vaccination. What should I do?  If you think it is a severe allergic reaction or other emergency that can't wait, call 9-1-1 and get to the nearest hospital. Otherwise, call your health care provider. Afterward, the reaction should be reported to the Vaccine Adverse Event Reporting System (VAERS). Your doctor should file this report, or you can do it yourself through the VAERS web site atwww.vaers.SamedayNews.es, or by calling [phone]. VAERS does not give medical advice. 6. The National Vaccine Injury Compensation Program The [employer] Vaccine Injury Compensation Program (VICP) is a federal program that was created to compensate people who may have been injured by certain vaccines. Persons who believe they may have been injured by a vaccine can learn about the program and about filing a claim by calling (782)270-4605 or visiting the [place] website at GoldCloset.com.ee. There is a time limit to file a claim for compensation. 7. How can I learn more?  Ask your healthcare provider. He or she can give you the vaccine package insert or suggest other sources of information.  Call your local or state health department.  Contact the Centers for Disease Control and Prevention (CDC): ? Call 438-508-2164 (1-800-CDC-INFO) or ? Visit CDCs website SharpAnalyst.uy CDC Vaccine Information Statement (VIS) MMRV Vaccine (10/20/2016) This information is not intended to replace advice given to you by your health care  provider. Make sure you discuss any questions you have with your health  care provider. Document Released: 08/14/2011 Document Revised: 11/04/2016 Document Reviewed: 11/04/2016 Elsevier Interactive Patient Education  [employer].

## 2017-06-27 NOTE — Progress Notes (Signed)
POSTPARTUM PROGRESS NOTE  Post Partum Day 1  Subjective:  Dana Lindsey is a 31 y.o. [id number] s/p SVD at [redacted]w[redacted]d. PT report headache and lower abdominal pain that was alleviated by ibuprofen. Pt denies problems with ambulating, voiding or po intake.  She denies nausea or vomiting.  Pain is moderately controlled.  She has had flatus. She has had bowel movement.  Lochia Moderate. She reports having to change her pad 4 times over night. With first change containing large clot with smaller clots with subsequent pads. No dizziness reported.   Objective: Blood pressure 103/74, pulse (!) 55, temperature 97.8 F (36.6 C), resp. rate 16, height 5\' 3"  (1.6 m), weight 204 lb (92.5 kg), last menstrual period 09/23/2016, unknown if currently breastfeeding.  Physical Exam:  General: alert, cooperative and no distress Chest: no respiratory distress Heart:regular rate, distal pulses intact Abdomen: soft, nontender,  Uterine Fundus: firm, appropriately tender DVT Evaluation: No calf swelling or tenderness Extremities: no edema Skin: warm, dry   Recent Labs  06/26/17 1215  HGB 12.9  HCT 37.4    Assessment/Plan: Dana Lindsey is a 31 y.o. O1H0865 s/p SVD at [redacted]w[redacted]d. Although she has had moderate lochia with headaches over night. Her BP remains well controlled at  [phone] systolic/ 29-52 diastolic in last 84XL without blurry vision or RUQ pain.    PPD# 1 - Doing well Contraception: condoms  Feeding: breast and bottle Dispo: Plan for discharge tomorrow   LOS: 1 day   [place] 06/27/2017, 9:17 AM   OB FELLOW POSTPARTUM PROGRESS NOTE ATTESTATION I have seen and examined this patient and agree with above documentation in the resident's note.  Doing well. VSS, BP wnl  Gailen Shelter, MD OB Fellow 10:46 PM

## 2017-06-27 NOTE — Lactation Note (Signed)
This note was copied from a baby's chart. Lactation Consultation Note  Patient Name: Boy Azalyn Sliwa ELFYB'O Date: 06/27/2017 Reason for consult: Initial assessment   P3, Baby 24 hours old.  Mother states she wants to breastfeed and offer formula. Mother states her first child breastfed for 1 month.  She had difficulty and 2nd child was breastfed for 2 months. She states baby is latching better than siblings. Reviewed hand expression and mother was able to express drops.  Noted abrasion on tip of R nipple. Suggest applying ebm for abrasion. Reviewed basics.  Mom encouraged to feed baby 8-12 times/24 hours and with feeding cues.  Suggest breastfeeding before offering formula to help establish her milk supply. Mom encouraged to feed baby 8-12 times/24 hours and with feeding cues.  Mom made aware of O/P services, breastfeeding support groups, community resources, and our phone # for post-discharge questions.      Maternal Data Has patient been taught Hand Expression?: Yes Does the patient have breastfeeding experience prior to this delivery?: Yes  Feeding    LATCH Score                   Interventions    Lactation Tools Discussed/Used     Consult Status Consult Status: Follow-up Date: 06/28/17 Follow-up type: In-patient    Vivianne Master [facility] 06/27/2017, 2:38 PM

## 2017-06-28 MED ORDER — IBUPROFEN 600 MG PO TABS: 600.0000 mg | ORAL_TABLET | Freq: Four times a day (QID) | ORAL | 0 refills | Status: DC | PRN

## 2017-06-28 MED ORDER — SENNOSIDES-DOCUSATE SODIUM 8.6-50 MG PO TABS: 2.0000 | ORAL_TABLET | Freq: Every evening | ORAL | 0 refills | Status: DC | PRN

## 2017-06-28 NOTE — Discharge Summary (Signed)
OB Discharge Summary     Patient Name: Dana Lindsey DOB: 1985-12-01 MRN: [id number]  Date of admission: 06/26/2017 Delivering MD: Ovidio Kin   Date of discharge: 06/28/2017  Admitting diagnosis: WATER BROKE Intrauterine pregnancy: [redacted]w[redacted]d     Secondary diagnosis:  Principal Problem:   SVD (spontaneous vaginal delivery) Active Problems:   Group B Streptococcus carrier, antepartum   PROM (premature rupture of membranes)  Additional problems: Obesity in pregnancy     Discharge diagnosis: Term Pregnancy Delivered and PROM                                                                                                Post partum procedures:none  Augmentation: Pitocin and Cytotec  Complications: None  Hospital course:  Onset of Labor With Vaginal Delivery     31 y.o. yo [id number] at [redacted]w[redacted]d was admitted in Active Labor on 06/26/2017. Patient had an uncomplicated labor course as follows:  Membrane Rupture Time/Date: 8:20 AM ,06/26/2017   Intrapartum Procedures: Episiotomy: None [1]                                         Lacerations:  None [1]  Patient had a delivery of a Viable infant. 06/26/2017  Information for the patient's newborn:  Dana Lindsey [[id number]]  Delivery Method: Vaginal, Spontaneous Delivery (Filed from Delivery Summary)    Pateint had an uncomplicated postpartum course.  She is ambulating, tolerating a regular diet, passing flatus, and urinating well. Patient is discharged home in stable condition on 06/28/17.   Physical exam  Vitals:   06/26/17 2150 06/27/17 0430 06/27/17 1700 06/28/17 0614  BP: 111/70 103/74 101/74 99/60  Pulse: 65 (!) 55 (!) 57 (!) 59  Resp: 16 16 20 16   Temp: 98.2 F (36.8 C) 97.8 F (36.6 C) 98.7 F (37.1 C)   TempSrc:      Weight:      Height:       General: alert, cooperative and no distress Lochia: appropriate Uterine Fundus: firm Incision: n/a DVT Evaluation: No evidence of DVT seen on physical exam. No  cords or calf tenderness. Labs: Lab Results  Component Value Date   WBC 9.1 06/26/2017   HGB 12.9 06/26/2017   HCT 37.4 06/26/2017   MCV 87.4 06/26/2017   PLT 196 06/26/2017   CMP Latest Ref Rng & Units 08/22/2012  Glucose 70 - 99 mg/dL 100(H)  BUN 6 - 23 mg/dL 10  Creatinine 0.50 - 1.10 mg/dL 0.55  Sodium 135 - 145 mEq/L 135  Potassium 3.5 - 5.1 mEq/L 3.7  Chloride 96 - 112 mEq/L 99  CO2 19 - 32 mEq/L 23  Calcium 8.4 - 10.5 mg/dL 9.7  Total Protein 6.0 - 8.3 g/dL 8.0  Total Bilirubin 0.3 - 1.2 mg/dL 0.4  Alkaline Phos 39 - 117 U/L 67  AST 0 - 37 U/L 15  ALT 0 - 35 U/L 17    Discharge instruction: per After Visit Summary and "Baby and Me  Booklet".  After visit meds:  Allergies as of 06/28/2017   No Known Allergies     Medication List    TAKE these medications   ibuprofen 600 MG tablet Commonly known as:  ADVIL,MOTRIN Take 1 tablet (600 mg total) by mouth every 6 (six) hours as needed.   PRENATAL VITAMIN PO Take 1 tablet by mouth daily.   senna-docusate 8.6-50 MG tablet Commonly known as:  Senokot-S Take 2 tablets by mouth at bedtime as needed for mild constipation.       Diet: carb modified diet  Activity: Advance as tolerated. Pelvic rest for 6 weeks.   Outpatient follow up:6 weeks Follow up Appt:Future Appointments Date Time Provider [place]  07/02/2017 8:15 AM Truett Mainland, DO [facility] None   Follow up Visit:No Follow-up on file.  Postpartum contraception: Condoms  Newborn Data: Live born female  Birth Weight: 7 lb 4.8 oz (3310 g) APGAR: 9, 9  Newborn Delivery   Birth date/time:  06/26/2017 17:23:00 Delivery type:  Vaginal, Spontaneous Delivery      Baby Feeding: Breast Disposition:home with mother   06/28/2017 Nuala Alpha, DO  I was present at delivery and agree with above assessment

## 2017-07-02 ENCOUNTER — Encounter: Payer: Medicaid Other | Admitting: Family Medicine

## 2017-07-24 ENCOUNTER — Ambulatory Visit (INDEPENDENT_AMBULATORY_CARE_PROVIDER_SITE_OTHER): Payer: Medicaid Other | Admitting: Family Medicine

## 2017-07-24 VITALS — Wt 190.0 lb

## 2017-07-24 DIAGNOSIS — R102 Pelvic and perineal pain: Secondary | ICD-10-CM

## 2017-07-24 NOTE — Progress Notes (Signed)
Post Partum Exam  Coby Antrobus Neto is a 31 y.o. [phone] female who presents for a postpartum visit. She is 4 weeks postpartum following a spontaneous vaginal delivery. I have fully reviewed the prenatal and intrapartum course. The delivery was at 39.3 gestational weeks.  Anesthesia: none. Postpartum course has been doing well. Baby's course has been doing well. Baby is feeding by both breast and bottle - Gerber. Bleeding thin lochia. Bowel function is normal. Bladder function is normal Patient is not sexually active. Contraception method is abstinence.  Postpartum depression screening:neg, score 0.  The following portions of the patient's history were reviewed and updated as appropriate: allergies, current medications, past family history, past medical history, past social history, past surgical history and problem list.  Review of Systems Pertinent items noted in HPI and remainder of comprehensive ROS otherwise negative.    Objective:  unknown if currently breastfeeding.  General:  alert, cooperative and no distress  Lungs: clear to auscultation bilaterally  Heart:  regular rate and rhythm, S1, S2 normal, no murmur, click, rub or gallop  Abdomen: Soft, mild tenderness in the right lower quadrant.  No rebound or guarding.  No masses palpated.         Assessment:    normal postpartum exam. Pap smear not done at today's visit.   Plan:   1. Contraception: condoms 2.  If pain increases or becomes more severe, patient will contact.  If still present over the next month, return to the office 3. Follow up in: 1 year or as needed.

## 2018-05-20 ENCOUNTER — Encounter: Payer: Self-pay | Admitting: Family Medicine

## 2018-05-20 ENCOUNTER — Other Ambulatory Visit (HOSPITAL_COMMUNITY)
Admission: RE | Admit: 2018-05-20 | Discharge: 2018-05-20 | Disposition: A | Discharge status: Home / Self Care | Payer: Medicaid Other | Source: Ambulatory Visit | Attending: Family Medicine | Admitting: Family Medicine

## 2018-05-20 ENCOUNTER — Ambulatory Visit (INDEPENDENT_AMBULATORY_CARE_PROVIDER_SITE_OTHER): Payer: Medicaid Other | Admitting: Family Medicine

## 2018-05-20 VITALS — BP 106/60 | HR 69 | Wt 194.0 lb

## 2018-05-20 DIAGNOSIS — Z3481 Encounter for supervision of other normal pregnancy, first trimester: Secondary | ICD-10-CM | POA: Diagnosis not present

## 2018-05-20 DIAGNOSIS — Z3480 Encounter for supervision of other normal pregnancy, unspecified trimester: Secondary | ICD-10-CM

## 2018-05-20 DIAGNOSIS — Z3491 Encounter for supervision of normal pregnancy, unspecified, first trimester: Secondary | ICD-10-CM

## 2018-05-20 DIAGNOSIS — O99211 Obesity complicating pregnancy, first trimester: Secondary | ICD-10-CM

## 2018-05-20 DIAGNOSIS — Z3490 Encounter for supervision of normal pregnancy, unspecified, unspecified trimester: Secondary | ICD-10-CM

## 2018-05-20 DIAGNOSIS — O99210 Obesity complicating pregnancy, unspecified trimester: Secondary | ICD-10-CM

## 2018-05-20 NOTE — Progress Notes (Signed)
Patient thinks LMP 03-06-18 and then had spotting for four days in 07- 25-19. Last pap 12-17-16 WNL. No history of abnormal pap smear. Kathrene Alu RN  DATING AND VIABILITY SONOGRAM   Dana Lindsey is a 32 y.o. year old G28P3013 with LMP Patient's last menstrual period was 03/06/2018 (approximate). which would correlate to  [redacted]w[redacted]d weeks gestation.  She has regular menstrual cycles.   She is here today for a confirmatory initial sonogram.    GESTATION: SINGLETON     FETAL ACTIVITY:          Heart rate       160          The fetus is active.   ADNEXA: The ovaries are normal.   GESTATIONAL AGE AND  BIOMETRICS:  Gestational criteria: Estimated Date of Delivery: 12/11/18 by LMP now at [redacted]w[redacted]d  Previous Scans:0      CROWN RUMP LENGTH          3.60 Cm         10-3 weeks                                                                               AVERAGE EGA(BY THIS SCAN): 10-3 weeks  WORKING EDD( LMP ):  12/11/2018     Kathrene Alu 05/20/2018 11:02 AM

## 2018-05-20 NOTE — Progress Notes (Signed)
Subjective:  Dana Lindsey is a [id number] [redacted]w[redacted]d being seen today for her first obstetrical visit.  Her obstetrical history is significant for obesity. Patient does intend to breast feed. FOB is husband and is involved. Pregnancy history fully reviewed.  Patient reports nausea.  BP 106/60   Pulse 69   Wt 194 lb (88 kg)   LMP 03/06/2018 (Approximate)   BMI 34.37 kg/m   HISTORY: OB History  Gravida Para Term Preterm AB Living  5 3 3   1 3   SAB TAB Ectopic Multiple Live Births  1     0 3    # Outcome Date GA Lbr Len/2nd Weight Sex Delivery Anes PTL Lv  5 Current           4 Term 06/26/17 [redacted]w[redacted]d 02:47 / 00:06 7 lb 4.8 oz (3.31 kg) M Vag-Spont None  LIV  3 SAB 2012          2 Term 08/01/09   7 lb 6 oz (3.345 kg) M Vag-Spont  N LIV  1 Term 07/06/06   7 lb 14 oz (3.572 kg) F Vag-Vacuum       Past Medical History:  Diagnosis Date  . Cholecystolithiasis   . Ear infection   . Gastritis   . Shortness of breath    with exertion    Past Surgical History:  Procedure Laterality Date  . CHOLECYSTECTOMY  08/23/2012   Procedure: LAPAROSCOPIC CHOLECYSTECTOMY WITH INTRAOPERATIVE CHOLANGIOGRAM;  Surgeon: Edward Jolly, MD;  Location: [facility] OR;  Service: General;  Laterality: N/A;    Family History  Problem Relation Age of Onset  . Hyperlipidemia Mother   . Alcoholism Father   . Asthma Daughter   . Asthma Son      Exam    Uterus:     Pelvic Exam:    Perineum: No Hemorrhoids, Normal Perineum   Vulva: normal, Bartholin's, Urethra, Skene's normal   Vagina:  normal mucosa   Cervix: multiparous appearance, no cervical motion tenderness and no lesions   Adnexa: normal adnexa and no mass, fullness, tenderness   Bony Pelvis: gynecoid  System: Breast:  normal appearance, no masses or tenderness, Inspection negative, No nipple retraction or dimpling, No nipple discharge or bleeding, No axillary or supraclavicular adenopathy   Skin: normal coloration and turgor, no rashes    Neurologic: gait normal; reflexes normal and symmetric   Extremities: normal strength, tone, and muscle mass, no deformities, no erythema, induration, or nodules   HEENT PERRLA and extra ocular movement intact   Mouth/Teeth mucous membranes moist, pharynx normal without lesions   Neck supple and no masses   Cardiovascular: regular rate and rhythm, no murmurs or gallops   Respiratory:  appears well, vitals normal, no respiratory distress, acyanotic, normal RR, ear and throat exam is normal, neck free of mass or lymphadenopathy, chest clear, no wheezing, crepitations, rhonchi, normal symmetric air entry   Abdomen: soft, non-tender; bowel sounds normal; no masses,  no organomegaly   Urinary: urethral meatus normal      Assessment:    Pregnancy: Y1O1751 Patient Active Problem List   Diagnosis Date Noted  . Supervision of other normal pregnancy, antepartum 12/17/2016      Plan:   1. Supervision of other normal pregnancy, antepartum Panorama desired Will get [place] around 20 weeks No other concerns - Obstetric Panel, Including HIV - Urine Culture - Cervicovaginal ancillary only - HgB A1c - Genetic Screening     Problem list reviewed and updated. 75% of  30 min visit spent on counseling and coordination of care.     Truett Mainland 05/20/2018

## 2018-05-21 LAB — CERVICOVAGINAL ANCILLARY ONLY
Chlamydia: NEGATIVE
Neisseria Gonorrhea: NEGATIVE

## 2018-05-22 ENCOUNTER — Encounter: Payer: Self-pay | Admitting: Family Medicine

## 2018-05-22 DIAGNOSIS — Z2839 Other underimmunization status: Secondary | ICD-10-CM | POA: Insufficient documentation

## 2018-05-22 DIAGNOSIS — Z283 Underimmunization status: Secondary | ICD-10-CM | POA: Insufficient documentation

## 2018-05-22 DIAGNOSIS — O9989 Other specified diseases and conditions complicating pregnancy, childbirth and the puerperium: Secondary | ICD-10-CM

## 2018-05-22 LAB — OBSTETRIC PANEL, INCLUDING HIV
Antibody Screen: NEGATIVE
BASOS ABS: 0 10*3/uL (ref 0.0–0.2)
Basos: 0 %
EOS (ABSOLUTE): 0.2 10*3/uL (ref 0.0–0.4)
EOS: 2 %
HEMOGLOBIN: 11.8 g/dL (ref 11.1–15.9)
HEP B S AG: NEGATIVE
HIV Screen 4th Generation wRfx: NONREACTIVE
Hematocrit: 35.2 % (ref 34.0–46.6)
IMMATURE GRANULOCYTES: 0 %
Immature Grans (Abs): 0 10*3/uL (ref 0.0–0.1)
LYMPHS ABS: 2.2 10*3/uL (ref 0.7–3.1)
Lymphs: 28 %
MCH: 29.3 pg (ref 26.6–33.0)
MCHC: 33.5 g/dL (ref 31.5–35.7)
MCV: 87 fL (ref 79–97)
MONOCYTES: 6 %
Monocytes Absolute: 0.4 10*3/uL (ref 0.1–0.9)
NEUTROS PCT: 64 %
Neutrophils Absolute: 4.9 10*3/uL (ref 1.4–7.0)
PLATELETS: 246 10*3/uL (ref 150–450)
RBC: 4.03 x10E6/uL (ref 3.77–5.28)
RDW: 13.8 % (ref 12.3–15.4)
RH TYPE: POSITIVE
RPR: NONREACTIVE
WBC: 7.8 10*3/uL (ref 3.4–10.8)

## 2018-05-22 LAB — HEMOGLOBIN A1C
Est. average glucose Bld gHb Est-mCnc: 94 mg/dL
HEMOGLOBIN A1C: 4.9 % (ref 4.8–5.6)

## 2018-05-26 ENCOUNTER — Other Ambulatory Visit: Payer: Self-pay

## 2018-06-17 ENCOUNTER — Ambulatory Visit (INDEPENDENT_AMBULATORY_CARE_PROVIDER_SITE_OTHER): Payer: Medicaid Other | Admitting: Family Medicine

## 2018-06-17 VITALS — BP 111/65 | HR 73 | Wt 197.0 lb

## 2018-06-17 DIAGNOSIS — Z3480 Encounter for supervision of other normal pregnancy, unspecified trimester: Secondary | ICD-10-CM

## 2018-06-17 DIAGNOSIS — Z3482 Encounter for supervision of other normal pregnancy, second trimester: Secondary | ICD-10-CM

## 2018-06-17 NOTE — Progress Notes (Signed)
   PRENATAL VISIT NOTE  Subjective:  Dana Lindsey is a 32 y.o. J5T0177 at [redacted]w[redacted]d being seen today for ongoing prenatal care.  She is currently monitored for the following issues for this low-risk pregnancy and has Supervision of other normal pregnancy, antepartum; Obesity in pregnancy; and Rubella non-immune status, antepartum on their problem list.  Patient reports no complaints.  Contractions: Not present. Vag. Bleeding: None.  Movement: Absent. Denies leaking of fluid.   The following portions of the patient's history were reviewed and updated as appropriate: allergies, current medications, past family history, past medical history, past social history, past surgical history and problem list. Problem list updated.  Objective:   Vitals:   06/17/18 1013  BP: 111/65  Pulse: 73  Weight: 197 lb 0.6 oz (89.4 kg)    Fetal Status: Fetal Heart Rate (bpm): 152   Movement: Absent     General:  Alert, oriented and cooperative. Patient is in no acute distress.  Skin: Skin is warm and dry. No rash noted.   Cardiovascular: Normal heart rate noted  Respiratory: Normal respiratory effort, no problems with respiration noted  Abdomen: Soft, gravid, appropriate for gestational age.  Pain/Pressure: Present     Pelvic: Cervical exam deferred        Extremities: Normal range of motion.  Edema: None  Mental Status: Normal mood and affect. Normal behavior. Normal judgment and thought content.   Assessment and Plan:  Pregnancy: L3J0300 at [redacted]w[redacted]d  1. Supervision of other normal pregnancy, antepartum FHT and Fh normal - [place] MFM OB DETAIL +14 WK; Future  Preterm labor symptoms and general obstetric precautions including but not limited to vaginal bleeding, contractions, leaking of fluid and fetal movement were reviewed in detail with the patient. Please refer to After Visit Summary for other counseling recommendations.  Return in about 4 weeks (around 07/15/2018).  Future Appointments  Date Time  Provider [place]  07/19/2018  9:45 AM Truett Mainland, DO [facility] None    Truett Mainland, DO

## 2018-07-05 ENCOUNTER — Encounter (HOSPITAL_COMMUNITY): Payer: Self-pay

## 2018-07-13 ENCOUNTER — Ambulatory Visit (HOSPITAL_COMMUNITY)
Admission: RE | Admit: 2018-07-13 | Discharge: 2018-07-13 | Disposition: A | Discharge status: Home / Self Care | Payer: Medicaid Other | Source: Ambulatory Visit | Attending: Family Medicine | Admitting: Family Medicine

## 2018-07-13 DIAGNOSIS — Z3482 Encounter for supervision of other normal pregnancy, second trimester: Secondary | ICD-10-CM | POA: Insufficient documentation

## 2018-07-13 DIAGNOSIS — Z3480 Encounter for supervision of other normal pregnancy, unspecified trimester: Secondary | ICD-10-CM

## 2018-07-13 DIAGNOSIS — Z363 Encounter for antenatal screening for malformations: Secondary | ICD-10-CM | POA: Diagnosis not present

## 2018-07-13 DIAGNOSIS — O99212 Obesity complicating pregnancy, second trimester: Secondary | ICD-10-CM

## 2018-07-13 DIAGNOSIS — Z3A18 18 weeks gestation of pregnancy: Secondary | ICD-10-CM | POA: Diagnosis not present

## 2018-07-19 ENCOUNTER — Encounter: Payer: Medicaid Other | Admitting: Family Medicine

## 2018-07-23 ENCOUNTER — Ambulatory Visit (INDEPENDENT_AMBULATORY_CARE_PROVIDER_SITE_OTHER): Payer: Medicaid Other | Admitting: Family Medicine

## 2018-07-23 VITALS — BP 108/66 | HR 82 | Wt 198.0 lb

## 2018-07-23 DIAGNOSIS — Z23 Encounter for immunization: Secondary | ICD-10-CM

## 2018-07-23 DIAGNOSIS — O99212 Obesity complicating pregnancy, second trimester: Secondary | ICD-10-CM

## 2018-07-23 DIAGNOSIS — Z3A19 19 weeks gestation of pregnancy: Secondary | ICD-10-CM

## 2018-07-23 DIAGNOSIS — O99210 Obesity complicating pregnancy, unspecified trimester: Secondary | ICD-10-CM

## 2018-07-23 DIAGNOSIS — Z3480 Encounter for supervision of other normal pregnancy, unspecified trimester: Secondary | ICD-10-CM

## 2018-07-23 DIAGNOSIS — Z283 Underimmunization status: Secondary | ICD-10-CM

## 2018-07-23 DIAGNOSIS — O09899 Supervision of other high risk pregnancies, unspecified trimester: Secondary | ICD-10-CM

## 2018-07-23 DIAGNOSIS — O9989 Other specified diseases and conditions complicating pregnancy, childbirth and the puerperium: Secondary | ICD-10-CM

## 2018-07-23 NOTE — Progress Notes (Signed)
   PRENATAL VISIT NOTE  Subjective:  Dana Lindsey is a 32 y.o. E0C1448 at 68w6dbeing seen today for ongoing prenatal care.  She is currently monitored for the following issues for this low-risk pregnancy and has Supervision of other normal pregnancy, antepartum; Obesity in pregnancy; and Rubella non-immune status, antepartum on their problem list.  Patient reports no complaints.  Contractions: Not present. Vag. Bleeding: None.  Movement: Present. Denies leaking of fluid.   The following portions of the patient's history were reviewed and updated as appropriate: allergies, current medications, past family history, past medical history, past social history, past surgical history and problem list. Problem list updated.  Objective:   Vitals:   07/23/18 0944  BP: 108/66  Pulse: 82  Weight: 198 lb (89.8 kg)    Fetal Status: Fetal Heart Rate (bpm): 148 Fundal Height: 21 cm Movement: Present     General:  Alert, oriented and cooperative. Patient is in no acute distress.  Skin: Skin is warm and dry. No rash noted.   Cardiovascular: Normal heart rate noted  Respiratory: Normal respiratory effort, no problems with respiration noted  Abdomen: Soft, gravid, appropriate for gestational age.  Pain/Pressure: Absent     Pelvic: Cervical exam deferred        Extremities: Normal range of motion.  Edema: None  Mental Status: Normal mood and affect. Normal behavior. Normal judgment and thought content.   Assessment and Plan:  Pregnancy: GJ8H6314at 133w6d1. Supervision of other normal pregnancy, antepartum FHT and FH normal. 2hr GTT next visit  2. Rubella non-immune status, antepartum MMR post delivery  3. Obesity in pregnancy Weight gain appropriate.  Preterm labor symptoms and general obstetric precautions including but not limited to vaginal bleeding, contractions, leaking of fluid and fetal movement were reviewed in detail with the patient. Please refer to After Visit Summary for other  counseling recommendations.  No follow-ups on file.  No future appointments.  JaTruett MainlandDO

## 2018-08-26 ENCOUNTER — Ambulatory Visit (INDEPENDENT_AMBULATORY_CARE_PROVIDER_SITE_OTHER): Payer: Medicaid Other | Admitting: Family Medicine

## 2018-08-26 ENCOUNTER — Encounter: Payer: Self-pay | Admitting: Family Medicine

## 2018-08-26 VITALS — BP 98/57 | HR 87 | Wt 203.0 lb

## 2018-08-26 DIAGNOSIS — Z283 Underimmunization status: Secondary | ICD-10-CM

## 2018-08-26 DIAGNOSIS — M549 Dorsalgia, unspecified: Secondary | ICD-10-CM | POA: Diagnosis not present

## 2018-08-26 DIAGNOSIS — O9989 Other specified diseases and conditions complicating pregnancy, childbirth and the puerperium: Secondary | ICD-10-CM

## 2018-08-26 DIAGNOSIS — Z3482 Encounter for supervision of other normal pregnancy, second trimester: Secondary | ICD-10-CM

## 2018-08-26 DIAGNOSIS — O09899 Supervision of other high risk pregnancies, unspecified trimester: Secondary | ICD-10-CM

## 2018-08-26 DIAGNOSIS — O99891 Other specified diseases and conditions complicating pregnancy: Secondary | ICD-10-CM

## 2018-08-26 DIAGNOSIS — Z3480 Encounter for supervision of other normal pregnancy, unspecified trimester: Secondary | ICD-10-CM

## 2018-08-26 DIAGNOSIS — M9903 Segmental and somatic dysfunction of lumbar region: Secondary | ICD-10-CM

## 2018-08-26 NOTE — Progress Notes (Signed)
   PRENATAL VISIT NOTE  Subjective:  Dana Lindsey is a 32 y.o. Y8E7207 at 72w5dbeing seen today for ongoing prenatal care.  She is currently monitored for the following issues for this low-risk pregnancy and has Supervision of other normal pregnancy, antepartum; Obesity in pregnancy; and Rubella non-immune status, antepartum on their problem list.  Patient reports backache. Worse on right side. No radiation. Uncomfortable laying down.  Contractions: Not present. Vag. Bleeding: None.  Movement: Present. Denies leaking of fluid.   The following portions of the patient's history were reviewed and updated as appropriate: allergies, current medications, past family history, past medical history, past social history, past surgical history and problem list. Problem list updated.  Objective:   Vitals:   08/26/18 0916  BP: (!) 98/57  Pulse: 87  Weight: 203 lb (92.1 kg)    Fetal Status:   Fundal Height: 24 cm Movement: Present     General:  Alert, oriented and cooperative. Patient is in no acute distress.  Skin: Skin is warm and dry. No rash noted.   Cardiovascular: Normal heart rate noted  Respiratory: Normal respiratory effort, no problems with respiration noted  Abdomen: Soft, gravid, appropriate for gestational age. Pain/Pressure: Absent     Pelvic:  Cervical exam deferred        MSK: Restriction, tenderness, tissue texture changes, and paraspinal spasm in the lumbar spine  Neuro: Moves all four extremities with no focal neurological deficit  Extremities: Normal range of motion.  Edema: None  Mental Status: Normal mood and affect. Normal behavior. Normal judgment and thought content.   OSE: Head   Cervical   Thoracic   Rib   Lumbar L1 ESRL, L5 ESRR  Sacrum L/L  Pelvis Right ant.    Assessment and Plan:  Pregnancy: GK1C2883at 262w5d1. Supervision of other normal pregnancy, antepartum FHT and FH normal - RPR - HIV antibody (with reflex) - Glucose Tolerance, 2 Hours w/1  Hour - CBC  2. Rubella non-immune status, antepartum MMR post delivery  3. Back pain affecting pregnancy in second trimester 4. Somatic dysfunction of spine, lumbar OMT done after patient permission. HVLA technique utilized. 3 areas treated with improvement of tissue texture and joint mobility. Patient tolerated procedure well.    Preterm labor symptoms and general obstetric precautions including but not limited to vaginal bleeding, contractions, leaking of fluid and fetal movement were reviewed in detail with the patient. Please refer to After Visit Summary for other counseling recommendations.  Return in about 4 weeks (around 09/23/2018) for OB f/u.  StTruett MainlandDO

## 2018-08-27 ENCOUNTER — Encounter: Payer: Medicaid Other | Admitting: Obstetrics & Gynecology

## 2018-08-27 LAB — CBC
HEMOGLOBIN: 11.4 g/dL (ref 11.1–15.9)
Hematocrit: 33.6 % — ABNORMAL LOW (ref 34.0–46.6)
MCH: 30.5 pg (ref 26.6–33.0)
MCHC: 33.9 g/dL (ref 31.5–35.7)
MCV: 90 fL (ref 79–97)
PLATELETS: 243 10*3/uL (ref 150–450)
RBC: 3.74 x10E6/uL — ABNORMAL LOW (ref 3.77–5.28)
RDW: 12.7 % (ref 12.3–15.4)
WBC: 8 10*3/uL (ref 3.4–10.8)

## 2018-08-27 LAB — GLUCOSE TOLERANCE, 2 HOURS W/ 1HR
GLUCOSE, 1 HOUR: 77 mg/dL (ref 65–179)
GLUCOSE, 2 HOUR: 76 mg/dL (ref 65–152)
GLUCOSE, FASTING: 77 mg/dL (ref 65–91)

## 2018-08-27 LAB — HIV ANTIBODY (ROUTINE TESTING W REFLEX): HIV SCREEN 4TH GENERATION: NONREACTIVE

## 2018-08-27 LAB — RPR: RPR: NONREACTIVE

## 2018-09-08 NOTE — L&D Delivery Note (Addendum)
OB/GYN Faculty Practice Delivery Note  Dana Lindsey is a 33 y.o. O9B3532 s/p SVD at [redacted]w[redacted]d. She was admitted for elective IOL.   ROM: 6h 10m with clear fluid GBS Status: negative Maximum Maternal Temperature: 98.3  Labor Progress: . Augmentation with AROM and Pitocin   Delivery Date/Time: 1704 12/05/2018 Delivery: Called to room and patient was complete and pushing. Head delivered ROA. No nuchal cord present. Shoulder and body delivered in usual fashion. Infant with spontaneous cry, placed on mother's abdomen, dried and stimulated. Cord clamped x 2 after 1-minute delay, and cut by FOB. Cord blood drawn. Placenta delivered spontaneously with gentle cord traction. Fundus firm with massage and Pitocin. Labia, perineum, vagina, and cervix inspected inspected with small periurethral abrasion, hemostatic, no repair indicated.   Placenta: delivered spontaneously, intact Complications: none Lacerations: small periurethral abrasion, hemostatic EBL: 192cc  Infant: vigorous female  APGARs 9 and 9  weight pending  Orlene Plum, MD Family Medicine Resident   Attestation: I was present for the entire delivery and agree with the resident's documentation.   Lambert Mody. Juleen China, DO OB/GYN Fellow

## 2018-09-24 ENCOUNTER — Ambulatory Visit (INDEPENDENT_AMBULATORY_CARE_PROVIDER_SITE_OTHER): Payer: Medicaid Other | Admitting: Family Medicine

## 2018-09-24 VITALS — BP 108/68 | HR 79 | Wt 204.0 lb

## 2018-09-24 DIAGNOSIS — O99891 Other specified diseases and conditions complicating pregnancy: Secondary | ICD-10-CM

## 2018-09-24 DIAGNOSIS — O99820 Streptococcus B carrier state complicating pregnancy: Secondary | ICD-10-CM

## 2018-09-24 DIAGNOSIS — Z3483 Encounter for supervision of other normal pregnancy, third trimester: Secondary | ICD-10-CM

## 2018-09-24 DIAGNOSIS — Z283 Underimmunization status: Secondary | ICD-10-CM

## 2018-09-24 DIAGNOSIS — M549 Dorsalgia, unspecified: Secondary | ICD-10-CM

## 2018-09-24 DIAGNOSIS — O9989 Other specified diseases and conditions complicating pregnancy, childbirth and the puerperium: Secondary | ICD-10-CM

## 2018-09-24 DIAGNOSIS — M9903 Segmental and somatic dysfunction of lumbar region: Secondary | ICD-10-CM

## 2018-09-24 DIAGNOSIS — O99210 Obesity complicating pregnancy, unspecified trimester: Secondary | ICD-10-CM

## 2018-09-24 DIAGNOSIS — O99213 Obesity complicating pregnancy, third trimester: Secondary | ICD-10-CM

## 2018-09-24 DIAGNOSIS — O09899 Supervision of other high risk pregnancies, unspecified trimester: Secondary | ICD-10-CM

## 2018-09-24 DIAGNOSIS — Z3480 Encounter for supervision of other normal pregnancy, unspecified trimester: Secondary | ICD-10-CM

## 2018-09-24 NOTE — Progress Notes (Signed)
   PRENATAL VISIT NOTE  Subjective:  Dana Lindsey is a 33 y.o. [id number] at 27w6dbeing seen today for ongoing prenatal care.  She is currently monitored for the following issues for this high-risk pregnancy and has Supervision of other normal pregnancy, antepartum; Obesity in pregnancy; and Rubella non-immune status, antepartum on their problem list.  Patient reports backache.  Contractions: Not present. Vag. Bleeding: None.  Movement: Present. Denies leaking of fluid.   The following portions of the patient's history were reviewed and updated as appropriate: allergies, current medications, past family history, past medical history, past social history, past surgical history and problem list. Problem list updated.  Objective:   Vitals:   09/24/18 0928  BP: 108/68  Pulse: 79  Weight: 204 lb (92.5 kg)    Fetal Status: Fetal Heart Rate (bpm): 135 Fundal Height: 30 cm Movement: Present     General:  Alert, oriented and cooperative. Patient is in no acute distress.  Skin: Skin is warm and dry. No rash noted.   Cardiovascular: Normal heart rate noted  Respiratory: Normal respiratory effort, no problems with respiration noted  Abdomen: Soft, gravid, appropriate for gestational age. Pain/Pressure: Absent     Pelvic:  Cervical exam deferred        MSK: Restriction, tenderness, tissue texture changes, and paraspinal spasm in the lumbar spine  Neuro: Moves all four extremities with no focal neurological deficit  Extremities: Normal range of motion.  Edema: None  Mental Status: Normal mood and affect. Normal behavior. Normal judgment and thought content.   OSE: Head   Cervical   Thoracic   Rib   Lumbar L1 ESRL, L5 ESRR  Sacrum L/L  Pelvis Right ant innom    Assessment and Plan:  Pregnancy: [id number] 263w6d1. Supervision of other normal pregnancy, antepartum FHT normal.   2. Rubella non-immune status, antepartum MMR post delivery  3. Obesity in pregnancy  4. Group B  Streptococcus carrier, antepartum Intrapartum prophylaxis  5. Back pain affecting pregnancy in second trimester 6. Somatic dysfunction of spine, lumbar OMT done after patient permission. HVLA technique utilized. 3 areas treated with improvement of tissue texture and joint mobility. Patient tolerated procedure well.    Preterm labor symptoms and general obstetric precautions including but not limited to vaginal bleeding, contractions, leaking of fluid and fetal movement were reviewed in detail with the patient. Please refer to After Visit Summary for other counseling recommendations.  Return in about 2 weeks (around 10/08/2018) for OB f/u.  StTruett MainlandDO

## 2018-10-07 ENCOUNTER — Ambulatory Visit (INDEPENDENT_AMBULATORY_CARE_PROVIDER_SITE_OTHER): Payer: Medicaid Other | Admitting: Family Medicine

## 2018-10-07 VITALS — BP 111/67 | HR 87 | Wt 201.4 lb

## 2018-10-07 DIAGNOSIS — Z283 Underimmunization status: Secondary | ICD-10-CM

## 2018-10-07 DIAGNOSIS — O09899 Supervision of other high risk pregnancies, unspecified trimester: Secondary | ICD-10-CM

## 2018-10-07 DIAGNOSIS — Z3483 Encounter for supervision of other normal pregnancy, third trimester: Secondary | ICD-10-CM

## 2018-10-07 DIAGNOSIS — O9989 Other specified diseases and conditions complicating pregnancy, childbirth and the puerperium: Secondary | ICD-10-CM

## 2018-10-07 DIAGNOSIS — Z3480 Encounter for supervision of other normal pregnancy, unspecified trimester: Secondary | ICD-10-CM

## 2018-10-07 NOTE — Progress Notes (Signed)
   PRENATAL VISIT NOTE  Subjective:  Dana Lindsey is a 33 y.o. [id number] at 41w5dbeing seen today for ongoing prenatal care.  She is currently monitored for the following issues for this low-risk pregnancy and has Supervision of other normal pregnancy, antepartum; Obesity in pregnancy; and Rubella non-immune status, antepartum on their problem list.  Patient reports no complaints.  Contractions: Not present. Vag. Bleeding: None.  Movement: Present. Denies leaking of fluid.   The following portions of the patient's history were reviewed and updated as appropriate: allergies, current medications, past family history, past medical history, past social history, past surgical history and problem list. Problem list updated.  Objective:   Vitals:   10/07/18 1028  BP: 111/67  Pulse: 87  Weight: 201 lb 6.4 oz (91.4 kg)    Fetal Status: Fetal Heart Rate (bpm): 141   Movement: Present     General:  Alert, oriented and cooperative. Patient is in no acute distress.  Skin: Skin is warm and dry. No rash noted.   Cardiovascular: Normal heart rate noted  Respiratory: Normal respiratory effort, no problems with respiration noted  Abdomen: Soft, gravid, appropriate for gestational age.  Pain/Pressure: Present     Pelvic: Cervical exam deferred        Extremities: Normal range of motion.  Edema: Trace  Mental Status: Normal mood and affect. Normal behavior. Normal judgment and thought content.   Assessment and Plan:  Pregnancy: [id number] 341w5d1. Supervision of other normal pregnancy, antepartum FHT normal. FH at upper limits. Will watch closely - if increases, then will get [place] monitor growth  2. Rubella non-immune status, antepartum Postpartum MMR  Preterm labor symptoms and general obstetric precautions including but not limited to vaginal bleeding, contractions, leaking of fluid and fetal movement were reviewed in detail with the patient. Please refer to After Visit Summary for other  counseling recommendations.  No follow-ups on file.  No future appointments.  JaTruett MainlandDO

## 2018-10-22 ENCOUNTER — Ambulatory Visit (INDEPENDENT_AMBULATORY_CARE_PROVIDER_SITE_OTHER): Payer: Medicaid Other | Admitting: Family Medicine

## 2018-10-22 VITALS — BP 112/71 | HR 80 | Wt 205.0 lb

## 2018-10-22 DIAGNOSIS — Z283 Underimmunization status: Secondary | ICD-10-CM

## 2018-10-22 DIAGNOSIS — Z3483 Encounter for supervision of other normal pregnancy, third trimester: Secondary | ICD-10-CM

## 2018-10-22 DIAGNOSIS — Z3A32 32 weeks gestation of pregnancy: Secondary | ICD-10-CM

## 2018-10-22 DIAGNOSIS — O9989 Other specified diseases and conditions complicating pregnancy, childbirth and the puerperium: Secondary | ICD-10-CM

## 2018-10-22 DIAGNOSIS — O99210 Obesity complicating pregnancy, unspecified trimester: Secondary | ICD-10-CM

## 2018-10-22 DIAGNOSIS — O09899 Supervision of other high risk pregnancies, unspecified trimester: Secondary | ICD-10-CM

## 2018-10-22 DIAGNOSIS — O99213 Obesity complicating pregnancy, third trimester: Secondary | ICD-10-CM

## 2018-10-22 DIAGNOSIS — Z3480 Encounter for supervision of other normal pregnancy, unspecified trimester: Secondary | ICD-10-CM

## 2018-10-22 NOTE — Progress Notes (Signed)
   PRENATAL VISIT NOTE  Subjective:  Dana Lindsey is a 33 y.o. [id number] at 61w6dbeing seen today for ongoing prenatal care.  She is currently monitored for the following issues for this low-risk pregnancy and has Supervision of other normal pregnancy, antepartum; Obesity in pregnancy; and Rubella non-immune status, antepartum on their problem list.  Patient reports occasional contractions.  Contractions: Not present. Vag. Bleeding: None.  Movement: Present. Denies leaking of fluid.   The following portions of the patient's history were reviewed and updated as appropriate: allergies, current medications, past family history, past medical history, past social history, past surgical history and problem list. Problem list updated.  Objective:   Vitals:   10/22/18 1057  BP: 112/71  Pulse: 80  Weight: 205 lb (93 kg)    Fetal Status: Fetal Heart Rate (bpm): 130   Movement: Present     General:  Alert, oriented and cooperative. Patient is in no acute distress.  Skin: Skin is warm and dry. No rash noted.   Cardiovascular: Normal heart rate noted  Respiratory: Normal respiratory effort, no problems with respiration noted  Abdomen: Soft, gravid, appropriate for gestational age.  Pain/Pressure: Present     Pelvic: Cervical exam deferred        Extremities: Normal range of motion.  Edema: Trace  Mental Status: Normal mood and affect. Normal behavior. Normal judgment and thought content.   Assessment and Plan:  Pregnancy: [id number] 383w6d1. Supervision of other normal pregnancy, antepartum FHT and FH normal  2. Rubella non-immune status, antepartum MMR post delivery  3. Obesity in pregnancy Appropriate weight gain  Preterm labor symptoms and general obstetric precautions including but not limited to vaginal bleeding, contractions, leaking of fluid and fetal movement were reviewed in detail with the patient. Please refer to After Visit Summary for other counseling recommendations.    Return in about 2 weeks (around 11/05/2018) for OB f/u.  Future Appointments  Date Time Provider DeJolly2/28/2020  9:30 AM StTruett MainlandDO [facility] None    JaTruett MainlandDO

## 2018-11-05 ENCOUNTER — Ambulatory Visit (INDEPENDENT_AMBULATORY_CARE_PROVIDER_SITE_OTHER): Payer: Medicaid Other | Admitting: Family Medicine

## 2018-11-05 VITALS — BP 112/65 | HR 103 | Wt 206.0 lb

## 2018-11-05 DIAGNOSIS — O99210 Obesity complicating pregnancy, unspecified trimester: Secondary | ICD-10-CM

## 2018-11-05 DIAGNOSIS — O99213 Obesity complicating pregnancy, third trimester: Secondary | ICD-10-CM

## 2018-11-05 DIAGNOSIS — Z2839 Other underimmunization status: Secondary | ICD-10-CM

## 2018-11-05 DIAGNOSIS — Z283 Underimmunization status: Secondary | ICD-10-CM

## 2018-11-05 DIAGNOSIS — Z3480 Encounter for supervision of other normal pregnancy, unspecified trimester: Secondary | ICD-10-CM

## 2018-11-05 DIAGNOSIS — Z3A34 34 weeks gestation of pregnancy: Secondary | ICD-10-CM

## 2018-11-05 DIAGNOSIS — O9989 Other specified diseases and conditions complicating pregnancy, childbirth and the puerperium: Secondary | ICD-10-CM

## 2018-11-05 DIAGNOSIS — R82998 Other abnormal findings in urine: Secondary | ICD-10-CM

## 2018-11-05 DIAGNOSIS — Z3483 Encounter for supervision of other normal pregnancy, third trimester: Secondary | ICD-10-CM

## 2018-11-05 LAB — POCT URINALYSIS DIPSTICK OB
Glucose, UA: NEGATIVE
Ketones, UA: NEGATIVE
Leukocytes, UA: NEGATIVE
Nitrite, UA: NEGATIVE
POC,PROTEIN,UA: NEGATIVE
Spec Grav, UA: 1.015 (ref 1.010–1.025)
pH, UA: 6.5 (ref 5.0–8.0)

## 2018-11-05 NOTE — Progress Notes (Signed)
    PRENATAL VISIT NOTE  Subjective:  Dana Lindsey is a 33 y.o. [id number] at 16w6dbeing seen today for ongoing prenatal care.  She is currently monitored for the following issues for this low-risk pregnancy and has Supervision of other normal pregnancy, antepartum; Obesity in pregnancy; and Rubella non-immune status, antepartum on their problem list.  Patient reports cough, congestion. Has orange urine.  Contractions: Not present. Vag. Bleeding: None.  Movement: Present. Denies leaking of fluid.   The following portions of the patient's history were reviewed and updated as appropriate: allergies, current medications, past family history, past medical history, past social history, past surgical history and problem list. Problem list updated.  Objective:   Vitals:   11/05/18 0940  BP: 112/65  Pulse: (!) 103  Weight: 206 lb 0.6 oz (93.5 kg)    Fetal Status: Fetal Heart Rate (bpm): 144 Fundal Height: 36 cm Movement: Present  Presentation: Vertex  General:  Alert, oriented and cooperative. Patient is in no acute distress.  Skin: Skin is warm and dry. No rash noted.   Cardiovascular: Normal heart rate noted  Respiratory: Normal respiratory effort, no problems with respiration noted  Abdomen: Soft, gravid, appropriate for gestational age.  Pain/Pressure: Present     Pelvic: Cervical exam deferred        Extremities: Normal range of motion.  Edema: Trace  Mental Status: Normal mood and affect. Normal behavior. Normal judgment and thought content.   Assessment and Plan:  Pregnancy: [id number] 348w6d1. Supervision of other normal pregnancy, antepartum FHT and FH normal. OTC for cough and congestion.  2. Dark urine UCx - POC Urinalysis Dipstick OB - Culture, OB Urine  3. Rubella non-immune status, antepartum MMR post delivery  4. Obesity in pregnancy weight gain normal  Preterm labor symptoms and general obstetric precautions including but not limited to vaginal bleeding,  contractions, leaking of fluid and fetal movement were reviewed in detail with the patient. Please refer to After Visit Summary for other counseling recommendations.  Return in about 2 weeks (around 11/19/2018).  No future appointments.  JaTruett MainlandDO

## 2018-11-07 LAB — URINE CULTURE, OB REFLEX: Organism ID, Bacteria: NO GROWTH

## 2018-11-07 LAB — CULTURE, OB URINE

## 2018-11-18 ENCOUNTER — Ambulatory Visit (INDEPENDENT_AMBULATORY_CARE_PROVIDER_SITE_OTHER): Payer: Medicaid Other | Admitting: Family Medicine

## 2018-11-18 ENCOUNTER — Other Ambulatory Visit: Payer: Self-pay

## 2018-11-18 ENCOUNTER — Other Ambulatory Visit (HOSPITAL_COMMUNITY)
Admission: RE | Admit: 2018-11-18 | Discharge: 2018-11-18 | Disposition: A | Discharge status: Home / Self Care | Payer: Medicaid Other | Source: Ambulatory Visit | Attending: Family Medicine | Admitting: Family Medicine

## 2018-11-18 VITALS — BP 100/69 | HR 79 | Wt 205.1 lb

## 2018-11-18 DIAGNOSIS — Z3480 Encounter for supervision of other normal pregnancy, unspecified trimester: Secondary | ICD-10-CM | POA: Insufficient documentation

## 2018-11-18 DIAGNOSIS — Z3A36 36 weeks gestation of pregnancy: Secondary | ICD-10-CM

## 2018-11-18 DIAGNOSIS — Z3483 Encounter for supervision of other normal pregnancy, third trimester: Secondary | ICD-10-CM

## 2018-11-18 NOTE — Progress Notes (Signed)
   PRENATAL VISIT NOTE  Subjective:  Dana Lindsey is a 33 y.o. [id number] at [redacted]w[redacted]d being seen today for ongoing prenatal care.  She is currently monitored for the following issues for this low-risk pregnancy and has Supervision of other normal pregnancy, antepartum; Obesity in pregnancy; and Rubella non-immune status, antepartum on their problem list.  Patient reports no complaints.  Contractions: Not present. Vag. Bleeding: None.  Movement: Present. Denies leaking of fluid.   The following portions of the patient's history were reviewed and updated as appropriate: allergies, current medications, past family history, past medical history, past social history, past surgical history and problem list.   Objective:   Vitals:   11/18/18 1011  BP: 100/69  Pulse: 79  Weight: 205 lb 1.9 oz (93 kg)    Fetal Status: Fetal Heart Rate (bpm): 139   Movement: Present     General:  Alert, oriented and cooperative. Patient is in no acute distress.  Skin: Skin is warm and dry. No rash noted.   Cardiovascular: Normal heart rate noted  Respiratory: Normal respiratory effort, no problems with respiration noted  Abdomen: Soft, gravid, appropriate for gestational age.  Pain/Pressure: Present     Pelvic: Cervical exam deferred        Extremities: Normal range of motion.  Edema: Trace  Mental Status: Normal mood and affect. Normal behavior. Normal judgment and thought content.   Assessment and Plan:  Pregnancy: [id number] at [redacted]w[redacted]d 1. Supervision of other normal pregnancy, antepartum FHT and FH normal - GC/Chlamydia probe amp ([place])not at [facility] - Culture, beta strep (group b only)  Preterm labor symptoms and general obstetric precautions including but not limited to vaginal bleeding, contractions, leaking of fluid and fetal movement were reviewed in detail with the patient. Please refer to After Visit Summary for other counseling recommendations.   Return in about 1 week (around 11/25/2018) for OB  f/u.  No future appointments.  Truett Mainland, DO

## 2018-11-19 LAB — GC/CHLAMYDIA PROBE AMP (~~LOC~~) NOT AT ARMC
Chlamydia: NEGATIVE
Neisseria Gonorrhea: NEGATIVE

## 2018-11-22 ENCOUNTER — Telehealth: Payer: Self-pay

## 2018-11-22 LAB — OB RESULTS CONSOLE GBS: STREP GROUP B AG: NEGATIVE

## 2018-11-22 LAB — CULTURE, BETA STREP (GROUP B ONLY): Strep Gp B Culture: NEGATIVE

## 2018-11-22 NOTE — Telephone Encounter (Signed)
Patient called stating that she has a stomach virus. Patient is feeling baby move well. Patient instructed to stay hydrated with water and gatorade/pedilyte. If patient is unable to keep fluids down instructed to go to [facility] hospital for evaluation. Patient states understanding. Kathrene Alu RN

## 2018-11-25 ENCOUNTER — Ambulatory Visit (INDEPENDENT_AMBULATORY_CARE_PROVIDER_SITE_OTHER): Payer: Medicaid Other | Admitting: Family Medicine

## 2018-11-25 ENCOUNTER — Other Ambulatory Visit: Payer: Self-pay

## 2018-11-25 VITALS — BP 116/80 | HR 80 | Wt 205.0 lb

## 2018-11-25 DIAGNOSIS — O9989 Other specified diseases and conditions complicating pregnancy, childbirth and the puerperium: Secondary | ICD-10-CM

## 2018-11-25 DIAGNOSIS — Z283 Underimmunization status: Secondary | ICD-10-CM

## 2018-11-25 DIAGNOSIS — Z3A37 37 weeks gestation of pregnancy: Secondary | ICD-10-CM

## 2018-11-25 DIAGNOSIS — Z3480 Encounter for supervision of other normal pregnancy, unspecified trimester: Secondary | ICD-10-CM

## 2018-11-25 DIAGNOSIS — Z2839 Other underimmunization status: Secondary | ICD-10-CM

## 2018-11-25 NOTE — Progress Notes (Signed)
   PRENATAL VISIT NOTE  Subjective:  Dana Lindsey is a 33 y.o. C3U1314 at [redacted]w[redacted]d being seen today for ongoing prenatal care.  She is currently monitored for the following issues for this low-risk pregnancy and has Supervision of other normal pregnancy, antepartum; Obesity in pregnancy; and Rubella non-immune status, antepartum on their problem list.  Patient reports no complaints. Had viral illness a couple days ago - nausea, vomiting. No respiratory symptoms.  Contractions: Not present. Vag. Bleeding: None.  Movement: Present. Denies leaking of fluid.   The following portions of the patient's history were reviewed and updated as appropriate: allergies, current medications, past family history, past medical history, past social history, past surgical history and problem list.   Objective:   Vitals:   11/25/18 1018  BP: 116/80  Pulse: 80  Weight: 205 lb (93 kg)    Fetal Status: Fetal Heart Rate (bpm): 135   Movement: Present     General:  Alert, oriented and cooperative. Patient is in no acute distress.  Skin: Skin is warm and dry. No rash noted.   Cardiovascular: Normal heart rate noted  Respiratory: Normal respiratory effort, no problems with respiration noted  Abdomen: Soft, gravid, appropriate for gestational age.  Pain/Pressure: Present     Pelvic: Cervical exam deferred        Extremities: Normal range of motion.  Edema: Trace  Mental Status: Normal mood and affect. Normal behavior. Normal judgment and thought content.   Assessment and Plan:  Pregnancy: H8O8757 at [redacted]w[redacted]d 1. Supervision of other normal pregnancy, antepartum FHT and FH normal  2. Rubella non-immune status, antepartum   Preterm labor symptoms and general obstetric precautions including but not limited to vaginal bleeding, contractions, leaking of fluid and fetal movement were reviewed in detail with the patient. Please refer to After Visit Summary for other counseling recommendations.   Return in about 1  week (around 12/02/2018) for OB f/u.  No future appointments.  Truett Mainland, DO

## 2018-12-02 ENCOUNTER — Encounter (HOSPITAL_COMMUNITY): Payer: Self-pay | Admitting: *Deleted

## 2018-12-02 ENCOUNTER — Other Ambulatory Visit: Payer: Self-pay

## 2018-12-02 ENCOUNTER — Ambulatory Visit (INDEPENDENT_AMBULATORY_CARE_PROVIDER_SITE_OTHER): Payer: Medicaid Other | Admitting: Family Medicine

## 2018-12-02 ENCOUNTER — Telehealth (HOSPITAL_COMMUNITY): Payer: Self-pay | Admitting: *Deleted

## 2018-12-02 VITALS — BP 103/59 | HR 87 | Temp 98.4°F | Wt 206.1 lb

## 2018-12-02 DIAGNOSIS — Z283 Underimmunization status: Secondary | ICD-10-CM

## 2018-12-02 DIAGNOSIS — O9989 Other specified diseases and conditions complicating pregnancy, childbirth and the puerperium: Secondary | ICD-10-CM

## 2018-12-02 DIAGNOSIS — Z3480 Encounter for supervision of other normal pregnancy, unspecified trimester: Secondary | ICD-10-CM

## 2018-12-02 DIAGNOSIS — Z2839 Other underimmunization status: Secondary | ICD-10-CM

## 2018-12-02 DIAGNOSIS — Z3A38 38 weeks gestation of pregnancy: Secondary | ICD-10-CM

## 2018-12-02 NOTE — Telephone Encounter (Signed)
Preadmission screen  

## 2018-12-02 NOTE — Progress Notes (Addendum)
   PRENATAL VISIT NOTE  Subjective:  Dana Lindsey is a 33 y.o. [id number] at 37w5dbeing seen today for ongoing prenatal care.  She is currently monitored for the following issues for this low-risk pregnancy and has Supervision of other normal pregnancy, antepartum; Obesity in pregnancy; and Rubella non-immune status, antepartum on their problem list.  Patient reports no complaints.  Contractions: Irregular. Vag. Bleeding: None.  Movement: Present. Denies leaking of fluid.   The following portions of the patient's history were reviewed and updated as appropriate: allergies, current medications, past family history, past medical history, past social history, past surgical history and problem list.   Objective:   Vitals:   12/02/18 1016  BP: (!) 103/59  Pulse: 87  Temp: 98.4 F (36.9 C)  Weight: 206 lb 1.3 oz (93.5 kg)    Fetal Status: Fetal Heart Rate (bpm): 134 Fundal Height: 40 cm Movement: Present  Presentation: Vertex  General:  Alert, oriented and cooperative. Patient is in no acute distress.  Skin: Skin is warm and dry. No rash noted.   Cardiovascular: Normal heart rate noted  Respiratory: Normal respiratory effort, no problems with respiration noted  Abdomen: Soft, gravid, appropriate for gestational age.  Pain/Pressure: Present     Pelvic: Cervical exam performed Dilation: 4 Effacement (%): 70 Station: -2  Extremities: Normal range of motion.  Edema: Trace  Mental Status: Normal mood and affect. Normal behavior. Normal judgment and thought content.   Assessment and Plan:  Pregnancy: [id number] 336w5d1. Supervision of other normal pregnancy, antepartum FHT and FH normal. Elective induction at 39 weeks desired. Favorable cervix for pitocin and AROM. Will schedule.  2. Rubella non-immune status, antepartum MMR following delivery   Term labor symptoms and general obstetric precautions including but not limited to vaginal bleeding, contractions, leaking of fluid and  fetal movement were reviewed in detail with the patient. Please refer to After Visit Summary for other counseling recommendations.   No follow-ups on file.  Future Appointments  Date Time Provider [place]4/11/2018 10:15 AM StTruett MainlandDO [facility] None    JaTruett MainlandDO

## 2018-12-02 NOTE — Addendum Note (Signed)
Addended by: Truett Mainland on: 12/02/2018 02:58 PM   Modules accepted: Orders, SmartSet

## 2018-12-03 ENCOUNTER — Other Ambulatory Visit: Payer: Self-pay | Admitting: Advanced Practice Midwife

## 2018-12-05 ENCOUNTER — Inpatient Hospital Stay (HOSPITAL_COMMUNITY)
Admission: AD | Admit: 2018-12-05 | Discharge: 2018-12-06 | DRG: 807 | Disposition: A | Discharge status: Home / Self Care | Payer: Medicaid Other | Attending: Obstetrics and Gynecology | Admitting: Obstetrics and Gynecology

## 2018-12-05 ENCOUNTER — Encounter (HOSPITAL_COMMUNITY): Payer: Self-pay | Admitting: *Deleted

## 2018-12-05 ENCOUNTER — Inpatient Hospital Stay (HOSPITAL_COMMUNITY): Payer: Medicaid Other

## 2018-12-05 ENCOUNTER — Other Ambulatory Visit: Payer: Self-pay

## 2018-12-05 DIAGNOSIS — E669 Obesity, unspecified: Secondary | ICD-10-CM | POA: Diagnosis present

## 2018-12-05 DIAGNOSIS — O26893 Other specified pregnancy related conditions, third trimester: Secondary | ICD-10-CM | POA: Diagnosis present

## 2018-12-05 DIAGNOSIS — Z3A39 39 weeks gestation of pregnancy: Secondary | ICD-10-CM

## 2018-12-05 DIAGNOSIS — Z283 Underimmunization status: Secondary | ICD-10-CM

## 2018-12-05 DIAGNOSIS — O9989 Other specified diseases and conditions complicating pregnancy, childbirth and the puerperium: Secondary | ICD-10-CM

## 2018-12-05 DIAGNOSIS — Z3490 Encounter for supervision of normal pregnancy, unspecified, unspecified trimester: Secondary | ICD-10-CM | POA: Diagnosis present

## 2018-12-05 DIAGNOSIS — O99214 Obesity complicating childbirth: Principal | ICD-10-CM | POA: Diagnosis present

## 2018-12-05 DIAGNOSIS — O09899 Supervision of other high risk pregnancies, unspecified trimester: Secondary | ICD-10-CM

## 2018-12-05 DIAGNOSIS — O99210 Obesity complicating pregnancy, unspecified trimester: Secondary | ICD-10-CM | POA: Diagnosis present

## 2018-12-05 LAB — CBC
HCT: 35.1 % — ABNORMAL LOW (ref 36.0–46.0)
Hemoglobin: 11.9 g/dL — ABNORMAL LOW (ref 12.0–15.0)
MCH: 29.7 pg (ref 26.0–34.0)
MCHC: 33.9 g/dL (ref 30.0–36.0)
MCV: 87.5 fL (ref 80.0–100.0)
Platelets: 191 10*3/uL (ref 150–400)
RBC: 4.01 MIL/uL (ref 3.87–5.11)
RDW: 12.4 % (ref 11.5–15.5)
WBC: 8.8 10*3/uL (ref 4.0–10.5)
nRBC: 0 % (ref 0.0–0.2)

## 2018-12-05 LAB — ABO/RH: ABO/RH(D): A POS

## 2018-12-05 LAB — TYPE AND SCREEN
ABO/RH(D): A POS
Antibody Screen: NEGATIVE

## 2018-12-05 LAB — RPR: RPR Ser Ql: NONREACTIVE

## 2018-12-05 MED ORDER — TERBUTALINE SULFATE 1 MG/ML IJ SOLN: 0.2500 mg | Freq: Once | INTRAMUSCULAR | Status: DC | PRN

## 2018-12-05 MED ORDER — WITCH HAZEL-GLYCERIN EX PADS: 1.0000 "application " | MEDICATED_PAD | CUTANEOUS | Status: DC | PRN

## 2018-12-05 MED ORDER — OXYTOCIN 40 UNITS IN NORMAL SALINE INFUSION - SIMPLE MED
1.0000 m[IU]/min | INTRAVENOUS | Status: DC
  Administered 2018-12-05: 2 m[IU]/min via INTRAVENOUS
  Filled 2018-12-05: qty 1000

## 2018-12-05 MED ORDER — SENNOSIDES-DOCUSATE SODIUM 8.6-50 MG PO TABS
2.0000 | ORAL_TABLET | ORAL | Status: DC
  Administered 2018-12-05: 2 via ORAL
  Filled 2018-12-05: qty 2

## 2018-12-05 MED ORDER — LACTATED RINGERS IV SOLN
INTRAVENOUS | Status: DC
  Administered 2018-12-05 (×2): via INTRAVENOUS

## 2018-12-05 MED ORDER — BENZOCAINE-MENTHOL 20-0.5 % EX AERO: 1.0000 "application " | INHALATION_SPRAY | CUTANEOUS | Status: DC | PRN

## 2018-12-05 MED ORDER — OXYTOCIN 40 UNITS IN NORMAL SALINE INFUSION - SIMPLE MED
2.5000 [IU]/h | INTRAVENOUS | Status: DC
  Administered 2018-12-05: 2.5 [IU]/h via INTRAVENOUS

## 2018-12-05 MED ORDER — LIDOCAINE HCL (PF) 1 % IJ SOLN: 30.0000 mL | INTRAMUSCULAR | Status: DC | PRN

## 2018-12-05 MED ORDER — EPHEDRINE 5 MG/ML INJ: 10.0000 mg | INTRAVENOUS | Status: DC | PRN

## 2018-12-05 MED ORDER — DIBUCAINE 1 % RE OINT: 1.0000 "application " | TOPICAL_OINTMENT | RECTAL | Status: DC | PRN

## 2018-12-05 MED ORDER — DOCUSATE SODIUM 100 MG PO CAPS
100.0000 mg | ORAL_CAPSULE | Freq: Two times a day (BID) | ORAL | Status: DC
  Administered 2018-12-05 – 2018-12-06 (×2): 100 mg via ORAL
  Filled 2018-12-05 (×2): qty 1

## 2018-12-05 MED ORDER — ONDANSETRON HCL 4 MG/2ML IJ SOLN: 4.0000 mg | Freq: Four times a day (QID) | INTRAMUSCULAR | Status: DC | PRN

## 2018-12-05 MED ORDER — OXYCODONE-ACETAMINOPHEN 5-325 MG PO TABS: 2.0000 | ORAL_TABLET | ORAL | Status: DC | PRN

## 2018-12-05 MED ORDER — PHENYLEPHRINE 40 MCG/ML (10ML) SYRINGE FOR IV PUSH (FOR BLOOD PRESSURE SUPPORT): 80.0000 ug | PREFILLED_SYRINGE | INTRAVENOUS | Status: DC | PRN

## 2018-12-05 MED ORDER — OXYTOCIN BOLUS FROM INFUSION
500.0000 mL | Freq: Once | INTRAVENOUS | Status: AC
  Administered 2018-12-05: 500 mL via INTRAVENOUS

## 2018-12-05 MED ORDER — SOD CITRATE-CITRIC ACID 500-334 MG/5ML PO SOLN: 30.0000 mL | ORAL | Status: DC | PRN

## 2018-12-05 MED ORDER — LACTATED RINGERS IV SOLN: 500.0000 mL | Freq: Once | INTRAVENOUS | Status: DC

## 2018-12-05 MED ORDER — LACTATED RINGERS IV SOLN: 500.0000 mL | INTRAVENOUS | Status: DC | PRN

## 2018-12-05 MED ORDER — DIPHENHYDRAMINE HCL 25 MG PO CAPS: 25.0000 mg | ORAL_CAPSULE | Freq: Four times a day (QID) | ORAL | Status: DC | PRN

## 2018-12-05 MED ORDER — SIMETHICONE 80 MG PO CHEW: 80.0000 mg | CHEWABLE_TABLET | ORAL | Status: DC | PRN

## 2018-12-05 MED ORDER — IBUPROFEN 600 MG PO TABS
600.0000 mg | ORAL_TABLET | Freq: Four times a day (QID) | ORAL | Status: DC
  Administered 2018-12-05 – 2018-12-06 (×4): 600 mg via ORAL
  Filled 2018-12-05 (×4): qty 1

## 2018-12-05 MED ORDER — ONDANSETRON HCL 4 MG/2ML IJ SOLN: 4.0000 mg | INTRAMUSCULAR | Status: DC | PRN

## 2018-12-05 MED ORDER — PRENATAL MULTIVITAMIN CH
1.0000 | ORAL_TABLET | Freq: Every day | ORAL | Status: DC
  Administered 2018-12-06: 1 via ORAL
  Filled 2018-12-05: qty 1

## 2018-12-05 MED ORDER — FENTANYL-BUPIVACAINE-NACL 0.5-0.125-0.9 MG/250ML-% EP SOLN: 12.0000 mL/h | EPIDURAL | Status: DC | PRN

## 2018-12-05 MED ORDER — OXYCODONE-ACETAMINOPHEN 5-325 MG PO TABS: 1.0000 | ORAL_TABLET | ORAL | Status: DC | PRN

## 2018-12-05 MED ORDER — COCONUT OIL OIL: 1.0000 "application " | TOPICAL_OIL | Status: DC | PRN

## 2018-12-05 MED ORDER — TETANUS-DIPHTH-ACELL PERTUSSIS 5-2.5-18.5 LF-MCG/0.5 IM SUSP: 0.5000 mL | Freq: Once | INTRAMUSCULAR | Status: AC

## 2018-12-05 MED ORDER — ACETAMINOPHEN 325 MG PO TABS: 650.0000 mg | ORAL_TABLET | ORAL | Status: DC | PRN

## 2018-12-05 MED ORDER — ONDANSETRON HCL 4 MG PO TABS: 4.0000 mg | ORAL_TABLET | ORAL | Status: DC | PRN

## 2018-12-05 MED ORDER — ACETAMINOPHEN 325 MG PO TABS
650.0000 mg | ORAL_TABLET | ORAL | Status: DC | PRN
  Administered 2018-12-05: 650 mg via ORAL
  Filled 2018-12-05: qty 2

## 2018-12-05 NOTE — Discharge Summary (Addendum)
Obstetrics Discharge Summary OB/[facility]N Faculty [facility]   Patient Name: Dana Lindsey DOB: 09-30-1985 MRN: [id number]  Date of admission: 12/05/2018 Delivering MD: Orlene Plum A   Date of discharge: 12/06/2018 Admitting diagnosis: pregnancy Intrauterine pregnancy: [redacted]w[redacted]d     Secondary diagnosis:   Principal Problem:   Encounter for elective induction of labor Active Problems:   Obesity in pregnancy   Rubella non-immune status, antepartum    Discharge diagnosis: Term Pregnancy Delivered                                            Postpartum procedures: None  Complications: None  Hospital course: Dana Lindsey is a 33 y.o. [redacted]w[redacted]d who was admitted for elective induction of labor. Her pregnancy was complicated by obesity, rubella non-immune. Her labor course was notable for induction with AROM and pitocin. Delivery was uncomplicated. Please see delivery/op note for additional details. Her postpartum course was uncomplicated. She was breastfeeding without difficulty. By day of discharge, she was passing flatus, urinating, eating and drinking without difficulty. Her pain was well-controlled, and she was discharged home with tylenol and ibuprofen. She will follow-up in clinic in 4 weeks.   Physical exam  Vitals:   12/05/18 2055 12/06/18 0022 12/06/18 0505 12/06/18 0802  BP: 107/71 124/82 93/84 118/80  Pulse: 95 86 73 67  Resp: 18 16 16 18   Temp: 98.1 F (36.7 C) 98.1 F (36.7 C) 97.8 F (36.6 C) 98 F (36.7 C)  TempSrc: Oral Oral Oral Oral  SpO2: 97% 100% 100% 100%  Weight:      Height:       General: well appearing, no distress Lochia: appropriate Uterine Fundus: firm Incision: N/A DVT Evaluation: No cords or calf tenderness. No significant calf/ankle edema.  Labs: Lab Results  Component Value Date   WBC 8.8 12/05/2018   HGB 11.9 (L) 12/05/2018   HCT 35.1 (L) 12/05/2018   MCV 87.5 12/05/2018   PLT 191 12/05/2018   CMP Latest Ref Rng & Units 08/22/2012  Glucose 70 -  99 mg/dL 100(H)  BUN 6 - 23 mg/dL 10  Creatinine 0.50 - 1.10 mg/dL 0.55  Sodium 135 - 145 mEq/L 135  Potassium 3.5 - 5.1 mEq/L 3.7  Chloride 96 - 112 mEq/L 99  CO2 19 - 32 mEq/L 23  Calcium 8.4 - 10.5 mg/dL 9.7  Total Protein 6.0 - 8.3 g/dL 8.0  Total Bilirubin 0.3 - 1.2 mg/dL 0.4  Alkaline Phos 39 - 117 U/L 67  AST 0 - 37 U/L 15  ALT 0 - 35 U/L 17   Discharge instructions: Per After Visit Summary and "Baby and Me Booklet"  After visit meds:  Allergies as of 12/06/2018   No Known Allergies     Medication List    TAKE these medications   docusate sodium 100 MG capsule Commonly known as:  COLACE Take 1 capsule (100 mg total) by mouth 2 (two) times daily.   ibuprofen 600 MG tablet Commonly known as:  ADVIL,MOTRIN Take 1 tablet (600 mg total) by mouth every 6 (six) hours.   PRENATAL VITAMIN PO Take 1 tablet by mouth daily.      Postpartum contraception: Unsure, doesn't want hormonal birth control. Discussed copper IUD, interval BTL and vasectomy. Diet: Routine Diet Activity: Advance as tolerated. Pelvic rest for 6 weeks.   Follow-up Appt:No future appointments. Follow-up Visit:No follow-ups on file.  Please schedule this patient for Postpartum visit in: 4 weeks with the following provider: Any provider Low risk pregnancy complicated by: none Delivery mode:  SVD Anticipated Birth Control:  other/unsure PP Procedures needed: none  Schedule Integrated BH visit: no  Newborn Data: Live born female  Birth Weight:   APGAR: 58, 9  Newborn Delivery   Birth date/time:  12/05/2018 17:04:00 Delivery type:  Vaginal, Spontaneous    Baby Feeding: Breast Disposition:home with mother  Orlene Plum, MD Faculty Service, Resident     OB FELLOW DISCHARGE ATTESTATION  I have seen and examined this patient and agree with above documentation in the resident's note.   Muneer Leider,MD OB Fellow  12/06/2018, 11:26 AM

## 2018-12-05 NOTE — H&P (Addendum)
OBSTETRIC ADMISSION HISTORY AND PHYSICAL  Dana Lindsey is a 33 y.o. female [phone] with IUP at [redacted]w[redacted]d by LMP, 1st trimester [place] presenting for term elective IOL. Reports fetal movement. Denies vaginal bleeding, LOF and contractions. Denies any medical problems this pregnancy or any medication other than a PNV.   She received her prenatal care at [facility]  Support person in labor: FOB  Ultrasounds . Anatomy U/S: normal  Prenatal History/Complications: . Obesity . Rubella non-immune  Past Medical History: Past Medical History:  Diagnosis Date  . Cholecystolithiasis   . Ear infection   . Gastritis   . Shortness of breath    with exertion    Past Surgical History: Past Surgical History:  Procedure Laterality Date  . CHOLECYSTECTOMY  08/23/2012   Procedure: LAPAROSCOPIC CHOLECYSTECTOMY WITH INTRAOPERATIVE CHOLANGIOGRAM;  Surgeon: Edward Jolly, MD;  Location: [facility] OR;  Service: General;  Laterality: N/A;    Obstetrical History: OB History    Gravida  5   Para  3   Term  3   Preterm      AB  1   Living  3     SAB  1   TAB      Ectopic      Multiple  0   Live Births  3           Social History: Social History   Socioeconomic History  . Marital status: Married    Spouse name: Not on file  . Number of children: Not on file  . Years of education: Not on file  . Highest education level: Not on file  Occupational History  . Not on file  Social Needs  . Financial resource strain: Not hard at all  . Food insecurity:    Worry: Never true    Inability: Never true  . Transportation needs:    Medical: No    Non-medical: Not on file  Tobacco Use  . Smoking status: Never Smoker  . Smokeless tobacco: Never Used  Substance and Sexual Activity  . Alcohol use: No  . Drug use: No  . Sexual activity: Yes  Lifestyle  . Physical activity:    Days per week: Not on file    Minutes per session: Not on file  . Stress: To some extent  Relationships  .  Social connections:    Talks on phone: Not on file    Gets together: Not on file    Attends religious service: Not on file    Active member of club or organization: Not on file    Attends meetings of clubs or organizations: Not on file    Relationship status: Not on file  Other Topics Concern  . Not on file  Social History Narrative  . Not on file    Family History: Family History  Problem Relation Age of Onset  . Alcoholism Father   . Asthma Daughter   . Asthma Son     Allergies: No Known Allergies  Medications Prior to Admission  Medication Sig Dispense Refill Last Dose  . Prenatal Vit-Fe Fumarate-FA (PRENATAL VITAMIN PO) Take 1 tablet by mouth daily.    12/05/2018 at Unknown time     Review of Systems  All systems reviewed and negative except as stated in HPI  Blood pressure 112/76, pulse 75, temperature 98.3 F (36.8 C), temperature source Oral, resp. rate 20, height 5\' 3"  (1.6 m), weight 93.5 kg, last menstrual period 03/06/2018, unknown if currently breastfeeding. General appearance:  alert, cooperative and no distress Lungs: no respiratory distress Heart: regular rate  Abdomen: soft, non-tender; gravid abdomen Extremities: no LE edema Presentation: cephalic Fetal monitoring: baseline 135bpm,+accels, good variability, one variable  Uterine activity: quiet Dilation: 4.5 Effacement (%): 50 Station: -3 Exam by:: Dr Tammi Klippel Prenatal labs: ABO, Rh: --/--/A POS, A POS Performed at [facility], [place] [address] [place], [place] [address]  [phone]/29 0745) Antibody: NEG (03/29 0745) Rubella: <0.90 (09/12 1129) RPR: Non Reactive (12/19 1004)  HBsAg: Negative (09/12 1129)  HIV: Non Reactive (12/19 1004)  GBS: Negative (03/16 0000)negative  Glucola: normal Genetic screening:  NIPS low risk  Prenatal Transfer Tool  Maternal Diabetes: No Genetic Screening: Normal Maternal Ultrasounds/Referrals: Normal Fetal Ultrasounds or other Referrals:  None Maternal  Substance Abuse:  No Significant Maternal Medications:  None Significant Maternal Lab Results: Lab values include: Group B Strep negative  Results for orders placed or performed during the hospital encounter of 12/05/18 (from the past 24 hour(s))  Type and screen   Collection Time: 12/05/18  7:45 AM  Result Value Ref Range   ABO/RH(D) A POS    Antibody Screen NEG    Sample Expiration      12/08/2018 Performed at [facility], [place] [address], [place], [place] [address]   ABO/Rh   Collection Time: 12/05/18  7:45 AM  Result Value Ref Range   ABO/RH(D)      A POS Performed at [facility] [address], [place], [place] [address]   CBC   Collection Time: 12/05/18  7:55 AM  Result Value Ref Range   WBC 8.8 4.0 - 10.5 K/uL   RBC 4.01 3.87 - 5.11 MIL/uL   Hemoglobin 11.9 (L) 12.0 - 15.0 g/dL   HCT 35.1 (L) 36.0 - 46.0 %   MCV 87.5 80.0 - 100.0 fL   MCH 29.7 26.0 - 34.0 pg   MCHC 33.9 30.0 - 36.0 g/dL   RDW 12.4 11.5 - 15.5 %   Platelets 191 150 - 400 K/uL   nRBC 0.0 0.0 - 0.2 %    Patient Active Problem List   Diagnosis Date Noted  . Encounter for elective induction of labor 12/05/2018  . Rubella non-immune status, antepartum 05/22/2018  . Supervision of other normal pregnancy, antepartum 12/17/2016  . Obesity in pregnancy 12/17/2016    Assessment/Plan:  Dana Lindsey is a 33 y.o. M5H8469 at [redacted]w[redacted]d here for elective induction.   Labor: plan for AROM then expectant management, pitocin if needed -- pain control: desires natural delivery  Fetal Wellbeing: EFW 7-8lbs by Leopold's. Cephalic by exam.  -- GBS negative -- continuous fetal monitoring - Cat 2   Postpartum Planning -- both/unsure  -- Rh+ / does not appear that patient was given Tdap on chart review   Orlene Plum, MD Family Medicine Resident   Attestation: I have seen this patient and agree with the resident's documentation. I have examined them separately, and we have discussed the plan of  care. I performed amniotomy. Patient planning to have a light breakfast and then will start pitocin.   Lambert Mody. Juleen China, DO OB/GYN Fellow

## 2018-12-05 NOTE — Progress Notes (Signed)
OB/GYN Faculty Practice: Labor Progress Note  Subjective: Contractions more painful. Feeling intermittent pressure. FOB at bedside for support.   Objective: BP 129/81   Pulse 73   Temp 97.8 F (36.6 C) (Oral)   Resp 20   Ht 5\' 3"  (1.6 m)   Wt 93.5 kg   LMP 03/06/2018 (Approximate)   BMI 36.51 kg/m  Gen: mildly uncomfortable appearing  RN Check at around 3:00pm: 6/80/-2  Assessment and Plan: 33 y.o. [id number] [redacted]w[redacted]d here for elective IOL  Labor: s/p AROM, continue pitocin -- pain control: desires natural delivery -- PPH Risk: low  Fetal Well-Being: EFW 7-8lbs by Leopolds. Cephalic by prior exam.  -- Category 1 - continuous fetal monitoring  -- GBS negative  Orlene Plum, MD Family Medicine Resident 3:08 PM

## 2018-12-06 MED ORDER — DOCUSATE SODIUM 100 MG PO CAPS: 100.0000 mg | ORAL_CAPSULE | Freq: Two times a day (BID) | ORAL | 0 refills | Status: DC

## 2018-12-06 MED ORDER — MEASLES, MUMPS & RUBELLA VAC IJ SOLR: 0.5000 mL | Freq: Once | INTRAMUSCULAR | Status: DC

## 2018-12-06 MED ORDER — IBUPROFEN 600 MG PO TABS: 600.0000 mg | ORAL_TABLET | Freq: Four times a day (QID) | ORAL | 0 refills | Status: DC

## 2018-12-06 NOTE — Discharge Instructions (Signed)

## 2018-12-06 NOTE — Lactation Note (Signed)
This note was copied from a baby's chart. Lactation Consultation Note  Patient Name: Dana Lindsey Date: 12/06/2018 Reason for consult: Initial assessment;Term  P4 mother whose infant is now 68 hours old.  Mother breast fed her other three children ranging in time from 2 months-5 months with the last child.  She hopes to breast feed this baby for 6 months but is choosing to breast/bottle.  Mother stated she has a lower supply of milk and this has always been the situation with each child.  Spoke with mother about how to obtain the best milk supply possible.  Encouraged her to feed 8-12 times/24 hours or sooner if he shows cues.  Mother is familiar with feeding cues and hand expression.  Colostrum container provided and milk storage times reviewed.  Finger feeding demonstrated.  Mother had an unopened bottle of formula on her bedside table.  Discussed with her the importance of exclusively breast feeding at this time to encourage a full milk supply.  Offered to set up the DEBP but she politely declined.  She seems receptive to breast feeding only while in the hospital.  Encouraged her to call for lactation assistance as needed.  Father present.   Maternal Data Formula Feeding for Exclusion: Yes Reason for exclusion: Mother's choice to formula and breast feed on admission Has patient been taught Hand Expression?: Yes Does the patient have breastfeeding experience prior to this delivery?: Yes  Feeding Feeding Type: Bottle Fed - Formula Nipple Type: Slow - flow  LATCH Score                   Interventions    Lactation Tools Discussed/Used     Consult Status Consult Status: Follow-up Date: 12/07/18 Follow-up type: In-patient    Dana Lindsey 12/06/2018, 10:56 AM

## 2018-12-06 NOTE — Plan of Care (Signed)
Patient appropriate for discharge.

## 2018-12-10 ENCOUNTER — Encounter: Payer: Medicaid Other | Admitting: Family Medicine

## 2019-01-07 ENCOUNTER — Ambulatory Visit: Payer: Medicaid Other | Admitting: Family Medicine

## 2019-01-13 ENCOUNTER — Ambulatory Visit (INDEPENDENT_AMBULATORY_CARE_PROVIDER_SITE_OTHER): Payer: Medicaid Other | Admitting: Family Medicine

## 2019-01-13 ENCOUNTER — Encounter: Payer: Self-pay | Admitting: Family Medicine

## 2019-01-13 VITALS — BP 139/84 | HR 72 | Wt 197.0 lb

## 2019-01-13 DIAGNOSIS — Z01812 Encounter for preprocedural laboratory examination: Secondary | ICD-10-CM

## 2019-01-13 DIAGNOSIS — Z1389 Encounter for screening for other disorder: Secondary | ICD-10-CM | POA: Diagnosis not present

## 2019-01-13 DIAGNOSIS — Z3202 Encounter for pregnancy test, result negative: Secondary | ICD-10-CM

## 2019-01-13 DIAGNOSIS — Z30430 Encounter for insertion of intrauterine contraceptive device: Secondary | ICD-10-CM | POA: Diagnosis not present

## 2019-01-13 LAB — POCT URINE PREGNANCY: Preg Test, Ur: NEGATIVE

## 2019-01-13 MED ORDER — LEVONORGESTREL 19.5 MCG/DAY IU IUD
INTRAUTERINE_SYSTEM | Freq: Once | INTRAUTERINE | Status: AC
  Administered 2019-01-13: 14:00:00 1 via INTRAUTERINE

## 2019-01-13 NOTE — Progress Notes (Signed)
Post Partum Exam  Dana Lindsey is a 33 y.o. L2G4010 female who presents for a postpartum visit. She is 5 weeks postpartum following a spontaneous vaginal delivery. I have fully reviewed the prenatal and intrapartum course. The delivery was at 39.1 gestational weeks.  Anesthesia: epidural. Postpartum course has been uneventful. Baby's course has been uneventful. Baby is feeding by . Bleeding thin lochia. Bowel function is normal. Bladder function is normal. Patient is not sexually active. Contraception method is undecided. Postpartum depression screening:neg  The following portions of the patient's history were reviewed and updated as appropriate: allergies, current medications, past family history, past medical history, past social history, past surgical history and problem list. Last pap smear done 12/17/16 and was Normal  Review of Systems Pertinent items are noted in HPI.    Objective:  Blood pressure 139/84, pulse 72, weight 197 lb (89.4 kg), unknown if currently breastfeeding.  General:  alert, cooperative and no distress  Lungs: clear to auscultation bilaterally  Heart:  regular rate and rhythm, S1, S2 normal, no murmur, click, rub or gallop  Abdomen: soft, non-tender; bowel sounds normal; no masses,  no organomegaly   Vulva:  normal  Vagina: normal vagina, no discharge, exudate, lesion, or erythema  Cervix:  multiparous appearance        IUD Procedure Note Patient identified, informed consent performed, signed copy in chart, time out was performed.  Urine pregnancy test negative.  Speculum placed in the vagina.  Cervix visualized.  Cleaned with Betadine x 2.  Grasped anteriorly with a single tooth tenaculum.  Uterus sounded to 8 cm.  Liletta  IUD placed per manufacturer's recommendations.  Strings trimmed to 3 cm. Tenaculum was removed, good hemostasis noted.  Patient tolerated procedure well.   Patient given post procedure instructions and Liletta care card with expiration date.   Patient is asked to check IUD strings periodically and follow up in 4-6 weeks for IUD check.  Assessment:    normal postpartum exam.  Plan:   1. Contraception: IUD 2. Follow up in: 1 month or as needed.

## 2019-01-26 ENCOUNTER — Telehealth: Payer: Self-pay

## 2019-01-26 NOTE — Telephone Encounter (Signed)
Pt called the office stating that she had an IUD inserted on 01/13/19. Pt is concerned because she is still bleeding. Informed pt that the spotting/bleeding is normal and it could last for to 3-6 months. Understanding was voiced.  Maveryck Bahri l Elyshia Kumagai, CMA

## 2019-02-11 ENCOUNTER — Ambulatory Visit (INDEPENDENT_AMBULATORY_CARE_PROVIDER_SITE_OTHER): Payer: Medicaid Other | Admitting: Family Medicine

## 2019-02-11 ENCOUNTER — Encounter: Payer: Self-pay | Admitting: Family Medicine

## 2019-02-11 ENCOUNTER — Other Ambulatory Visit: Payer: Self-pay

## 2019-02-11 VITALS — BP 122/86 | HR 70

## 2019-02-11 DIAGNOSIS — Z30431 Encounter for routine checking of intrauterine contraceptive device: Secondary | ICD-10-CM

## 2019-02-11 NOTE — Progress Notes (Signed)
   Subjective:   Patient Name: Dana Lindsey, female   DOB: 09-Dec-1985, 33 y.o.  MRN: [id number]  HPI Patient here for an IUD check.  She had the Ashley IUD placed 1 month ago.  She reports spotting.   Review of Systems  Constitutional: Negative for fever and chills.  Gastrointestinal: Negative for abdominal pain.  Genitourinary: Negative for vaginal discharge, vaginal pain, pelvic pain and dyspareunia.        Objective:   Physical Exam  Constitutional: She appears well-developed and well-nourished.  HENT:  Head: Normocephalic and atraumatic.  Abdominal: Soft. There is no tenderness. There is no guarding.  Genitourinary: There is no rash, tenderness or lesion on the right labia. There is no rash, tenderness or lesion on the left labia. No erythema or tenderness in the vagina. No foreign body around the vagina. No signs of injury around the vagina. No vaginal discharge found.    Skin: Skin is warm and dry.  Psychiatric: She has a normal mood and affect. Her behavior is normal. Judgment and thought content normal.       Assessment & Plan:  1. IUD check up IUD in place.  Pt to call with any other problems.  Recheck in 1 year.

## 2019-02-11 NOTE — Progress Notes (Signed)
Patient states she has had spotting since insertion. Patient presents for string check. Kathrene Alu RN

## 2019-11-30 ENCOUNTER — Telehealth: Payer: Self-pay

## 2019-11-30 NOTE — Telephone Encounter (Signed)
Patient called and has had her IUD for approximately 1 year. Patient states that she is having some abdominal pain- she states that she has not had her normal monthly spotting/bleeding that she usually has with her IUD. Patient states she is having some pregnancy symptoms that she has had with her other children. Patient would like IUD checked. She has performed pregnancy tests at home that have been negative. Patient given appointment for evaluation. Kathrene Alu RN

## 2019-12-01 ENCOUNTER — Other Ambulatory Visit: Payer: Self-pay

## 2019-12-01 ENCOUNTER — Other Ambulatory Visit (HOSPITAL_COMMUNITY)
Admission: RE | Admit: 2019-12-01 | Discharge: 2019-12-01 | Disposition: A | Discharge status: Home / Self Care | Payer: Medicaid Other | Source: Ambulatory Visit | Attending: Family Medicine | Admitting: Family Medicine

## 2019-12-01 ENCOUNTER — Ambulatory Visit (INDEPENDENT_AMBULATORY_CARE_PROVIDER_SITE_OTHER): Payer: Medicaid Other | Admitting: Family Medicine

## 2019-12-01 ENCOUNTER — Encounter: Payer: Self-pay | Admitting: Family Medicine

## 2019-12-01 VITALS — BP 129/74 | HR 63 | Ht 63.0 in | Wt 208.0 lb

## 2019-12-01 DIAGNOSIS — R102 Pelvic and perineal pain: Secondary | ICD-10-CM | POA: Diagnosis present

## 2019-12-01 NOTE — Progress Notes (Signed)
Pt states she has been experiencing abdominal x 2 months. chiquita l wilson, CMA

## 2019-12-01 NOTE — Progress Notes (Signed)
   Subjective:    Patient ID: Dana Lindsey, female    DOB: 09/25/1985, 34 y.o.   MRN: [id number]  HPI Patient having some intermittent cramping on either side of the pelvis over past 2 months. Worse with bending over or rolling over in bed. No change in bleeding - no breakthrough bleeding or spotting.    Review of Systems     Objective:   Physical Exam Vitals reviewed. Exam conducted with a chaperone present.  Constitutional:      Appearance: Normal appearance.  Abdominal:     Hernia: There is no hernia in the left inguinal area or right inguinal area.  Genitourinary:    Labia:        Right: No rash, tenderness or lesion.        Left: No rash, tenderness or lesion.      Vagina: No signs of injury and foreign body. No vaginal discharge, erythema, tenderness, bleeding, lesions or prolapsed vaginal walls.     Cervix: No cervical motion tenderness, discharge, friability, lesion, erythema, cervical bleeding or eversion.     Uterus: Normal.      Adnexa:        Right: Tenderness present. No mass or fullness.         Left: Tenderness present. No mass or fullness.       Lymphadenopathy:     Lower Body: No right inguinal adenopathy. No left inguinal adenopathy.  Neurological:     Mental Status: She is alert.        Assessment & Plan:  1. Pelvic pain in female Uncertain of etiology. Will check culture for infection and [place] for placement of IUD. ? Ovarian cysts. - US PELVIS TRANSVAGINAL NON-OB (TV ONLY); Future - Cervicovaginal ancillary only( [place])

## 2019-12-02 LAB — CERVICOVAGINAL ANCILLARY ONLY
Bacterial Vaginitis (gardnerella): POSITIVE — AB
Candida Glabrata: NEGATIVE
Candida Vaginitis: NEGATIVE
Comment: NEGATIVE
Comment: NEGATIVE
Comment: NEGATIVE

## 2019-12-02 MED ORDER — METRONIDAZOLE 500 MG PO TABS: 500.0000 mg | ORAL_TABLET | Freq: Two times a day (BID) | ORAL | 0 refills | Status: DC

## 2019-12-02 NOTE — Addendum Note (Signed)
Addended by: Truett Mainland on: 12/02/2019 12:30 PM   Modules accepted: Orders

## 2019-12-08 ENCOUNTER — Ambulatory Visit (HOSPITAL_BASED_OUTPATIENT_CLINIC_OR_DEPARTMENT_OTHER)
Admission: RE | Admit: 2019-12-08 | Discharge: 2019-12-08 | Disposition: A | Discharge status: Home / Self Care | Payer: Medicaid Other | Source: Ambulatory Visit | Attending: Family Medicine | Admitting: Family Medicine

## 2019-12-08 ENCOUNTER — Other Ambulatory Visit: Payer: Self-pay

## 2019-12-08 DIAGNOSIS — R102 Pelvic and perineal pain: Secondary | ICD-10-CM

## 2020-03-06 ENCOUNTER — Other Ambulatory Visit: Payer: Self-pay

## 2020-03-06 ENCOUNTER — Telehealth (INDEPENDENT_AMBULATORY_CARE_PROVIDER_SITE_OTHER): Payer: Medicaid Other | Admitting: Registered Nurse

## 2020-03-06 ENCOUNTER — Encounter: Payer: Self-pay | Admitting: Registered Nurse

## 2020-03-06 DIAGNOSIS — J22 Unspecified acute lower respiratory infection: Secondary | ICD-10-CM | POA: Diagnosis not present

## 2020-03-06 DIAGNOSIS — R062 Wheezing: Secondary | ICD-10-CM | POA: Diagnosis not present

## 2020-03-06 MED ORDER — AZITHROMYCIN 250 MG PO TABS: ORAL_TABLET | ORAL | 0 refills | Status: DC

## 2020-03-06 MED ORDER — MONTELUKAST SODIUM 10 MG PO TABS: 10.0000 mg | ORAL_TABLET | Freq: Every day | ORAL | 3 refills | Status: DC

## 2020-03-06 MED ORDER — ALBUTEROL SULFATE HFA 108 (90 BASE) MCG/ACT IN AERS: 2.0000 | INHALATION_SPRAY | Freq: Four times a day (QID) | RESPIRATORY_TRACT | 3 refills | Status: DC | PRN

## 2020-03-06 NOTE — Patient Instructions (Signed)
° ° ° °  If you have lab work done today you will be contacted with your lab results within the next 2 weeks.  If you have not heard from us then please contact us. The fastest way to get your results is to register for My Chart. ° ° °IF you received an x-ray today, you will receive an invoice from [facility]. Please contact [facility] at [phone] with questions or concerns regarding your invoice.  ° °IF you received labwork today, you will receive an invoice from [facility]. Please contact [facility] at [phone] with questions or concerns regarding your invoice.  ° °Our billing staff will not be able to assist you with questions regarding bills from these companies. ° °You will be contacted with the lab results as soon as they are available. The fastest way to get your results is to activate your My Chart account. Instructions are located on the last page of this paperwork. If you have not heard from us regarding the results in 2 weeks, please contact this office. °  ° ° ° °

## 2020-03-08 ENCOUNTER — Encounter: Payer: Self-pay | Admitting: *Deleted

## 2020-03-08 ENCOUNTER — Telehealth: Payer: Self-pay | Admitting: Registered Nurse

## 2020-03-08 NOTE — Telephone Encounter (Signed)
Error

## 2020-03-09 ENCOUNTER — Encounter: Payer: Self-pay | Admitting: Family Medicine

## 2020-03-09 ENCOUNTER — Ambulatory Visit (INDEPENDENT_AMBULATORY_CARE_PROVIDER_SITE_OTHER): Payer: Medicaid Other

## 2020-03-09 ENCOUNTER — Other Ambulatory Visit: Payer: Self-pay

## 2020-03-09 ENCOUNTER — Ambulatory Visit: Payer: Medicaid Other | Admitting: Family Medicine

## 2020-03-09 VITALS — BP 128/89 | HR 77 | Temp 98.2°F | Ht 63.0 in | Wt 214.6 lb

## 2020-03-09 DIAGNOSIS — R059 Cough, unspecified: Secondary | ICD-10-CM

## 2020-03-09 DIAGNOSIS — R05 Cough: Secondary | ICD-10-CM

## 2020-03-09 DIAGNOSIS — M542 Cervicalgia: Secondary | ICD-10-CM | POA: Diagnosis not present

## 2020-03-09 DIAGNOSIS — S161XXA Strain of muscle, fascia and tendon at neck level, initial encounter: Secondary | ICD-10-CM | POA: Diagnosis not present

## 2020-03-09 MED ORDER — TRAMADOL HCL 50 MG PO TABS: 50.0000 mg | ORAL_TABLET | Freq: Three times a day (TID) | ORAL | 0 refills | Status: AC | PRN

## 2020-03-09 MED ORDER — METHOCARBAMOL 500 MG PO TABS: 500.0000 mg | ORAL_TABLET | Freq: Four times a day (QID) | ORAL | 0 refills | Status: DC

## 2020-03-09 MED ORDER — PREDNISONE 20 MG PO TABS: ORAL_TABLET | ORAL | 0 refills | Status: DC

## 2020-03-09 NOTE — Progress Notes (Signed)
Patient ID: Dana Lindsey, female    DOB: 08/20/1986  Age: 34 y.o. MRN: [id number]  Chief Complaint  Patient presents with  . Neck Pain    Pt stated that she has been having some neck pain since 6/27. She stated that she went to cough and she felt the pull in her neck and it has been hurting since then.    Subjective:  34 year old lady who has had a bad cough for a little over a week.  She was treated by Maximiano Coss on a phone visit several days ago.  Sunday she had a bad cough and felt sudden severe acute pain in the neck.  This is in the upper portion of the neck, more on the left side.  It hurts to move her neck in all.  The cough seemed like it was from dry hot air and dust, but it is continued to persist.  Every time she coughs she gets severe pain.  Sometimes it goes down into her left scapular region.  No radiculopathy down the arms.  She has difficulty positioning herself at night.  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  Has not had neck problems in the past.  The cough is nonproductive but persistent.  The pain has been so bad that has overridden her concerns about the cough.  She is using an inhaler, montelukast, and azithromycin.  Current allergies, medications, problem list, past/family and social histories reviewed.  Objective:  BP 128/89   Pulse 77   Temp 98.2 F (36.8 C) (Temporal)   Ht 5\' 3"  (1.6 m)   Wt 214 lb 9.6 oz (97.3 kg)   LMP 02/28/2020   SpO2 96%   BMI 38.01 kg/m  Patient is in obvious pain.  Whenever she moves her neck in the least she has severe pain, as well as every time she coughs.  She is frequently coughing and on productive dry cough.  Throat clear.  Neck is being held rigidly.  She is tender in the upper cervical spine and left paraspinous area.  The pain radiates around to her left temple and down to the left trapezius areas.  Grip is good.  Range of motion is good in her arms.  No radiculopathy.  Chest is clear to auscultation.  Hacking dry  cough. Assessment & Plan:   Assessment: 1. Cervical pain (neck)   2. Cough   3. Cervical strain, acute, initial encounter       Plan: See instructions. Orders Placed This Encounter  Procedures  . DG Cervical Spine Complete    Standing Status:   Future    Number of Occurrences:   1    Standing Expiration Date:   03/09/2021    Order Specific Question:   Reason for Exam (SYMPTOM  OR DIAGNOSIS REQUIRED)    Answer:   cervical pain upper left neck, acute for 5 days since severe cough    Order Specific Question:   Is the patient pregnant?    Answer:   No    Order Specific Question:   Preferred imaging location?    Answer:   External    No orders of the defined types were placed in this encounter.        Patient Instructions   Take prednisone 20 mg 3 each morning for 2 days, then 2 daily for 2 days, then 1 daily for 2 days.  Best taken in the morning after breakfast, but today go ahead and take your first  pills when you get them.  These are for inflammation and will help both the neck and the lungs I hope.  Take methocarbamol muscle relaxant 500 mg 1 4 times daily.  Take Tylenol 500 mg (acetaminophen) 2 pills 3 times daily as needed for pain.  Do not exceed 3000 mg in 24 hours.  Take Aleve 220 mg 2 pills twice daily for pain.  Take with food.  (Naproxen)  For severe pain not relieved by the above you can take tramadol 1 every 6 hours if needed.  This might be mildly sedating.  Apply alternating ice packs and heat for about 10 to 15 minutes each several times daily  Return if not improving.   If you have lab work done today you will be contacted with your lab results within the next 2 weeks.  If you have not heard from [place] then please contact us. The fastest way to get your results is to register for My Chart.   IF you received an x-ray today, you will receive an invoice from [facility]. Please contact [facility] at [phone] with questions or  concerns regarding your invoice.   IF you received labwork today, you will receive an invoice from [facility]. Please contact [facility] at [phone] with questions or concerns regarding your invoice.   Our billing staff will not be able to assist you with questions regarding bills from these companies.  You will be contacted with the lab results as soon as they are available. The fastest way to get your results is to activate your My Chart account. Instructions are located on the last page of this paperwork. If you have not heard from [place] regarding the results in 2 weeks, please contact this office.        Return if symptoms worsen or fail to improve.   Ruben Reason, MD 03/09/2020

## 2020-03-09 NOTE — Patient Instructions (Addendum)
X-ray of the neck appears normal.  Take prednisone 20 mg 3 each morning for 2 days, then 2 daily for 2 days, then 1 daily for 2 days.  Best taken in the morning after breakfast, but today go ahead and take your first pills when you get them.  These are for inflammation and will help both the neck and the lungs I hope.  Take methocarbamol muscle relaxant 500 mg 1 4 times daily.  Take Tylenol 500 mg (acetaminophen) 2 pills 3 times daily as needed for pain.  Do not exceed 3000 mg in 24 hours.  Take Aleve 220 mg 2 pills twice daily for pain.  Take with food.  (Naproxen)  For severe pain not relieved by the above you can take tramadol 1 every 6 hours if needed.  This might be mildly sedating.  Apply alternating ice packs and heat for about 10 to 15 minutes each several times daily  Return if not improving.   If you have lab work done today you will be contacted with your lab results within the next 2 weeks.  If you have not heard from [place] then please contact us. The fastest way to get your results is to register for My Chart.   IF you received an x-ray today, you will receive an invoice from [facility]. Please contact [facility] at [phone] with questions or concerns regarding your invoice.   IF you received labwork today, you will receive an invoice from [facility]. Please contact [facility] at [phone] with questions or concerns regarding your invoice.   Our billing staff will not be able to assist you with questions regarding bills from these companies.  You will be contacted with the lab results as soon as they are available. The fastest way to get your results is to activate your My Chart account. Instructions are located on the last page of this paperwork. If you have not heard from [place] regarding the results in 2 weeks, please contact this office.

## 2020-03-13 ENCOUNTER — Telehealth: Payer: Self-pay

## 2020-03-13 NOTE — Telephone Encounter (Signed)
Contacted [facility] for PA of albuterol sulfate 108 (90) base mcg/act inhaler.  ProAir HFA 108 (90) base mcg/ act is on pt's formulary . If it is preferred to continue with PA for original inhaler. Please advice

## 2020-03-18 ENCOUNTER — Other Ambulatory Visit: Payer: Self-pay | Admitting: Registered Nurse

## 2020-03-18 DIAGNOSIS — R062 Wheezing: Secondary | ICD-10-CM

## 2020-03-18 MED ORDER — ALBUTEROL SULFATE HFA 108 (90 BASE) MCG/ACT IN AERS: 2.0000 | INHALATION_SPRAY | Freq: Four times a day (QID) | RESPIRATORY_TRACT | 5 refills | Status: DC | PRN

## 2020-03-18 NOTE — Telephone Encounter (Signed)
Sent ProAir  Thanks,  Kathrin Ruddy, NP

## 2020-04-23 ENCOUNTER — Ambulatory Visit (INDEPENDENT_AMBULATORY_CARE_PROVIDER_SITE_OTHER): Payer: Medicaid Other | Admitting: Family Medicine

## 2020-04-23 ENCOUNTER — Encounter: Payer: Self-pay | Admitting: Family Medicine

## 2020-04-23 VITALS — BP 122/83 | HR 74 | Temp 97.8°F | Resp 15 | Ht 63.25 in | Wt 215.6 lb

## 2020-04-23 DIAGNOSIS — E669 Obesity, unspecified: Secondary | ICD-10-CM

## 2020-04-23 DIAGNOSIS — Z1329 Encounter for screening for other suspected endocrine disorder: Secondary | ICD-10-CM

## 2020-04-23 DIAGNOSIS — R0982 Postnasal drip: Secondary | ICD-10-CM

## 2020-04-23 DIAGNOSIS — L659 Nonscarring hair loss, unspecified: Secondary | ICD-10-CM

## 2020-04-23 DIAGNOSIS — Z131 Encounter for screening for diabetes mellitus: Secondary | ICD-10-CM | POA: Diagnosis not present

## 2020-04-23 DIAGNOSIS — Z6837 Body mass index (BMI) 37.0-37.9, adult: Secondary | ICD-10-CM

## 2020-04-23 DIAGNOSIS — N898 Other specified noninflammatory disorders of vagina: Secondary | ICD-10-CM

## 2020-04-23 MED ORDER — FLUTICASONE PROPIONATE 50 MCG/ACT NA SUSP: 2.0000 | Freq: Every day | NASAL | 6 refills | Status: DC

## 2020-04-23 NOTE — Patient Instructions (Addendum)
I will check some labs, but here are options for weight loss:  Dennard Nip, MD Medical Weight Loss Management  . [phone] .  [facility] also has information about surgical weight loss programs if needed. [email or web address]  Try flonase nasal spray - 1 spray in each nostril every morning, with option of repeat at night. That should help itching in throat if allergy related.   Try hamstring stretches twice per day. Supportive shoes without a high heel for now. Recheck in next few weeks.   If there are any signs of infection on vaginal swab, I can send in antibiotics.  I should have those results tomorrow.  Return to the clinic or go to the nearest emergency room if any of your symptoms worsen or new symptoms occur.  Thank you for coming in today and look forward to seeing you in a few weeks.  Let me know if there are questions prior to that time.    If you have lab work done today you will be contacted with your lab results within the next 2 weeks.  If you have not heard from [place] then please contact us. The fastest way to get your results is to register for My Chart.   IF you received an x-ray today, you will receive an invoice from [facility]. Please contact [facility] at [phone] with questions or concerns regarding your invoice.   IF you received labwork today, you will receive an invoice from [place]. Please contact [facility] at [phone] with questions or concerns regarding your invoice.   Our billing staff will not be able to assist you with questions regarding bills from these companies.  You will be contacted with the lab results as soon as they are available. The fastest way to get your results is to activate your My Chart account. Instructions are located on the last page of this paperwork. If you have not heard from [place] regarding the results in 2 weeks, please contact this office.

## 2020-04-23 NOTE — Progress Notes (Signed)
Subjective:  Patient ID: Dana Lindsey, female    DOB: Feb 22, 1986  Age: 34 y.o. MRN: [id number]  CC:  Chief Complaint  Patient presents with  . Establish Care    pt is concerened that she is overweight and is having pain in her legs, having trouble losing weight and feels this may be contributing, pt also having skin irritation in her genitle area she believes from her daily pads  . Allergies    pt has itch in her throat all throughout summer and has not found anything that helps other than honey     HPI Dana Lindsey presents for   Establish care, concern above.   Obesity: Trouble losing weight. Keep gaining. Has 5 children (biological) youngest 59 and a 30 year old Keto diet 3 yrs ago. some weight loss, then returned with last child. Highest 219, current.  No specific diet - avoiding bread/sweets. Still some craving at times.  rare soda, sweet tea, rare fast food. Taking medication daily.  More hot natured, hair seems to fall out more past few years. Has thought about surgical options. May be cost prohibitive.   Body mass index is 37.89 kg/m. Wt Readings from Last 3 Encounters:  04/23/20 215 lb 9.6 oz (97.8 kg)  03/09/20 214 lb 9.6 oz (97.3 kg)  12/01/19 208 lb (94.3 kg)   Leg pains: Both legs back of thighs. Sometimes knees if squatting, feet hurt at times. No recent injury.  Pain in both thighs past week Foot pains for a long time. More past week. No treatments. No surgery.  New shoes past 3 days.    Genital itching/irritation: Past 2-3 days. Minimal discharge. No dysuria.  No abd pain.  Itching on outside. No blisters/rash. Long car ride last week for 6 hours.  No recent abx.  Tx: husband's cream - unsure of type - possible antifungal.   Throat itching: Improves with honey.  Noticed all summer.  No fever. Able to eat/drink ok.  No sneezing/runny nose, or itchy eyes. But does feel PND, phlegm.  Ears blocked at times in past. Some scale in ears at  times.  Past few years - same feeling during the summer.      History There are no problems to display for this patient.  Past Medical History:  Diagnosis Date  . Asthma    Phreesia 03/06/2020  . Cholecystolithiasis   . Ear infection   . Gastritis   . Shortness of breath    with exertion   Past Surgical History:  Procedure Laterality Date  . CHOLECYSTECTOMY  08/23/2012   Procedure: LAPAROSCOPIC CHOLECYSTECTOMY WITH INTRAOPERATIVE CHOLANGIOGRAM;  Surgeon: Edward Jolly, MD;  Location: [facility] OR;  Service: General;  Laterality: N/A;   No Known Allergies Prior to Admission medications   Medication Sig Start Date End Date Taking? Authorizing Provider  albuterol (PROAIR HFA) 108 (90 Base) MCG/ACT inhaler Inhale 2 puffs into the lungs every 6 (six) hours as needed for wheezing or shortness of breath. 03/18/20  Yes Maximiano Coss, NP  azithromycin (ZITHROMAX) 250 MG tablet Take 2 tabs on first day. Then take 1 tab daily. Finish entire supply. Patient not taking: Reported on 04/23/2020 03/06/20   Maximiano Coss, NP  methocarbamol (ROBAXIN) 500 MG tablet Take 1 tablet (500 mg total) by mouth 4 (four) times daily. For muscle relaxant Patient not taking: Reported on 04/23/2020 03/09/20   Posey Boyer, MD  metroNIDAZOLE (FLAGYL) 500 MG tablet Take 1 tablet (500 mg total) by  mouth 2 (two) times daily. Patient not taking: Reported on 04/23/2020 12/02/19   Truett Mainland, DO  montelukast (SINGULAIR) 10 MG tablet Take 1 tablet (10 mg total) by mouth at bedtime. Patient not taking: Reported on 04/23/2020 03/06/20   Maximiano Coss, NP  predniSONE (DELTASONE) 20 MG tablet Take 3 daily for 2 days, then 2 daily for 2 days, then 1 daily for 2 days.  Best taken earlier in the day with food.  For inflammation in neck and cough. Patient not taking: Reported on 04/23/2020 03/09/20   Posey Boyer, MD  Prenatal Vit-Fe Fumarate-FA (PRENATAL VITAMIN PO) Take 1 tablet by mouth daily.  Patient not taking:  Reported on 04/23/2020    [provider]   Social History   Socioeconomic History  . Marital status: Married    Spouse name: Not on file  . Number of children: Not on file  . Years of education: Not on file  . Highest education level: Not on file  Occupational History  . Not on file  Tobacco Use  . Smoking status: Never Smoker  . Smokeless tobacco: Never Used  Vaping Use  . Vaping Use: Never used  Substance and Sexual Activity  . Alcohol use: No  . Drug use: No  . Sexual activity: Yes  Other Topics Concern  . Not on file  Social History Narrative  . Not on file   Social Determinants of Health   Financial Resource Strain:   . Difficulty of Paying Living Expenses:   Food Insecurity:   . Worried About Charity fundraiser in the Last Year:   . Arboriculturist in the Last Year:   Transportation Needs:   . Film/video editor (Medical):   Marland Kitchen Lack of Transportation (Non-Medical):   Physical Activity:   . Days of Exercise per Week:   . Minutes of Exercise per Session:   Stress:   . Feeling of Stress :   Social Connections:   . Frequency of Communication with Friends and Family:   . Frequency of Social Gatherings with Friends and Family:   . Attends Religious Services:   . Active Member of Clubs or Organizations:   . Attends Archivist Meetings:   Marland Kitchen Marital Status:   Intimate Partner Violence:   . Fear of Current or Ex-Partner:   . Emotionally Abused:   Marland Kitchen Physically Abused:   . Sexually Abused:     Review of Systems Per HPI.   Objective:   Vitals:   04/23/20 1506  BP: 122/83  Pulse: 74  Resp: 15  Temp: 97.8 F (36.6 C)  TempSrc: Temporal  SpO2: 98%  Weight: 215 lb 9.6 oz (97.8 kg)  Height: 5' 3.25" (1.607 m)    Physical Exam Vitals reviewed.  Constitutional:      General: She is not in acute distress.    Appearance: She is well-developed. She is obese. She is not ill-appearing or toxic-appearing.  HENT:     Head: Normocephalic  and atraumatic.  Cardiovascular:     Rate and Rhythm: Normal rate.  Pulmonary:     Effort: Pulmonary effort is normal.  Abdominal:     Tenderness: There is no abdominal tenderness.  Musculoskeletal:     Comments: Bilateral feet, no focal bony tenderness or specific area of soft tissue swelling appreciated.  Does feel better in the metatarsal heads with lateral squeeze, not worse.  Metatarsals, navicular nontender, ankle nontender.  Describes area of thighs in hamstrings  as area of discomfort.  Some improvement with demonstrated hamstring stretches.  Neurological:     Mental Status: She is alert and oriented to person, place, and time.  Psychiatric:        Mood and Affect: Mood normal.        Behavior: Behavior normal.    45 minutes spent during visit, chart review/completion. greater than 50% counseling and assimilation of information, chart review, and discussion of plan.   Results for orders placed or performed in visit on 04/23/20  WET PREP FOR [place], YEAST, CLUE   Specimen: Blood   VA  Result Value Ref Range   Trichomonas Exam Negative Negative   Yeast Exam Negative Negative   Clue Cell Exam Negative Negative  TSH  Result Value Ref Range   TSH 1.070 0.450 - 4.500 uIU/mL  Comprehensive metabolic panel  Result Value Ref Range   Glucose 86 65 - 99 mg/dL   BUN 15 6 - 20 mg/dL   Creatinine, Ser 0.58 0.57 - 1.00 mg/dL   GFR calc non Af Amer 119 >59 mL/min/1.73   GFR calc Af Amer 137 >59 mL/min/1.73   BUN/Creatinine Ratio 26 (H) 9 - 23   Sodium 142 134 - 144 mmol/L   Potassium 4.0 3.5 - 5.2 mmol/L   Chloride 104 96 - 106 mmol/L   CO2 20 20 - 29 mmol/L   Calcium 9.7 8.7 - 10.2 mg/dL   Total Protein 7.7 6.0 - 8.5 g/dL   Albumin 4.6 3.8 - 4.8 g/dL   Globulin, Total 3.1 1.5 - 4.5 g/dL   Albumin/Globulin Ratio 1.5 1.2 - 2.2   Bilirubin Total 0.4 0.0 - 1.2 mg/dL   Alkaline Phosphatase 65 48 - 121 IU/L   AST 15 0 - 40 IU/L   ALT 15 0 - 32 IU/L  Hemoglobin A1c  Result Value  Ref Range   Hgb A1c MFr Bld 5.2 4.8 - 5.6 %   Est. average glucose Bld gHb Est-mCnc 103 mg/dL     Assessment & Plan:  Margarine Grosshans Coral is a 34 y.o. female . Vaginal irritation - Plan: WET PREP FOR [place], YEAST, CLUE Vaginal discharge - Plan: WET PREP FOR [place], YEAST, CLUE  -Wet prep reassuring.  Possible contact/irritation.  Mild soap recommended, RTC precautions.  Class 2 obesity without serious comorbidity with body mass index (BMI) of 37.0 to 37.9 in adult, unspecified obesity type - Plan: TSH  -TSH reassuring.  Option of meeting with medical bariatric specialist or surgical bariatric specialist with number provided.  PND (post-nasal drip) - Plan: fluticasone (FLONASE) 50 MCG/ACT nasal spray  -Likely cause of throat irritation, trial of Flonase 1 to 2 sprays per nostril daily with RTC precautions  Screening for diabetes mellitus - Plan: Comprehensive metabolic panel, Hemoglobin A1c Reassuring labs.  Screening for thyroid disorder - Plan: TSH Hair loss - Plan: TSH  -TSH normal, can discuss hair symptoms further next visit.  Meds ordered this encounter  Medications  . fluticasone (FLONASE) 50 MCG/ACT nasal spray    Sig: Place 2 sprays into both nostrils daily.    Dispense:  16 g    Refill:  6   Patient Instructions   I will check some labs, but here are options for weight loss:  Dennard Nip, MD Medical Weight Loss Management  . [phone] .  [facility] also has information about surgical weight loss programs if needed. [email or web address]  Try flonase nasal spray - 1 spray in each nostril every morning, with  option of repeat at night. That should help itching in throat if allergy related.   Try hamstring stretches twice per day. Supportive shoes without a high heel for now. Recheck in next few weeks.   If there are any signs of infection on vaginal swab, I can send in antibiotics.  I should have those  results tomorrow.  Return to the clinic or go to the nearest emergency room if any of your symptoms worsen or new symptoms occur.  Thank you for coming in today and look forward to seeing you in a few weeks.  Let me know if there are questions prior to that time.    If you have lab work done today you will be contacted with your lab results within the next 2 weeks.  If you have not heard from [place] then please contact us. The fastest way to get your results is to register for My Chart.   IF you received an x-ray today, you will receive an invoice from [facility]. Please contact [facility] at [phone] with questions or concerns regarding your invoice.   IF you received labwork today, you will receive an invoice from [facility] [place]. Please contact [facility] at [phone] with questions or concerns regarding your invoice.   Our billing staff will not be able to assist you with questions regarding bills from these companies.  You will be contacted with the lab results as soon as they are available. The fastest way to get your results is to activate your My Chart account. Instructions are located on the last page of this paperwork. If you have not heard from [place] regarding the results in 2 weeks, please contact this office.         Signed, Merri Ray, MD [facility]

## 2020-04-24 ENCOUNTER — Encounter: Payer: Self-pay | Admitting: Family Medicine

## 2020-04-24 LAB — COMPREHENSIVE METABOLIC PANEL
ALT: 15 IU/L (ref 0–32)
AST: 15 IU/L (ref 0–40)
Albumin/Globulin Ratio: 1.5 (ref 1.2–2.2)
Albumin: 4.6 g/dL (ref 3.8–4.8)
Alkaline Phosphatase: 65 IU/L (ref 48–121)
BUN/Creatinine Ratio: 26 — ABNORMAL HIGH (ref 9–23)
BUN: 15 mg/dL (ref 6–20)
Bilirubin Total: 0.4 mg/dL (ref 0.0–1.2)
CO2: 20 mmol/L (ref 20–29)
Calcium: 9.7 mg/dL (ref 8.7–10.2)
Chloride: 104 mmol/L (ref 96–106)
Creatinine, Ser: 0.58 mg/dL (ref 0.57–1.00)
GFR calc Af Amer: 137 mL/min/{1.73_m2} (ref >59)
GFR calc non Af Amer: 119 mL/min/{1.73_m2} (ref >59)
Globulin, Total: 3.1 g/dL (ref 1.5–4.5)
Glucose: 86 mg/dL (ref 65–99)
Potassium: 4 mmol/L (ref 3.5–5.2)
Sodium: 142 mmol/L (ref 134–144)
Total Protein: 7.7 g/dL (ref 6.0–8.5)

## 2020-04-24 LAB — HEMOGLOBIN A1C
Est. average glucose Bld gHb Est-mCnc: 103 mg/dL
Hgb A1c MFr Bld: 5.2 % (ref 4.8–5.6)

## 2020-04-24 LAB — WET PREP FOR TRICH, YEAST, CLUE
Clue Cell Exam: NEGATIVE
Trichomonas Exam: NEGATIVE
Yeast Exam: NEGATIVE

## 2020-04-24 LAB — TSH: TSH: 1.07 u[IU]/mL (ref 0.450–4.500)

## 2020-04-27 ENCOUNTER — Ambulatory Visit: Payer: Self-pay | Admitting: Family Medicine

## 2020-05-23 ENCOUNTER — Other Ambulatory Visit: Payer: Self-pay

## 2020-05-23 ENCOUNTER — Ambulatory Visit (INDEPENDENT_AMBULATORY_CARE_PROVIDER_SITE_OTHER): Payer: Medicaid Other | Admitting: Family Medicine

## 2020-05-23 ENCOUNTER — Encounter: Payer: Self-pay | Admitting: Family Medicine

## 2020-05-23 VITALS — BP 107/72 | HR 81 | Temp 98.4°F | Resp 15 | Ht 63.5 in | Wt 208.4 lb

## 2020-05-23 DIAGNOSIS — M79672 Pain in left foot: Secondary | ICD-10-CM

## 2020-05-23 DIAGNOSIS — F439 Reaction to severe stress, unspecified: Secondary | ICD-10-CM | POA: Diagnosis not present

## 2020-05-23 DIAGNOSIS — L659 Nonscarring hair loss, unspecified: Secondary | ICD-10-CM | POA: Diagnosis not present

## 2020-05-23 DIAGNOSIS — L219 Seborrheic dermatitis, unspecified: Secondary | ICD-10-CM

## 2020-05-23 MED ORDER — KETOCONAZOLE 2 % EX SHAM: 1.0000 "application " | MEDICATED_SHAMPOO | CUTANEOUS | 2 refills | Status: DC

## 2020-05-23 NOTE — Patient Instructions (Addendum)
See info on stress below. Can try antifungal shampoo to treat for seborrhea as that can cause dandruff and scale.  If continued hair loss, I do recommend follow up with dermatology. Let me know and I can refer.   I will refer you to foot specialist. Continue stretches and range of motion of your foot few times per day.   Medical Weight Loss Management   [phone]   Managing Stress, Adult Feeling a certain amount of stress is normal. Stress helps our body and mind get ready to deal with the demands of life. Stress hormones can motivate you to do well at work and meet your responsibilities. However severe or long-lasting (chronic) stress can affect your mental and physical health. Chronic stress puts you at higher risk for anxiety, depression, and other health problems like digestive problems, muscle aches, heart disease, high blood pressure, and stroke. What are the causes? Common causes of stress include:  Demands from work, such as deadlines, feeling overworked, or having long hours.  Pressures at home, such as money issues, disagreements with a spouse, or parenting issues.  Pressures from major life changes, such as divorce, moving, loss of a loved one, or chronic illness. You may be at higher risk for stress-related problems if you do not get enough sleep, are in poor health, do not have emotional support, or have a mental health disorder like anxiety or depression. How to recognize stress Stress can make you:  Have trouble sleeping.  Feel sad, anxious, irritable, or overwhelmed.  Lose your appetite.  Overeat or want to eat unhealthy foods.  Want to use drugs or alcohol. Stress can also cause physical symptoms, such as:  Sore, tense muscles, especially in the shoulders and neck.  Headaches.  Trouble breathing.  A faster heart rate.  Stomach pain, nausea, or vomiting.  Diarrhea or constipation.  Trouble concentrating. Follow these instructions at  home: Lifestyle  Identify the source of your stress and your reaction to it. See a therapist who can help you change your reactions.  When there are stressful events: ? Talk about it with family, friends, or co-workers. ? Try to think realistically about stressful events and not ignore them or overreact. ? Try to find the positives in a stressful situation and not focus on the negatives. ? Cut back on responsibilities at work and home, if possible. Ask for help from friends or family members if you need it.  Find ways to cope with stress, such as: ? Meditation. ? Deep breathing. ? Yoga or tai chi. ? Progressive muscle relaxation. ? Doing art, playing music, or reading. ? Making time for fun activities. ? Spending time with family and friends.  Get support from family, friends, or spiritual resources. Eating and drinking  Eat a healthy diet. This includes: ? Eating foods that are high in fiber, such as beans, whole grains, and fresh fruits and vegetables. ? Limiting foods that are high in fat and processed sugars, such as fried and sweet foods.  Do not skip meals or overeat.  Drink enough fluid to keep your urine pale yellow. Alcohol use  Do not drink alcohol if: ? Your health care provider tells you not to drink. ? You are pregnant, may be pregnant, or are planning to become pregnant.  Drinking alcohol is a way some people try to ease their stress. This can be dangerous, so if you drink alcohol: ? Limit how much you use to:  0-1 drink a day for women.  0-2  drinks a day for men. ? Be aware of how much alcohol is in your drink. In the [place] one drink equals one 12 oz bottle of beer (355 mL), one 5 oz glass of wine (148 mL), or one 1 oz glass of hard liquor (44 mL). Activity   Include 30 minutes of exercise in your daily schedule. Exercise is a good stress reducer.  Include time in your day for an activity that you find relaxing. Try taking a walk, going on a bike ride,  reading a book, or listening to music.  Schedule your time in a way that lowers stress, and keep a consistent schedule. Prioritize what is most important to get done. General instructions  Get enough sleep. Try to go to sleep and get up at about the same time every day.  Take over-the-counter and prescription medicines only as told by your health care provider.  Do not use any products that contain nicotine or tobacco, such as cigarettes, e-cigarettes, and chewing tobacco. If you need help quitting, ask your health care provider.  Do not use drugs or smoke to cope with stress.  Keep all follow-up visits as told by your health care provider. This is important. Where to find support  Talk with your health care provider about stress management or finding a support group.  Find a therapist to work with you on your stress management techniques. Contact a health care provider if:  Your stress symptoms get worse.  You are unable to manage your stress at home.  You are struggling to stop using drugs or alcohol. Get help right away if:  You may be a danger to yourself or others.  You have any thoughts of death or suicide. If you ever feel like you may hurt yourself or others, or have thoughts about taking your own life, get help right away. You can go to your nearest emergency department or call:  Your local emergency services (911 in the [place]  A suicide crisis helpline, such as the [facility] at (520)236-4595. This is open 24 hours a day. Summary  Feeling a certain amount of stress is normal, but severe or long-lasting (chronic) stress can affect your mental and physical health.  Chronic stress can put you at higher risk for anxiety, depression, and other health problems like digestive problems, muscle aches, heart disease, high blood pressure, and stroke.  You may be at higher risk for stress-related problems if you do not get enough sleep, are in poor  health, lack emotional support, or have a mental health disorder like anxiety or depression.  Identify the source of your stress and your reaction to it. Try talking about stressful events with family, friends, or co-workers, finding a coping method, or getting support from spiritual resources.  If you need more help, talk with your health care provider about finding a support group or a mental health therapist. This information is not intended to replace advice given to you by your health care provider. Make sure you discuss any questions you have with your health care provider. Document Revised: 03/23/2019 Document Reviewed: 03/23/2019 Elsevier Patient Education  [employer].   If you have lab work done today you will be contacted with your lab results within the next 2 weeks.  If you have not heard from [place] then please contact us. The fastest way to get your results is to register for My Chart.   IF you received an x-ray today, you will receive an  invoice from [facility]. Please contact [facility] at [phone] with questions or concerns regarding your invoice.   IF you received labwork today, you will receive an invoice from [place]. Please contact [facility] at [phone] with questions or concerns regarding your invoice.   Our billing staff will not be able to assist you with questions regarding bills from these companies.  You will be contacted with the lab results as soon as they are available. The fastest way to get your results is to activate your My Chart account. Instructions are located on the last page of this paperwork. If you have not heard from [place] regarding the results in 2 weeks, please contact this office.

## 2020-05-23 NOTE — Progress Notes (Signed)
Subjective:  Patient ID: Dana Lindsey, female    DOB: [birth year]  Age: 34 y.o. MRN: [id number]  CC:  Chief Complaint  Patient presents with  . Leg Pain    pt notes legs have been better after last visit doing stretches does note her Lt foot still causes problems after periods or rest she feels she needs to limp to get started     HPI Dana Lindsey presents for   Bilateral thigh pain: Discussed at her visit August 16, noticed behind her thighs sometimes knees of squatting, feet also hurt at times without apparent recent injury.  Was having pain in both thighs that previous week.  The pains have been off and on for a long time but more in the past week.  She was wearing new shoes the previous 3 days. On her exam last visit, there was some improvement in symptoms with demonstrated hamstring stretches, and did describe area of discomfort in the hamstrings.  Foot exam was reassuring with improvement in metatarsal heads with lateral squeezing, not worse.  Remainder of feet were nontender.  Leg pains have improved with hamstring stretches.  Still with some pain in her left foot, and after sitting for a while does feel like she limps initially to get started walking.  Improves with walking, but sore on top. No arch pain.  Stretching at night.    Postnasal drip: Flonase recommended last visit as possible cause of throat irritation may have been PND. Has helped a lot. No further throat irritation.   Hair loss: Felt like hair has been falling out more the past few years, normal thyroid testing last visit. Some stress over past few years. Not overwhelmed/depressed. 5 children. Busy. Feels like trouble managing stress at times, but better management recently.  No patches of hair loss.      History There are no problems to display for this patient.  Past Medical History:  Diagnosis Date  . Asthma    Phreesia 03/06/2020  . Cholecystolithiasis   . Ear infection   . Gastritis   .  Shortness of breath    with exertion   Past Surgical History:  Procedure Laterality Date  . CHOLECYSTECTOMY  08/23/2012   Procedure: LAPAROSCOPIC CHOLECYSTECTOMY WITH INTRAOPERATIVE CHOLANGIOGRAM;  Surgeon: Edward Jolly, MD;  Location: [facility] OR;  Service: General;  Laterality: N/A;   No Known Allergies Prior to Admission medications   Medication Sig Start Date End Date Taking? Authorizing Provider  albuterol (PROAIR HFA) 108 (90 Base) MCG/ACT inhaler Inhale 2 puffs into the lungs every 6 (six) hours as needed for wheezing or shortness of breath. 03/18/20  Yes Maximiano Coss, NP  fluticasone (FLONASE) 50 MCG/ACT nasal spray Place 2 sprays into both nostrils daily. 04/23/20  Yes Wendie Agreste, MD  montelukast (SINGULAIR) 10 MG tablet Take 1 tablet (10 mg total) by mouth at bedtime. 03/06/20  Yes Maximiano Coss, NP   Social History   Socioeconomic History  . Marital status: Married    Spouse name: Not on file  . Number of children: Not on file  . Years of education: Not on file  . Highest education level: Not on file  Occupational History  . Not on file  Tobacco Use  . Smoking status: Never Smoker  . Smokeless tobacco: Never Used  Vaping Use  . Vaping Use: Never used  Substance and Sexual Activity  . Alcohol use: No  . Drug use: No  . Sexual activity: Yes  Other  Topics Concern  . Not on file  Social History Narrative  . Not on file   Social Determinants of Health   Financial Resource Strain:   . Difficulty of Paying Living Expenses: Not on file  Food Insecurity:   . Worried About Charity fundraiser in the Last Year: Not on file  . Ran Out of Food in the Last Year: Not on file  Transportation Needs:   . Lack of Transportation (Medical): Not on file  . Lack of Transportation (Non-Medical): Not on file  Physical Activity:   . Days of Exercise per Week: Not on file  . Minutes of Exercise per Session: Not on file  Stress:   . Feeling of Stress : Not on file    Social Connections:   . Frequency of Communication with Friends and Family: Not on file  . Frequency of Social Gatherings with Friends and Family: Not on file  . Attends Religious Services: Not on file  . Active Member of Clubs or Organizations: Not on file  . Attends Archivist Meetings: Not on file  . Marital Status: Not on file  Intimate Partner Violence:   . Fear of Current or Ex-Partner: Not on file  . Emotionally Abused: Not on file  . Physically Abused: Not on file  . Sexually Abused: Not on file    Review of Systems   Objective:   Vitals:   05/23/20 1604  BP: 107/72  Pulse: 81  Resp: 15  Temp: 98.4 F (36.9 C)  TempSrc: Temporal  SpO2: 97%  Weight: 208 lb 6.4 oz (94.5 kg)  Height: 5' 3.5" (1.613 m)     Physical Exam Vitals reviewed.  Constitutional:      General: She is not in acute distress.    Appearance: She is well-developed.  HENT:     Head: Normocephalic and atraumatic.  Cardiovascular:     Rate and Rhythm: Normal rate.  Pulmonary:     Effort: Pulmonary effort is normal.  Musculoskeletal:     Comments: No focal bony tenderness of foot, full range of motion, minimal discomfort over the flexor tendons on the dorsal foot, plantar fascia nontender.  No erythema, no edema.  Pain-free range of motion of foot.  Skin:    Comments: Scalp, missing patches of hair.  Some dry scaling areas with some slight scale in front of her ears bilaterally.  Left greater than right.  No significant erythema or open wounds seen  Neurological:     Mental Status: She is alert and oriented to person, place, and time.      32 minutes spent during visit, greater than 50% counseling and assimilation of information, chart review, and discussion of plan.    Assessment & Plan:  Dana Lindsey is a 34 y.o. female . Seborrheic dermatitis of scalp - Plan: ketoconazole (NIZORAL) 2 % shampoo Hair loss  -Hair loss may be related to situational stress, but also thought  to have component of seborrheic dermatitis.  Trial of ketoconazole shampoo, twice per week initially then once per week for maintenance.  Option of dermatology eval.  Situational stress  -Overall appears to be managing, but handout given on other management techniques.  Left foot pain - Plan: Ambulatory referral to Podiatry  -Dorsal tendinitis/tendinosis possible, overall reassuring exam.  Podiatry eval.  Meds ordered this encounter  Medications  . ketoconazole (NIZORAL) 2 % shampoo    Sig: Apply 1 application topically 2 (two) times a week. Ist 2-4 weeks,  then once per week.    Dispense:  120 mL    Refill:  2   Patient Instructions   See info on stress below. Can try antifungal shampoo to treat for seborrhea as that can cause dandruff and scale.  If continued hair loss, I do recommend follow up with dermatology. Let me know and I can refer.   I will refer you to foot specialist. Continue stretches and range of motion of your foot few times per day.   Medical Weight Loss Management   [phone]   Managing Stress, Adult Feeling a certain amount of stress is normal. Stress helps our body and mind get ready to deal with the demands of life. Stress hormones can motivate you to do well at work and meet your responsibilities. However severe or long-lasting (chronic) stress can affect your mental and physical health. Chronic stress puts you at higher risk for anxiety, depression, and other health problems like digestive problems, muscle aches, heart disease, high blood pressure, and stroke. What are the causes? Common causes of stress include:  Demands from work, such as deadlines, feeling overworked, or having long hours.  Pressures at home, such as money issues, disagreements with a spouse, or parenting issues.  Pressures from major life changes, such as divorce, moving, loss of a loved one, or chronic illness. You may be at higher risk for stress-related problems if you do not get  enough sleep, are in poor health, do not have emotional support, or have a mental health disorder like anxiety or depression. How to recognize stress Stress can make you:  Have trouble sleeping.  Feel sad, anxious, irritable, or overwhelmed.  Lose your appetite.  Overeat or want to eat unhealthy foods.  Want to use drugs or alcohol. Stress can also cause physical symptoms, such as:  Sore, tense muscles, especially in the shoulders and neck.  Headaches.  Trouble breathing.  A faster heart rate.  Stomach pain, nausea, or vomiting.  Diarrhea or constipation.  Trouble concentrating. Follow these instructions at home: Lifestyle  Identify the source of your stress and your reaction to it. See a therapist who can help you change your reactions.  When there are stressful events: ? Talk about it with family, friends, or co-workers. ? Try to think realistically about stressful events and not ignore them or overreact. ? Try to find the positives in a stressful situation and not focus on the negatives. ? Cut back on responsibilities at work and home, if possible. Ask for help from friends or family members if you need it.  Find ways to cope with stress, such as: ? Meditation. ? Deep breathing. ? Yoga or tai chi. ? Progressive muscle relaxation. ? Doing art, playing music, or reading. ? Making time for fun activities. ? Spending time with family and friends.  Get support from family, friends, or spiritual resources. Eating and drinking  Eat a healthy diet. This includes: ? Eating foods that are high in fiber, such as beans, whole grains, and fresh fruits and vegetables. ? Limiting foods that are high in fat and processed sugars, such as fried and sweet foods.  Do not skip meals or overeat.  Drink enough fluid to keep your urine pale yellow. Alcohol use  Do not drink alcohol if: ? Your health care provider tells you not to drink. ? You are pregnant, may be pregnant, or  are planning to become pregnant.  Drinking alcohol is a way some people try to ease their stress. This  can be dangerous, so if you drink alcohol: ? Limit how much you use to:  0-1 drink a day for women.  0-2 drinks a day for men. ? Be aware of how much alcohol is in your drink. In the [place] one drink equals one 12 oz bottle of beer (355 mL), one 5 oz glass of wine (148 mL), or one 1 oz glass of hard liquor (44 mL). Activity   Include 30 minutes of exercise in your daily schedule. Exercise is a good stress reducer.  Include time in your day for an activity that you find relaxing. Try taking a walk, going on a bike ride, reading a book, or listening to music.  Schedule your time in a way that lowers stress, and keep a consistent schedule. Prioritize what is most important to get done. General instructions  Get enough sleep. Try to go to sleep and get up at about the same time every day.  Take over-the-counter and prescription medicines only as told by your health care provider.  Do not use any products that contain nicotine or tobacco, such as cigarettes, e-cigarettes, and chewing tobacco. If you need help quitting, ask your health care provider.  Do not use drugs or smoke to cope with stress.  Keep all follow-up visits as told by your health care provider. This is important. Where to find support  Talk with your health care provider about stress management or finding a support group.  Find a therapist to work with you on your stress management techniques. Contact a health care provider if:  Your stress symptoms get worse.  You are unable to manage your stress at home.  You are struggling to stop using drugs or alcohol. Get help right away if:  You may be a danger to yourself or others.  You have any thoughts of death or suicide. If you ever feel like you may hurt yourself or others, or have thoughts about taking your own life, get help right away. You can go to your  nearest emergency department or call:  Your local emergency services (911 in the [place]  A suicide crisis helpline, such as the [facility] at 548-085-2718. This is open 24 hours a day. Summary  Feeling a certain amount of stress is normal, but severe or long-lasting (chronic) stress can affect your mental and physical health.  Chronic stress can put you at higher risk for anxiety, depression, and other health problems like digestive problems, muscle aches, heart disease, high blood pressure, and stroke.  You may be at higher risk for stress-related problems if you do not get enough sleep, are in poor health, lack emotional support, or have a mental health disorder like anxiety or depression.  Identify the source of your stress and your reaction to it. Try talking about stressful events with family, friends, or co-workers, finding a coping method, or getting support from spiritual resources.  If you need more help, talk with your health care provider about finding a support group or a mental health therapist. This information is not intended to replace advice given to you by your health care provider. Make sure you discuss any questions you have with your health care provider. Document Revised: 03/23/2019 Document Reviewed: 03/23/2019 Elsevier Patient Education  [employer].   If you have lab work done today you will be contacted with your lab results within the next 2 weeks.  If you have not heard from [place] then please contact us. The fastest  way to get your results is to register for My Chart.   IF you received an x-ray today, you will receive an invoice from [facility]. Please contact [facility] at [phone] with questions or concerns regarding your invoice.   IF you received labwork today, you will receive an invoice from [facility]. Please contact [facility] at [phone] with questions or concerns regarding your invoice.   Our  billing staff will not be able to assist you with questions regarding bills from these companies.  You will be contacted with the lab results as soon as they are available. The fastest way to get your results is to activate your My Chart account. Instructions are located on the last page of this paperwork. If you have not heard from [place] regarding the results in 2 weeks, please contact this office.         Signed, Merri Ray, MD [facility]

## 2020-05-31 ENCOUNTER — Encounter: Payer: Self-pay | Admitting: Registered Nurse

## 2020-05-31 NOTE — Progress Notes (Signed)
Acute Office Visit  Subjective:    Patient ID: Dana Lindsey, female    DOB: 02/10/1986, 34 y.o.   MRN: [id number]  Chief Complaint  Patient presents with  . Cough    Patient states she was having tingling/dryness in her throat last week then states on the weekend she has a cough and throat is getting worse. Per patient when she get a     HPI Patient is in today for cough Started one week ago with tingling in throat Now has had some throat pain, dysphagia, globus sensation Feeling some heaviness and wheezing in lungs.  Symptoms overall getting worse No known exposure to COVID, no sick contacts Cough is still not productive No upper respiratory symptoms, gi symptoms, cv symptoms, sensory changes, or other concerns.   Past Medical History:  Diagnosis Date  . Asthma    Phreesia 03/06/2020  . Cholecystolithiasis   . Ear infection   . Gastritis   . Shortness of breath    with exertion    Past Surgical History:  Procedure Laterality Date  . CHOLECYSTECTOMY  08/23/2012   Procedure: LAPAROSCOPIC CHOLECYSTECTOMY WITH INTRAOPERATIVE CHOLANGIOGRAM;  Surgeon: Edward Jolly, MD;  Location: [facility] OR;  Service: General;  Laterality: N/A;    Family History  Problem Relation Age of Onset  . Alcoholism Father   . Asthma Daughter   . Asthma Son     Social History   Socioeconomic History  . Marital status: Married    Spouse name: Not on file  . Number of children: Not on file  . Years of education: Not on file  . Highest education level: Not on file  Occupational History  . Not on file  Tobacco Use  . Smoking status: Never Smoker  . Smokeless tobacco: Never Used  Vaping Use  . Vaping Use: Never used  Substance and Sexual Activity  . Alcohol use: No  . Drug use: No  . Sexual activity: Yes  Other Topics Concern  . Not on file  Social History Narrative  . Not on file   Social Determinants of Health   Financial Resource Strain:   . Difficulty of Paying Living  Expenses: Not on file  Food Insecurity:   . Worried About Charity fundraiser in the Last Year: Not on file  . Ran Out of Food in the Last Year: Not on file  Transportation Needs:   . Lack of Transportation (Medical): Not on file  . Lack of Transportation (Non-Medical): Not on file  Physical Activity:   . Days of Exercise per Week: Not on file  . Minutes of Exercise per Session: Not on file  Stress:   . Feeling of Stress : Not on file  Social Connections:   . Frequency of Communication with Friends and Family: Not on file  . Frequency of Social Gatherings with Friends and Family: Not on file  . Attends Religious Services: Not on file  . Active Member of Clubs or Organizations: Not on file  . Attends Archivist Meetings: Not on file  . Marital Status: Not on file  Intimate Partner Violence:   . Fear of Current or Ex-Partner: Not on file  . Emotionally Abused: Not on file  . Physically Abused: Not on file  . Sexually Abused: Not on file    Outpatient Medications Prior to Visit  Medication Sig Dispense Refill  . metroNIDAZOLE (FLAGYL) 500 MG tablet Take 1 tablet (500 mg total) by mouth 2 (two)  times daily. (Patient not taking: Reported on 04/23/2020) 14 tablet 0  . Prenatal Vit-Fe Fumarate-FA (PRENATAL VITAMIN PO) Take 1 tablet by mouth daily.  (Patient not taking: Reported on 04/23/2020)     No facility-administered medications prior to visit.    No Known Allergies  Review of Systems Per hpi      Objective:    Physical Exam Vitals and nursing note reviewed.  Constitutional:      General: She is not in acute distress.    Appearance: Normal appearance. She is not ill-appearing, toxic-appearing or diaphoretic.  Cardiovascular:     Rate and Rhythm: Normal rate and regular rhythm.     Heart sounds: Normal heart sounds.  Pulmonary:     Effort: Pulmonary effort is normal. No respiratory distress.     Breath sounds: Wheezing present. No rhonchi or rales.  Chest:      Chest wall: No tenderness.  Neurological:     General: No focal deficit present.     Mental Status: She is alert and oriented to person, place, and time. Mental status is at baseline.  Psychiatric:        Mood and Affect: Mood normal.        Behavior: Behavior normal.        Thought Content: Thought content normal.        Judgment: Judgment normal.     LMP 02/28/2020  Wt Readings from Last 3 Encounters:  05/23/20 208 lb 6.4 oz (94.5 kg)  04/23/20 215 lb 9.6 oz (97.8 kg)  03/09/20 214 lb 9.6 oz (97.3 kg)    Health Maintenance Due  Topic Date Due  . PAP SMEAR-Modifier  12/18/2019    There are no preventive care reminders to display for this patient.   Lab Results  Component Value Date   TSH 1.070 04/23/2020   Lab Results  Component Value Date   WBC 8.8 12/05/2018   HGB 11.9 (L) 12/05/2018   HCT 35.1 (L) 12/05/2018   MCV 87.5 12/05/2018   PLT 191 12/05/2018   Lab Results  Component Value Date   NA 142 04/23/2020   K 4.0 04/23/2020   CO2 20 04/23/2020   GLUCOSE 86 04/23/2020   BUN 15 04/23/2020   CREATININE 0.58 04/23/2020   BILITOT 0.4 04/23/2020   ALKPHOS 65 04/23/2020   AST 15 04/23/2020   ALT 15 04/23/2020   PROT 7.7 04/23/2020   ALBUMIN 4.6 04/23/2020   CALCIUM 9.7 04/23/2020   Lab Results  Component Value Date   CHOL 146 07/03/2008   Lab Results  Component Value Date   HDL 48 07/03/2008   Lab Results  Component Value Date   LDLCALC 73 07/03/2008   Lab Results  Component Value Date   TRIG 127 07/03/2008   Lab Results  Component Value Date   CHOLHDL 3.0 Ratio 07/03/2008   Lab Results  Component Value Date   HGBA1C 5.2 04/23/2020       Assessment & Plan:   Problem List Items Addressed This Visit    None    Visit Diagnoses    Wheezing    -  Primary   Relevant Medications   montelukast (SINGULAIR) 10 MG tablet   Lower respiratory infection       Relevant Medications   montelukast (SINGULAIR) 10 MG tablet       Meds ordered  this encounter  Medications  . DISCONTD: azithromycin (ZITHROMAX) 250 MG tablet    Sig: Take 2 tabs on first day.  Then take 1 tab daily. Finish entire supply.    Dispense:  6 tablet    Refill:  0    Order Specific Question:   Supervising Provider    Answer:   Arial, Lilia Argue [[id number]]  . montelukast (SINGULAIR) 10 MG tablet    Sig: Take 1 tablet (10 mg total) by mouth at bedtime.    Dispense:  30 tablet    Refill:  3    Order Specific Question:   Supervising Provider    AnswerPamella Pert, Lilia Argue [[id number]]  . DISCONTD: albuterol (VENTOLIN HFA) 108 (90 Base) MCG/ACT inhaler    Sig: Inhale 2 puffs into the lungs every 6 (six) hours as needed for wheezing or shortness of breath.    Dispense:  18 g    Refill:  3    Order Specific Question:   Supervising Provider    Answer:   Rutherford Guys [[id number]]   PLAN  z pack   singulair 10mg  PO qhs  Albuterol 1 puff q6h PRN  If symptoms worsen suggest COVID test before return to clinic  Patient encouraged to call clinic with any questions, comments, or concerns.   Maximiano Coss, NP

## 2020-07-20 ENCOUNTER — Other Ambulatory Visit: Payer: Self-pay | Admitting: Registered Nurse

## 2020-07-20 DIAGNOSIS — J22 Unspecified acute lower respiratory infection: Secondary | ICD-10-CM

## 2020-07-20 DIAGNOSIS — R062 Wheezing: Secondary | ICD-10-CM

## 2020-11-05 ENCOUNTER — Other Ambulatory Visit: Payer: Self-pay

## 2020-11-05 ENCOUNTER — Encounter (HOSPITAL_COMMUNITY): Payer: Self-pay | Admitting: Emergency Medicine

## 2020-11-05 ENCOUNTER — Ambulatory Visit (INDEPENDENT_AMBULATORY_CARE_PROVIDER_SITE_OTHER): Payer: Medicaid Other

## 2020-11-05 ENCOUNTER — Ambulatory Visit (HOSPITAL_COMMUNITY)
Admission: EM | Admit: 2020-11-05 | Discharge: 2020-11-05 | Disposition: A | Discharge status: Home / Self Care | Payer: Medicaid Other | Attending: Emergency Medicine | Admitting: Emergency Medicine

## 2020-11-05 DIAGNOSIS — M79645 Pain in left finger(s): Secondary | ICD-10-CM | POA: Diagnosis not present

## 2020-11-05 DIAGNOSIS — S63263A Dislocation of metacarpophalangeal joint of left middle finger, initial encounter: Secondary | ICD-10-CM | POA: Diagnosis not present

## 2020-11-05 NOTE — ED Triage Notes (Signed)
Pt presents with left middle finger pain and swelling.   States first injured finger about a month ago lifting something heavy. States has hit it multiple times since and after hitting it this weekend, was unable to move finger after.

## 2020-11-05 NOTE — Discharge Instructions (Signed)
Can take 600 mg of ibuprofen every 6 hours as needed for pain  Can take 650 mg of tylenol every 6 hours as needed for pain   Make appointment with hand specialist for evaluation of finger

## 2020-11-05 NOTE — ED Provider Notes (Cosign Needed Addendum)
[place]    CSN: [id number] Arrival date & time: 11/05/20  0809      History   Chief Complaint Chief Complaint  Patient presents with  . Finger Injury    HPI Dana Lindsey is a 35 y.o. female.   Patient presents with left middle finger pain 3/10 starting one month ago after lifting sofa. Swelling and decreased ROM starting 1 week ago after hitting finger twice while taking care of children. Denies numbness, tingling or radiating pain. No prior injury.   Past Medical History:  Diagnosis Date  . Asthma    Phreesia 03/06/2020  . Cholecystolithiasis   . Ear infection   . Gastritis   . Shortness of breath    with exertion    There are no problems to display for this patient.   Past Surgical History:  Procedure Laterality Date  . CHOLECYSTECTOMY  08/23/2012   Procedure: LAPAROSCOPIC CHOLECYSTECTOMY WITH INTRAOPERATIVE CHOLANGIOGRAM;  Surgeon: Edward Jolly, MD;  Location: [facility] OR;  Service: General;  Laterality: N/A;    OB History    Gravida  5   Para  4   Term  4   Preterm      AB  1   Living  4     SAB  1   IAB      Ectopic      Multiple  0   Live Births  4            Home Medications    Prior to Admission medications   Medication Sig Start Date End Date Taking? Authorizing Provider  albuterol (PROAIR HFA) 108 (90 Base) MCG/ACT inhaler Inhale 2 puffs into the lungs every 6 (six) hours as needed for wheezing or shortness of breath. 03/18/20   Maximiano Coss, NP  fluticasone (FLONASE) 50 MCG/ACT nasal spray Place 2 sprays into both nostrils daily. 04/23/20   Wendie Agreste, MD  ketoconazole (NIZORAL) 2 % shampoo Apply 1 application topically 2 (two) times a week. Ist 2-4 weeks, then once per week. 05/24/20   Wendie Agreste, MD  montelukast (SINGULAIR) 10 MG tablet TAKE 1 TABLET BY MOUTH EVERYDAY AT BEDTIME 07/20/20   Wendie Agreste, MD    Family History Family History  Problem Relation Age of Onset  . Alcoholism  Father   . Asthma Daughter   . Asthma Son     Social History Social History   Tobacco Use  . Smoking status: Never Smoker  . Smokeless tobacco: Never Used  Vaping Use  . Vaping Use: Never used  Substance Use Topics  . Alcohol use: No  . Drug use: No     Allergies   Patient has no known allergies.   Review of Systems Review of Systems  Constitutional: Negative.   Respiratory: Negative.   Cardiovascular: Negative.   Musculoskeletal: Negative.   Skin: Negative.   Neurological: Negative.      Physical Exam Triage Vital Signs ED Triage Vitals  Enc Vitals Group     BP 11/05/20 0826 135/86     Pulse Rate 11/05/20 0826 73     Resp 11/05/20 0826 18     Temp 11/05/20 0826 98.3 F (36.8 C)     Temp Source 11/05/20 0826 Oral     SpO2 11/05/20 0826 97 %     Weight --      Height --      Head Circumference --      Peak Flow --  Pain Score 11/05/20 0822 3     Pain Loc --      Pain Edu? --      Excl. in [place]? --    No data found.  Updated Vital Signs BP 135/86 (BP Location: Left Arm)   Pulse 73   Temp 98.3 F (36.8 C) (Oral)   Resp 18   LMP 10/31/2020   SpO2 97%   Visual Acuity Right Eye Distance:   Left Eye Distance:   Bilateral Distance:    Right Eye Near:   Left Eye Near:    Bilateral Near:     Physical Exam Constitutional:      Appearance: Normal appearance. She is normal weight.  HENT:     Head: Normocephalic.  Eyes:     Extraocular Movements: Extraocular movements intact.  Pulmonary:     Effort: Pulmonary effort is normal.  Musculoskeletal:     Right hand: Normal.     Left hand: No deformity, lacerations or bony tenderness. Normal sensation. Normal pulse.       Arms:     Comments: Swelling present over entire left middle finger, tenderness over metacarpophalangeal joint, unable to flex finger, extension intact   Skin:    General: Skin is warm and dry.  Neurological:     General: No focal deficit present.     Mental Status: She is  alert and oriented to person, place, and time. Mental status is at baseline.  Psychiatric:        Mood and Affect: Mood normal.        Behavior: Behavior normal.        Thought Content: Thought content normal.        Judgment: Judgment normal.      UC Treatments / Results  Labs (all labs ordered are listed, but only abnormal results are displayed) Labs Reviewed - No data to display  EKG   Radiology No results found.  Procedures Procedures (including critical care time)  Medications Ordered in UC Medications - No data to display  Initial Impression / Assessment and Plan / UC Course  I have reviewed the triage vital signs and the nursing notes.  Pertinent labs & imaging results that were available during my care of the patient were reviewed by me and considered in my medical decision making (see chart for details).  Closed dislocation of MCP of left middle finger      1. Xray of left middle finger- nondisplaced fracture of MCP joint 2. otc ibuprofen 600 mg every 4-6 hours as needed, otc tylenol 650 mg every 6 hours as needed for pain  3. Hand specialist referral completed 4.  Finger splint placed by nursing, vascular and neurologically intact prior to placement and after Final Clinical Impressions(s) / UC Diagnoses   Final diagnoses:  None   Discharge Instructions   None    ED Prescriptions    None     PDMP not reviewed this encounter.   Hans Eden, NP 11/05/20 0915    Hans Eden, NP 11/05/20 1253

## 2020-11-21 ENCOUNTER — Encounter: Payer: Medicaid Other | Admitting: Family Medicine

## 2020-12-07 ENCOUNTER — Ambulatory Visit: Payer: Medicaid Other | Admitting: Family Medicine

## 2020-12-07 ENCOUNTER — Other Ambulatory Visit: Payer: Self-pay

## 2020-12-07 VITALS — BP 121/74 | HR 58 | Wt 190.0 lb

## 2020-12-07 DIAGNOSIS — N83201 Unspecified ovarian cyst, right side: Secondary | ICD-10-CM | POA: Diagnosis not present

## 2020-12-07 DIAGNOSIS — R102 Pelvic and perineal pain: Secondary | ICD-10-CM | POA: Diagnosis not present

## 2020-12-07 DIAGNOSIS — Z30431 Encounter for routine checking of intrauterine contraceptive device: Secondary | ICD-10-CM | POA: Diagnosis not present

## 2020-12-07 MED ORDER — MEDROXYPROGESTERONE ACETATE 10 MG PO TABS: 10.0000 mg | ORAL_TABLET | Freq: Every day | ORAL | 3 refills | Status: DC | PRN

## 2020-12-07 NOTE — Progress Notes (Signed)
Patient complaining of pain that comes and goes. Patient states she had imaging in 12/2019. Patient also complaining of longer length of period/spotting (approx two weeks) Kathrene Alu RN   2022 periods: March 25- 30, 2022 Feb 23- March 8th,2022 Jan 17-Jan22, 2022 Dec13-20, 2021

## 2020-12-07 NOTE — Progress Notes (Signed)
   Subjective:    Patient ID: Dana Lindsey, female    DOB: [birth year], 35 y.o.   MRN: [id number]  HPI Patient seen for follow up of pelvic pain and irregular bleeding. Having breakthrough bleeding with IUD and has prolonged bleeding - up to 2 weeks. Pain is irregular - sharp pain in pelvis that are nonradiating. Pain more on the right side than left. No abnormal discharge.   Review of Systems     Objective:   Physical Exam Vitals and nursing note reviewed. Exam conducted with a chaperone present.  Constitutional:      Appearance: Normal appearance.  Cardiovascular:     Rate and Rhythm: Normal rate and regular rhythm.     Pulses: Normal pulses.  Pulmonary:     Effort: Pulmonary effort is normal.  Abdominal:     General: Abdomen is flat.     Palpations: Abdomen is soft.     Hernia: There is no hernia in the left inguinal area or right inguinal area.  Genitourinary:    Labia:        Right: No rash, tenderness or lesion.        Left: No rash, tenderness or lesion.      Vagina: No signs of injury and foreign body. No vaginal discharge, erythema, tenderness or bleeding.     Cervix: No cervical motion tenderness, discharge, friability, lesion or erythema.     Uterus: Normal. Not deviated, not enlarged, not fixed and not tender.      Adnexa:        Right: Tenderness present. No mass.         Left: No mass or tenderness.       Lymphadenopathy:     Lower Body: No right inguinal adenopathy. No left inguinal adenopathy.  Skin:    Capillary Refill: Capillary refill takes less than 2 seconds.  Neurological:     General: No focal deficit present.     Mental Status: She is alert.  Psychiatric:        Mood and Affect: Mood normal.        Behavior: Behavior normal.        Thought Content: Thought content normal.        Judgment: Judgment normal.         Assessment & Plan:  1. IUD check up IUD in place. Can use OCPs or progesterone to help with control of breakthrough  bleeding  2. Pelvic pain in female I question whether this is due to the IUD. Patient really hesitant about removing the IUD. May be able to control with additional hormones. Will follow up US. - US PELVIC COMPLETE WITH TRANSVAGINAL; Future  3. Right ovarian cyst  - US PELVIC COMPLETE WITH TRANSVAGINAL; Future

## 2020-12-11 ENCOUNTER — Ambulatory Visit (HOSPITAL_BASED_OUTPATIENT_CLINIC_OR_DEPARTMENT_OTHER)
Admission: RE | Admit: 2020-12-11 | Discharge: 2020-12-11 | Disposition: A | Discharge status: Home / Self Care | Payer: Medicaid Other | Source: Ambulatory Visit | Attending: Family Medicine | Admitting: Family Medicine

## 2020-12-11 ENCOUNTER — Other Ambulatory Visit: Payer: Self-pay

## 2020-12-11 DIAGNOSIS — N83201 Unspecified ovarian cyst, right side: Secondary | ICD-10-CM | POA: Diagnosis present

## 2020-12-11 DIAGNOSIS — R102 Pelvic and perineal pain: Secondary | ICD-10-CM | POA: Diagnosis present

## 2021-01-24 ENCOUNTER — Encounter (HOSPITAL_COMMUNITY): Payer: Self-pay

## 2021-01-24 ENCOUNTER — Ambulatory Visit (HOSPITAL_COMMUNITY)
Admission: EM | Admit: 2021-01-24 | Discharge: 2021-01-24 | Disposition: A | Discharge status: Home / Self Care | Payer: Medicaid Other | Attending: Internal Medicine | Admitting: Internal Medicine

## 2021-01-24 DIAGNOSIS — B9789 Other viral agents as the cause of diseases classified elsewhere: Secondary | ICD-10-CM | POA: Diagnosis not present

## 2021-01-24 DIAGNOSIS — J988 Other specified respiratory disorders: Secondary | ICD-10-CM

## 2021-01-24 MED ORDER — BENZONATATE 100 MG PO CAPS: 100.0000 mg | ORAL_CAPSULE | Freq: Three times a day (TID) | ORAL | 0 refills | Status: DC

## 2021-01-24 MED ORDER — ALBUTEROL SULFATE HFA 108 (90 BASE) MCG/ACT IN AERS: 2.0000 | INHALATION_SPRAY | Freq: Four times a day (QID) | RESPIRATORY_TRACT | 0 refills | Status: DC | PRN

## 2021-01-24 MED ORDER — ONDANSETRON 4 MG PO TBDP: 4.0000 mg | ORAL_TABLET | Freq: Three times a day (TID) | ORAL | 0 refills | Status: DC | PRN

## 2021-01-24 NOTE — Discharge Instructions (Addendum)
Increase oral fluid intake Alternate Tylenol with Motrin as needed Take medications as prescribed If you have worsening symptoms please return to urgent care to be reevaluated.

## 2021-01-24 NOTE — ED Triage Notes (Signed)
Pt reports cough, bilateral ear pain  and fatigue x 1 week, chills, headache, and vomiting x 1 day. OTC cough meds give some relief.   Pt reports she being in the hospital with her son, who was admitted for Human metapneumovirus (HMPV).

## 2021-01-26 NOTE — ED Provider Notes (Signed)
[place]    CSN: [id number] Arrival date & time: 01/24/21  1911      History   Chief Complaint Chief Complaint  Patient presents with  . Cough  . Fatigue    HPI Dana Lindsey is a 35 y.o. female comes to the urgent care with 1 week history of bilateral ear pain, cough, headaches, fatigue, fever and chills.  Patient's child was recently diagnosed and treated for human metapneumovirus infection.  Since then every family member has been having similar symptoms.  Patient denies any diarrhea.  She is able to keep food and fluids down.  She has tried ibuprofen and Tylenol for generalized body aches.  Cough is nonproductive.  She denies any shortness of breath but has some chest tightness and some end expiratory wheezing when she has bouts of coughing. HPI  Past Medical History:  Diagnosis Date  . Asthma    Phreesia 03/06/2020  . Cholecystolithiasis   . Ear infection   . Gastritis   . Shortness of breath    with exertion    There are no problems to display for this patient.   Past Surgical History:  Procedure Laterality Date  . CHOLECYSTECTOMY  08/23/2012   Procedure: LAPAROSCOPIC CHOLECYSTECTOMY WITH INTRAOPERATIVE CHOLANGIOGRAM;  Surgeon: Edward Jolly, MD;  Location: [facility] OR;  Service: General;  Laterality: N/A;    OB History    Gravida  5   Para  4   Term  4   Preterm      AB  1   Living  4     SAB  1   IAB      Ectopic      Multiple  0   Live Births  4            Home Medications    Prior to Admission medications   Medication Sig Start Date End Date Taking? Authorizing Provider  albuterol (VENTOLIN HFA) 108 (90 Base) MCG/ACT inhaler Inhale 2 puffs into the lungs every 6 (six) hours as needed for wheezing or shortness of breath. 01/24/21  Yes Tashanda Fuhrer, Myrene Galas, MD  benzonatate (TESSALON) 100 MG capsule Take 1 capsule (100 mg total) by mouth every 8 (eight) hours. 01/24/21  Yes Rudra Hobbins, Myrene Galas, MD  Multiple Vitamin  (MULTIVITAMIN) capsule Take 1 capsule by mouth daily.   Yes [provider]  ondansetron (ZOFRAN ODT) 4 MG disintegrating tablet Take 1 tablet (4 mg total) by mouth every 8 (eight) hours as needed for nausea or vomiting. 01/24/21  Yes Angelino Rumery, Myrene Galas, MD  fluticasone (FLONASE) 50 MCG/ACT nasal spray Place 2 sprays into both nostrils daily. 04/23/20 01/24/21  Wendie Agreste, MD  medroxyPROGESTERone (PROVERA) 10 MG tablet Take 1 tablet (10 mg total) by mouth daily as needed (bleeding/spotting). Use for ten days 12/07/20 01/24/21  Truett Mainland, DO  montelukast (SINGULAIR) 10 MG tablet TAKE 1 TABLET BY MOUTH EVERYDAY AT BEDTIME Patient not taking: No sig reported 07/20/20 01/24/21  Wendie Agreste, MD    Family History Family History  Problem Relation Age of Onset  . Alcoholism Father   . Asthma Daughter   . Asthma Son     Social History Social History   Tobacco Use  . Smoking status: Never Smoker  . Smokeless tobacco: Never Used  Vaping Use  . Vaping Use: Never used  Substance Use Topics  . Alcohol use: No  . Drug use: No     Allergies   Patient  has no known allergies.   Review of Systems Review of Systems  Constitutional: Positive for chills and fever.  HENT: Positive for congestion, ear pain and sore throat. Negative for postnasal drip.   Respiratory: Positive for cough and wheezing. Negative for shortness of breath.   Cardiovascular: Positive for chest pain.  Gastrointestinal: Positive for nausea and vomiting. Negative for abdominal pain and diarrhea.  Musculoskeletal: Positive for myalgias.  Neurological: Positive for dizziness and headaches.     Physical Exam Triage Vital Signs ED Triage Vitals  Enc Vitals Group     BP 01/24/21 1945 135/77     Pulse Rate 01/24/21 1945 68     Resp 01/24/21 1945 18     Temp 01/24/21 1945 98.7 F (37.1 C)     Temp Source 01/24/21 1945 Oral     SpO2 01/24/21 1945 99 %     Weight --      Height --      Head  Circumference --      Peak Flow --      Pain Score 01/24/21 1940 0     Pain Loc --      Pain Edu? --      Excl. in [place]? --    No data found.  Updated Vital Signs BP 135/77 (BP Location: Right Arm)   Pulse 68   Temp 98.7 F (37.1 C) (Oral)   Resp 18   SpO2 99%   Breastfeeding No   Visual Acuity Right Eye Distance:   Left Eye Distance:   Bilateral Distance:    Right Eye Near:   Left Eye Near:    Bilateral Near:     Physical Exam Vitals and nursing note reviewed.  Constitutional:      Appearance: She is ill-appearing.  HENT:     Right Ear: Tympanic membrane normal.     Left Ear: Tympanic membrane normal.     Mouth/Throat:     Mouth: Mucous membranes are moist.     Pharynx: No oropharyngeal exudate or posterior oropharyngeal erythema.  Eyes:     Pupils: Pupils are equal, round, and reactive to light.  Cardiovascular:     Rate and Rhythm: Normal rate and regular rhythm.     Pulses: Normal pulses.     Heart sounds: Normal heart sounds.  Pulmonary:     Effort: Pulmonary effort is normal. No respiratory distress.     Breath sounds: Normal breath sounds. No wheezing or rhonchi.  Abdominal:     General: Bowel sounds are normal.  Musculoskeletal:        General: Normal range of motion.  Neurological:     Mental Status: She is alert.      UC Treatments / Results  Labs (all labs ordered are listed, but only abnormal results are displayed) Labs Reviewed - No data to display  EKG   Radiology No results found.  Procedures Procedures (including critical care time)  Medications Ordered in UC Medications - No data to display  Initial Impression / Assessment and Plan / UC Course  I have reviewed the triage vital signs and the nursing notes.  Pertinent labs & imaging results that were available during my care of the patient were reviewed by me and considered in my medical decision making (see chart for details).     1.  Acute viral respiratory illness likely  secondary to human metapneumovirus: Patient is advised to increase oral fluids Tylenol/Motrin as needed for fever and/or body aches Tessalon Perles  as needed for cough Zofran as needed for nausea/vomiting Albuterol inhaler as needed for wheezing and/or chest tightness Return to urgent care if symptoms worsen. No indication for antibiotics Final Clinical Impressions(s) / UC Diagnoses   Final diagnoses:  Viral respiratory illness     Discharge Instructions     Increase oral fluid intake Alternate Tylenol with Motrin as needed Take medications as prescribed If you have worsening symptoms please return to urgent care to be reevaluated.   ED Prescriptions    Medication Sig Dispense Auth. Provider   benzonatate (TESSALON) 100 MG capsule Take 1 capsule (100 mg total) by mouth every 8 (eight) hours. 30 capsule Wilfrido Luedke, Myrene Galas, MD   ondansetron (ZOFRAN ODT) 4 MG disintegrating tablet Take 1 tablet (4 mg total) by mouth every 8 (eight) hours as needed for nausea or vomiting. 20 tablet Sativa Gelles, Myrene Galas, MD   albuterol (VENTOLIN HFA) 108 (90 Base) MCG/ACT inhaler Inhale 2 puffs into the lungs every 6 (six) hours as needed for wheezing or shortness of breath. 18 g Whitfield Dulay, Myrene Galas, MD     PDMP not reviewed this encounter.   Chase Picket, MD 01/26/21 [id number]

## 2021-01-31 ENCOUNTER — Other Ambulatory Visit: Payer: Self-pay

## 2021-01-31 ENCOUNTER — Emergency Department (HOSPITAL_COMMUNITY): Payer: Medicaid Other

## 2021-01-31 ENCOUNTER — Encounter (HOSPITAL_COMMUNITY): Payer: Self-pay | Admitting: *Deleted

## 2021-01-31 ENCOUNTER — Emergency Department (HOSPITAL_COMMUNITY)
Admission: EM | Admit: 2021-01-31 | Discharge: 2021-01-31 | Disposition: A | Discharge status: Home / Self Care | Payer: Medicaid Other | Attending: Emergency Medicine | Admitting: Emergency Medicine

## 2021-01-31 DIAGNOSIS — R042 Hemoptysis: Secondary | ICD-10-CM | POA: Insufficient documentation

## 2021-01-31 DIAGNOSIS — Z20822 Contact with and (suspected) exposure to covid-19: Secondary | ICD-10-CM | POA: Diagnosis not present

## 2021-01-31 DIAGNOSIS — J069 Acute upper respiratory infection, unspecified: Secondary | ICD-10-CM | POA: Diagnosis not present

## 2021-01-31 DIAGNOSIS — R059 Cough, unspecified: Secondary | ICD-10-CM

## 2021-01-31 DIAGNOSIS — J45909 Unspecified asthma, uncomplicated: Secondary | ICD-10-CM | POA: Diagnosis not present

## 2021-01-31 LAB — BASIC METABOLIC PANEL
Anion gap: 8 (ref 5–15)
BUN: 12 mg/dL (ref 6–20)
CO2: 28 mmol/L (ref 22–32)
Calcium: 9.2 mg/dL (ref 8.9–10.3)
Chloride: 103 mmol/L (ref 98–111)
Creatinine, Ser: 0.57 mg/dL (ref 0.44–1.00)
GFR, Estimated: >60 mL/min (ref >60)
Glucose, Bld: 93 mg/dL (ref 70–99)
Potassium: 3.3 mmol/L — ABNORMAL LOW (ref 3.5–5.1)
Sodium: 139 mmol/L (ref 135–145)

## 2021-01-31 LAB — RESPIRATORY PANEL BY PCR

## 2021-01-31 LAB — CBC WITH DIFFERENTIAL/PLATELET
Abs Immature Granulocytes: 0.02 10*3/uL (ref 0.00–0.07)
Basophils Absolute: 0.1 10*3/uL (ref 0.0–0.1)
Basophils Relative: 1 %
Eosinophils Absolute: 0.1 10*3/uL (ref 0.0–0.5)
Eosinophils Relative: 2 %
HCT: 39.1 % (ref 36.0–46.0)
Hemoglobin: 13 g/dL (ref 12.0–15.0)
Immature Granulocytes: 0 %
Lymphocytes Relative: 29 %
Lymphs Abs: 2.4 10*3/uL (ref 0.7–4.0)
MCH: 29 pg (ref 26.0–34.0)
MCHC: 33.2 g/dL (ref 30.0–36.0)
MCV: 87.3 fL (ref 80.0–100.0)
Monocytes Absolute: 0.4 10*3/uL (ref 0.1–1.0)
Monocytes Relative: 5 %
Neutro Abs: 5.3 10*3/uL (ref 1.7–7.7)
Neutrophils Relative %: 63 %
Platelets: 295 10*3/uL (ref 150–400)
RBC: 4.48 MIL/uL (ref 3.87–5.11)
RDW: 11.9 % (ref 11.5–15.5)
WBC: 8.3 10*3/uL (ref 4.0–10.5)
nRBC: 0 % (ref 0.0–0.2)

## 2021-01-31 LAB — RESP PANEL BY RT-PCR (FLU A&B, COVID) ARPGX2
Influenza A by PCR: NEGATIVE
Influenza B by PCR: NEGATIVE
SARS Coronavirus 2 by RT PCR: NEGATIVE

## 2021-01-31 LAB — I-STAT BETA HCG BLOOD, ED (MC, WL, AP ONLY): I-stat hCG, quantitative: <5 m[IU]/mL (ref <5)

## 2021-01-31 MED ORDER — FLUTICASONE PROPIONATE 50 MCG/ACT NA SUSP: 1.0000 | Freq: Every day | NASAL | 2 refills | Status: DC

## 2021-01-31 MED ORDER — KETOROLAC TROMETHAMINE 30 MG/ML IJ SOLN: 30.0000 mg | Freq: Once | INTRAMUSCULAR | Status: DC

## 2021-01-31 MED ORDER — IOHEXOL 350 MG/ML SOLN
50.0000 mL | Freq: Once | INTRAVENOUS | Status: AC | PRN
  Administered 2021-01-31: 50 mL via INTRAVENOUS

## 2021-01-31 NOTE — ED Provider Notes (Signed)
Emergency Medicine Provider Triage Evaluation Note  Dana Lindsey , a 35 y.o. female  was evaluated in triage.  Pt complains of cough and uri sxs for several weeks. She states she has had several episodes of hemoptysis but also had blood coming for her nose. States her son was recently hospitalized for human metapneumovirus.   Review of Systems  Positive: cough Negative: fever  Physical Exam  BP (!) 136/103   Pulse 70   Temp 99.1 F (37.3 C) (Oral)   Resp 16   SpO2 100%  Gen:   Awake, no distress   Resp:  Normal effort  MSK:   Moves extremities without difficulty  Other:  Lungs ctab  Medical Decision Making  Medically screening exam initiated at 3:10 PM.  Appropriate orders placed.  Dana Lindsey was informed that the remainder of the evaluation will be completed by another provider, this initial triage assessment does not replace that evaluation, and the importance of remaining in the ED until their evaluation is complete.    Bishop Dublin 01/31/21 1514    Carmin Muskrat, MD 01/31/21 1538

## 2021-01-31 NOTE — ED Triage Notes (Signed)
Pt reports her son hospitalized for a virus, she has had cough and resp symptoms for several weeks but now coughing up blood. Had episode 3 days ago of sharp pain to left side of her neck that radiated down into her left arm and shoulder. No acute distress is noted at triage.

## 2021-01-31 NOTE — ED Provider Notes (Signed)
[facility] EMERGENCY DEPARTMENT Provider Note   CSN: [id number] Arrival date & time: 01/31/21  1450     History Chief Complaint  Patient presents with  . Cough  . Chest Pain  . Hemoptysis    Dana Lindsey is a 35 y.o. female.  Dana Lindsey presents to the ED today with cough and hemoptysis.  Dana Lindsey's son developed sob and cough on 5/10.  He was hospitalized for several days and was diagnosed with human metapneumoniavirus.  She said he had a fever for 11 days.  She has not had a fever, but has had continued cough and is now coughing up blood.  She did go to UC on 5/19 and was given a rx for zofran, tessalon perles, and albuterol.  She did not get them filled because she thought they would make her too tired and she has 5 kids.          Past Medical History:  Diagnosis Date  . Asthma    Phreesia 03/06/2020  . Cholecystolithiasis   . Ear infection   . Gastritis   . Shortness of breath    with exertion    There are no problems to display for this patient.   Past Surgical History:  Procedure Laterality Date  . CHOLECYSTECTOMY  08/23/2012   Procedure: LAPAROSCOPIC CHOLECYSTECTOMY WITH INTRAOPERATIVE CHOLANGIOGRAM;  Surgeon: Edward Jolly, MD;  Location: [facility] OR;  Service: General;  Laterality: N/A;     OB History    Gravida  5   Para  4   Term  4   Preterm      AB  1   Living  4     SAB  1   IAB      Ectopic      Multiple  0   Live Births  4           Family History  Problem Relation Age of Onset  . Alcoholism Father   . Asthma Daughter   . Asthma Son     Social History   Tobacco Use  . Smoking status: Never Smoker  . Smokeless tobacco: Never Used  Vaping Use  . Vaping Use: Never used  Substance Use Topics  . Alcohol use: No  . Drug use: No    Home Medications Prior to Admission medications   Medication Sig Start Date End Date Taking? Authorizing Provider  albuterol (VENTOLIN HFA) 108 (90 Base) MCG/ACT inhaler  Inhale 2 puffs into the lungs every 6 (six) hours as needed for wheezing or shortness of breath. 01/24/21  Yes Lamptey, Myrene Galas, MD  fluticasone (FLONASE) 50 MCG/ACT nasal spray Place 1 spray into both nostrils daily. 01/31/21  Yes Isla Pence, MD  medroxyPROGESTERone (PROVERA) 10 MG tablet Take 10 mg by mouth as needed (BLEEDING/SPOTTING).   Yes [provider]  Multiple Vitamins-Minerals (HAIR/SKIN/NAILS/BIOTIN PO) Take 2 tablets by mouth daily.   Yes [provider]  Multiple Vitamins-Minerals (MULTIVITAMIN ADULT) CHEW Chew 2 tablets by mouth daily.   Yes [provider]  benzonatate (TESSALON) 100 MG capsule Take 1 capsule (100 mg total) by mouth every 8 (eight) hours. Patient not taking: Reported on 01/31/2021 01/24/21   Chase Picket, MD  ondansetron (ZOFRAN ODT) 4 MG disintegrating tablet Take 1 tablet (4 mg total) by mouth every 8 (eight) hours as needed for nausea or vomiting. Patient not taking: Reported on 01/31/2021 01/24/21   Chase Picket, MD  montelukast (SINGULAIR) 10 MG tablet TAKE  1 TABLET BY MOUTH EVERYDAY AT BEDTIME Patient not taking: No sig reported 07/20/20 01/24/21  Wendie Agreste, MD    Allergies    Patient has no known allergies.  Review of Systems   Review of Systems  Respiratory: Positive for cough and shortness of breath.   All other systems reviewed and are negative.   Physical Exam Updated Vital Signs BP (!) 134/96   Pulse 73   Temp 97.9 F (36.6 C) (Oral)   Resp (!) 28   SpO2 100%   Physical Exam Vitals and nursing note reviewed.  Constitutional:      Appearance: She is well-developed.  HENT:     Head: Normocephalic and atraumatic.  Eyes:     Extraocular Movements: Extraocular movements intact.     Pupils: Pupils are equal, round, and reactive to light.  Cardiovascular:     Rate and Rhythm: Normal rate and regular rhythm.     Heart sounds: Normal heart sounds.  Pulmonary:     Effort: Pulmonary effort is  normal.     Breath sounds: Normal breath sounds.  Abdominal:     General: Bowel sounds are normal.     Palpations: Abdomen is soft.  Musculoskeletal:        General: Normal range of motion.     Cervical back: Normal range of motion and neck supple.  Skin:    General: Skin is warm.     Capillary Refill: Capillary refill takes less than 2 seconds.  Neurological:     General: No focal deficit present.     Mental Status: She is alert and oriented to person, place, and time.  Psychiatric:        Mood and Affect: Mood normal.        Behavior: Behavior normal.     ED Results / Procedures / Treatments   Labs (all labs ordered are listed, but only abnormal results are displayed) Labs Reviewed  BASIC METABOLIC PANEL - Abnormal; Notable for the following components:      Result Value   Potassium 3.3 (*)    All other components within normal limits  RESPIRATORY PANEL BY PCR  RESP PANEL BY RT-PCR (FLU A&B, COVID) ARPGX2  CBC WITH DIFFERENTIAL/PLATELET  I-STAT BETA HCG BLOOD, ED ([facility], [facility], [facility] ONLY)    EKG None  Radiology DG Chest 2 View  Result Date: 01/31/2021 CLINICAL DATA:  Cough for 2 weeks EXAM: CHEST - 2 VIEW COMPARISON:  None. FINDINGS: The heart size and mediastinal contours are within normal limits. Both lungs are clear. The visualized skeletal structures are unremarkable. Symmetrical nodular opacities at the bases likely correspond to nipple shadows. IMPRESSION: No active cardiopulmonary disease. Electronically Signed   By: Donavan Foil M.D.   On: 01/31/2021 16:29   CT Angio Chest PE W and/or Wo Contrast  Result Date: 01/31/2021 CLINICAL DATA:  Hemoptysis.  High probability pulmonary embolus. EXAM: CT ANGIOGRAPHY CHEST WITH CONTRAST TECHNIQUE: Multidetector CT imaging of the chest was performed using the standard protocol during bolus administration of intravenous contrast. Multiplanar CT image reconstructions and MIPs were obtained to evaluate the vascular anatomy. CONTRAST:   59mL OMNIPAQUE IOHEXOL 350 MG/ML SOLN COMPARISON:  None. FINDINGS: Cardiovascular: The heart size is normal. No substantial pericardial effusion. There is no filling defect within the opacified pulmonary arteries to suggest the presence of an acute pulmonary embolus. Mediastinum/Nodes: No mediastinal lymphadenopathy. There is no hilar lymphadenopathy. The esophagus has normal imaging features. There is no axillary lymphadenopathy. Lungs/Pleura: No suspicious  pulmonary nodule or mass. No focal airspace consolidation. There is no evidence of pleural effusion. Upper Abdomen: Unremarkable. Musculoskeletal: No worrisome lytic or sclerotic osseous abnormality. Review of the MIP images confirms the above findings. IMPRESSION: 1. No CT evidence for acute pulmonary embolus. 2. No other acute findings in the thorax. Electronically Signed   By: Misty Stanley M.D.   On: 01/31/2021 17:41    Procedures Procedures   Medications Ordered in ED Medications  ketorolac (TORADOL) 30 MG/ML injection 30 mg (0 mg Intravenous Hold 01/31/21 1641)  iohexol (OMNIPAQUE) 350 MG/ML injection 50 mL (50 mLs Intravenous Contrast Given 01/31/21 1722)    ED Course  I have reviewed the triage vital signs and the nursing notes.  Pertinent labs & imaging results that were available during my care of the patient were reviewed by me and considered in my medical decision making (see chart for details).    MDM Rules/Calculators/A&P                          Dana Lindsey likely also has human metapneumonia virus.  The CTA does not show any evidence of PE or PNA.  She is likely coughing up blood from irritation to her airways from the virus.  Dana Lindsey is not hypoxic and looks non toxic.  She is stable for d/c.  Return if worse.  F/u with pcp.  Final Clinical Impression(s) / ED Diagnoses Final diagnoses:  Hemoptysis  Viral upper respiratory tract infection    Rx / DC Orders ED Discharge Orders         Ordered    fluticasone (FLONASE) 50 MCG/ACT  nasal spray  Daily        01/31/21 1759           Isla Pence, MD 01/31/21 1759

## 2021-02-25 ENCOUNTER — Ambulatory Visit: Payer: Medicaid Other | Admitting: Family Medicine

## 2021-02-28 ENCOUNTER — Ambulatory Visit (INDEPENDENT_AMBULATORY_CARE_PROVIDER_SITE_OTHER): Payer: Medicaid Other | Admitting: Family Medicine

## 2021-02-28 ENCOUNTER — Encounter: Payer: Self-pay | Admitting: Family Medicine

## 2021-02-28 ENCOUNTER — Other Ambulatory Visit: Payer: Self-pay

## 2021-02-28 VITALS — BP 132/72 | HR 87 | Temp 98.4°F | Resp 16 | Ht 63.5 in | Wt 195.0 lb

## 2021-02-28 DIAGNOSIS — R042 Hemoptysis: Secondary | ICD-10-CM

## 2021-02-28 DIAGNOSIS — J22 Unspecified acute lower respiratory infection: Secondary | ICD-10-CM | POA: Diagnosis not present

## 2021-02-28 DIAGNOSIS — R062 Wheezing: Secondary | ICD-10-CM

## 2021-02-28 DIAGNOSIS — J4520 Mild intermittent asthma, uncomplicated: Secondary | ICD-10-CM

## 2021-02-28 MED ORDER — MONTELUKAST SODIUM 10 MG PO TABS: ORAL_TABLET | ORAL | 3 refills | Status: DC

## 2021-02-28 MED ORDER — FLUTICASONE PROPIONATE HFA 44 MCG/ACT IN AERO: 1.0000 | INHALATION_SPRAY | Freq: Two times a day (BID) | RESPIRATORY_TRACT | 6 refills | Status: DC | PRN

## 2021-02-28 NOTE — Patient Instructions (Signed)
If persistent cough or frequent need for albuterol - start flovent inhaler twice per day. Restart singulair.

## 2021-02-28 NOTE — Progress Notes (Signed)
Subjective:  Patient ID: Dana Lindsey, female    DOB: 02/06/1986  Age: 35 y.o. MRN: [id number]  CC:  Chief Complaint  Patient presents with   Follow-up    Pt following up from ED coughing up blood, notes went to ED did CT scan, has been taking cetrizine and nasal spray improved symptoms, pt denies blood after that visit, feels fatigued at the end of the day, requests refill singular     HPI Dana Lindsey presents for   Hemoptysis: Urgent care visit May 19 with viral syndrome, cough.  History of asthma.  Treated with albuterol, Zofran, Tessalon.  ER visit noted from May 26.  Her son developed the shortness of breath and cough in May, hospitalized and diagnosed with human metapneumovirus.  At ER visit noted no fever but continued cough and some blood-tinged sputum.  She did not fill the medications from previous visit due to concerns regarding fatigue and ability to care for her children.  Respiratory panel by PCR was pan negative.  Including metapneumovirus, CT chest without evidence of acute PE, no acute findings in the thorax.  Chest x-ray no active cardiopulmonary disease.  CBC was normal.  BMP borderline low potassium at 3.3.  Blood was thought to be from irritation from cough.  Not hypoxic, nontoxic, discharged for symptomatic care with Flonase nasal spray.   Cough has improved, rare cough now. No further hemoptysis. Only cough with increased activity at times, or sometimes at night - tickle sensation. Rare wheezing - none in past week, and no need for albuterol in past week (was using BID) but rare use prior - only with respiratory infections.  Taking zyrtec and flonase. Ran out of singulair.  Some fatigue at end of day, but better.   History There are no problems to display for this patient.  Past Medical History:  Diagnosis Date   Asthma    Phreesia 03/06/2020   Cholecystolithiasis    Ear infection    Gastritis    Shortness of breath    with exertion   Past Surgical  History:  Procedure Laterality Date   CHOLECYSTECTOMY  08/23/2012   Procedure: LAPAROSCOPIC CHOLECYSTECTOMY WITH INTRAOPERATIVE CHOLANGIOGRAM;  Surgeon: Edward Jolly, MD;  Location: [facility] OR;  Service: General;  Laterality: N/A;   No Known Allergies Prior to Admission medications   Medication Sig Start Date End Date Taking? Authorizing Provider  albuterol (VENTOLIN HFA) 108 (90 Base) MCG/ACT inhaler Inhale 2 puffs into the lungs every 6 (six) hours as needed for wheezing or shortness of breath. 01/24/21   Chase Picket, MD  fluticasone (FLONASE) 50 MCG/ACT nasal spray Place 1 spray into both nostrils daily. 01/31/21   Isla Pence, MD  medroxyPROGESTERone (PROVERA) 10 MG tablet Take 10 mg by mouth as needed (BLEEDING/SPOTTING).    [provider]  Multiple Vitamins-Minerals (HAIR/SKIN/NAILS/BIOTIN PO) Take 2 tablets by mouth daily.    [provider]  Multiple Vitamins-Minerals (MULTIVITAMIN ADULT) CHEW Chew 2 tablets by mouth daily.    [provider]  montelukast (SINGULAIR) 10 MG tablet TAKE 1 TABLET BY MOUTH EVERYDAY AT BEDTIME Patient not taking: No sig reported 07/20/20 01/24/21  Wendie Agreste, MD   Social History   Socioeconomic History   Marital status: Married    Spouse name: Not on file   Number of children: Not on file   Years of education: Not on file   Highest education level: Not on file  Occupational History   Not on  file  Tobacco Use   Smoking status: Never   Smokeless tobacco: Never  Vaping Use   Vaping Use: Never used  Substance and Sexual Activity   Alcohol use: No   Drug use: No   Sexual activity: Yes  Other Topics Concern   Not on file  Social History Narrative   Not on file   Social Determinants of Health   Financial Resource Strain: Not on file  Food Insecurity: Not on file  Transportation Needs: Not on file  Physical Activity: Not on file  Stress: Not on file  Social Connections: Not on file  Intimate  Partner Violence: Not on file    Review of Systems Per HPI.   Objective:   Vitals:   02/28/21 1205  BP: 132/72  Pulse: 87  Resp: 16  Temp: 98.4 F (36.9 C)  TempSrc: Temporal  SpO2: 97%  Weight: 195 lb (88.5 kg)  Height: 5' 3.5" (1.613 m)     Physical Exam Vitals reviewed.  Constitutional:      Appearance: Normal appearance. She is well-developed.  HENT:     Head: Normocephalic and atraumatic.  Eyes:     Conjunctiva/sclera: Conjunctivae normal.     Pupils: Pupils are equal, round, and reactive to light.  Neck:     Vascular: No carotid bruit.  Cardiovascular:     Rate and Rhythm: Normal rate and regular rhythm.     Heart sounds: Normal heart sounds.  Pulmonary:     Effort: Pulmonary effort is normal. No respiratory distress.     Breath sounds: Normal breath sounds. No stridor. No wheezing, rhonchi or rales.  Abdominal:     Palpations: Abdomen is soft. There is no pulsatile mass.     Tenderness: no abdominal tenderness  Musculoskeletal:     Right lower leg: No edema.     Left lower leg: No edema.  Skin:    General: Skin is warm and dry.  Neurological:     Mental Status: She is alert and oriented to person, place, and time.  Psychiatric:        Mood and Affect: Mood normal.        Behavior: Behavior normal.       Assessment & Plan:  Dana Lindsey is a 35 y.o. female . Mild intermittent asthma without complication  Wheezing - Plan: montelukast (SINGULAIR) 10 MG tablet  Lower respiratory infection - Plan: montelukast (SINGULAIR) 10 MG tablet  Hemoptysis  Hemoptysis resolved, likely mucosal irritation from frequent cough previously.  Likely component of flare of asthma at that time with her suspected viral respiratory illness.  Symptoms are improving as above.  Option of inhaled corticosteroid for additional asthma treatment if frequent need for albuterol or persistent cough.  Restarting Singulair may also be helpful.  Continue Flonase nasal spray and  Zyrtec for allergic colitis/upper airway symptoms.  RTC precautions given.  Borderline potassium noted from ER visit, potassium rich foods recommended.  RTC precautions given.  Meds ordered this encounter  Medications   montelukast (SINGULAIR) 10 MG tablet    Sig: TAKE 1 TABLET BY MOUTH EVERYDAY AT BEDTIME    Dispense:  90 tablet    Refill:  3   fluticasone (FLOVENT HFA) 44 MCG/ACT inhaler    Sig: Inhale 1 puff into the lungs 2 (two) times daily as needed. For asthma flair or increased cough.    Dispense:  1 each    Refill:  6   Patient Instructions  If persistent cough or  frequent need for albuterol - start flovent inhaler twice per day. Restart singulair.     Signed,   Merri Ray, MD [facility] [employer] 02/28/21 12:42 PM

## 2021-03-08 ENCOUNTER — Ambulatory Visit (INDEPENDENT_AMBULATORY_CARE_PROVIDER_SITE_OTHER): Payer: Medicaid Other | Admitting: Family Medicine

## 2021-03-08 ENCOUNTER — Encounter: Payer: Self-pay | Admitting: Family Medicine

## 2021-03-08 ENCOUNTER — Other Ambulatory Visit: Payer: Self-pay

## 2021-03-08 VITALS — BP 123/86 | HR 70 | Wt 196.0 lb

## 2021-03-08 DIAGNOSIS — N921 Excessive and frequent menstruation with irregular cycle: Secondary | ICD-10-CM

## 2021-03-08 DIAGNOSIS — Z975 Presence of (intrauterine) contraceptive device: Secondary | ICD-10-CM | POA: Diagnosis not present

## 2021-03-08 DIAGNOSIS — R102 Pelvic and perineal pain: Secondary | ICD-10-CM

## 2021-03-08 MED ORDER — MELOXICAM 7.5 MG PO TABS: 7.5000 mg | ORAL_TABLET | Freq: Two times a day (BID) | ORAL | 3 refills | Status: DC | PRN

## 2021-03-08 NOTE — Progress Notes (Signed)
   Subjective:    Patient ID: Dana Lindsey, female    DOB: Jun 19, 1986, 34 y.o.   MRN: [id number]  HPI Patient seen for follow up of pelvic pain and breakthrough bleeding from IUD. Still has occasional twinges of pain, usually when moving: standing up, bending over, rolling over in bed. No particular time of the month. Last [place] normal (01/2021). Breakthrough bleeding improved.   Review of Systems     Objective:   Physical Exam Vitals reviewed. Exam conducted with a chaperone present.  Cardiovascular:     Rate and Rhythm: Normal rate.  Abdominal:     General: Abdomen is flat.     Palpations: Abdomen is soft.  Neurological:     General: No focal deficit present.  Psychiatric:        Mood and Affect: Mood normal.        Behavior: Behavior normal.        Thought Content: Thought content normal.       Assessment & Plan:  1. Pelvic pain in female Will give mobic prn. Return in 3 months  2. Breakthrough bleeding with IUD Resolved.

## 2021-05-20 ENCOUNTER — Ambulatory Visit (HOSPITAL_COMMUNITY)
Admission: EM | Admit: 2021-05-20 | Discharge: 2021-05-20 | Disposition: A | Discharge status: Home / Self Care | Payer: Medicaid Other | Attending: Emergency Medicine | Admitting: Emergency Medicine

## 2021-05-20 ENCOUNTER — Encounter (HOSPITAL_COMMUNITY): Payer: Self-pay

## 2021-05-20 ENCOUNTER — Other Ambulatory Visit: Payer: Self-pay

## 2021-05-20 DIAGNOSIS — N912 Amenorrhea, unspecified: Secondary | ICD-10-CM

## 2021-05-20 DIAGNOSIS — R1030 Lower abdominal pain, unspecified: Secondary | ICD-10-CM

## 2021-05-20 LAB — POCT URINALYSIS DIPSTICK, ED / UC
Bilirubin Urine: NEGATIVE
Glucose, UA: NEGATIVE mg/dL
Ketones, ur: NEGATIVE mg/dL
Leukocytes,Ua: NEGATIVE
Nitrite: NEGATIVE
Protein, ur: NEGATIVE mg/dL
Specific Gravity, Urine: 1.02 (ref 1.005–1.030)
Urobilinogen, UA: 0.2 mg/dL (ref 0.0–1.0)
pH: 5.5 (ref 5.0–8.0)

## 2021-05-20 LAB — POC URINE PREG, ED: Preg Test, Ur: NEGATIVE

## 2021-05-20 NOTE — ED Triage Notes (Signed)
Pt presents with lower abdominal pain and missed period.

## 2021-05-20 NOTE — Discharge Instructions (Signed)
Your pregnancy test was negative.   Follow up with your OB/GYN for further evaluation of your missed period and abdominal pain.

## 2021-05-20 NOTE — ED Provider Notes (Signed)
[facility]    CSN: [id number] Arrival date & time: 05/20/21  1029      History   Chief Complaint Chief Complaint  Patient presents with   Abdominal Pain   Amenorrhea    HPI Dana Lindsey is a 35 y.o. female.   Patient here for evaluation of lower abdominal pain, bloating, and missed period.  LMP 03/29/21.  Patient reports having an IUD placed last May.  Reports periods have been irregular since.  Denies any nausea, vomiting, diarrhea, or constipation.  Denies any recent sick contacts.  Denies any trauma, injury, or other precipitating event.  Denies any specific alleviating or aggravating factors.  Denies any fevers, chest pain, shortness of breath, N/V/D, numbness, tingling, weakness, or headaches.    The history is provided by the patient.  Abdominal Pain Associated symptoms: no dysuria, no hematuria, no vaginal bleeding and no vaginal discharge    Past Medical History:  Diagnosis Date   Asthma    Phreesia 03/06/2020   Cholecystolithiasis    Ear infection    Gastritis    Shortness of breath    with exertion    There are no problems to display for this patient.   Past Surgical History:  Procedure Laterality Date   CHOLECYSTECTOMY  08/23/2012   Procedure: LAPAROSCOPIC CHOLECYSTECTOMY WITH INTRAOPERATIVE CHOLANGIOGRAM;  Surgeon: Edward Jolly, MD;  Location: [facility] OR;  Service: General;  Laterality: N/A;    OB History     Gravida  5   Para  4   Term  4   Preterm      AB  1   Living  4      SAB  1   IAB      Ectopic      Multiple  0   Live Births  4            Home Medications    Prior to Admission medications   Medication Sig Start Date End Date Taking? Authorizing Provider  albuterol (VENTOLIN HFA) 108 (90 Base) MCG/ACT inhaler Inhale 2 puffs into the lungs every 6 (six) hours as needed for wheezing or shortness of breath. 01/24/21   Chase Picket, MD  fluticasone (FLONASE) 50 MCG/ACT nasal spray Place 1 spray into  both nostrils daily. 01/31/21   Isla Pence, MD  fluticasone (FLOVENT HFA) 44 MCG/ACT inhaler Inhale 1 puff into the lungs 2 (two) times daily as needed. For asthma flair or increased cough. Patient not taking: Reported on 03/08/2021 02/28/21   Wendie Agreste, MD  meloxicam (MOBIC) 7.5 MG tablet Take 1 tablet (7.5 mg total) by mouth 2 (two) times daily as needed for pain. 03/08/21   Truett Mainland, DO  montelukast (SINGULAIR) 10 MG tablet TAKE 1 TABLET BY MOUTH EVERYDAY AT BEDTIME Patient not taking: Reported on 03/08/2021 02/28/21   Wendie Agreste, MD  Multiple Vitamins-Minerals (HAIR/SKIN/NAILS/BIOTIN PO) Take 2 tablets by mouth daily.    [provider]  Multiple Vitamins-Minerals (MULTIVITAMIN ADULT) CHEW Chew 2 tablets by mouth daily.    [provider]    Family History Family History  Problem Relation Age of Onset   Alcoholism Father    Asthma Daughter    Asthma Son     Social History Social History   Tobacco Use   Smoking status: Never   Smokeless tobacco: Never  Vaping Use   Vaping Use: Never used  Substance Use Topics   Alcohol use: No   Drug use:  No     Allergies   Patient has no known allergies.   Review of Systems Review of Systems  Gastrointestinal:  Positive for abdominal pain.  Genitourinary:  Positive for menstrual problem and pelvic pain. Negative for dysuria, flank pain, hematuria, urgency, vaginal bleeding, vaginal discharge and vaginal pain.  All other systems reviewed and are negative.   Physical Exam Triage Vital Signs ED Triage Vitals [05/20/21 1128]  Enc Vitals Group     BP (!) 138/91     Pulse Rate 60     Resp 18     Temp 98.5 F (36.9 C)     Temp Source Oral     SpO2 98 %     Weight      Height      Head Circumference      Peak Flow      Pain Score 6     Pain Loc      Pain Edu?      Excl. in [place]?    No data found.  Updated Vital Signs BP (!) 138/91 (BP Location: Left Arm)   Pulse 60   Temp 98.5 F (36.9  C) (Oral)   Resp 18   LMP 03/29/2021   SpO2 98%   Visual Acuity Right Eye Distance:   Left Eye Distance:   Bilateral Distance:    Right Eye Near:   Left Eye Near:    Bilateral Near:     Physical Exam Vitals and nursing note reviewed.  Constitutional:      General: She is not in acute distress.    Appearance: Normal appearance. She is not ill-appearing, toxic-appearing or diaphoretic.  HENT:     Head: Normocephalic and atraumatic.  Eyes:     Conjunctiva/sclera: Conjunctivae normal.  Cardiovascular:     Rate and Rhythm: Normal rate.     Pulses: Normal pulses.  Pulmonary:     Effort: Pulmonary effort is normal.  Abdominal:     General: Abdomen is flat. Bowel sounds are normal.     Palpations: Abdomen is soft.     Tenderness: There is abdominal tenderness in the suprapubic area. There is no right CVA tenderness or left CVA tenderness.     Hernia: No hernia is present.  Genitourinary:    Comments: declines Musculoskeletal:        General: Normal range of motion.     Cervical back: Normal range of motion.  Skin:    General: Skin is warm and dry.  Neurological:     General: No focal deficit present.     Mental Status: She is alert and oriented to person, place, and time.  Psychiatric:        Mood and Affect: Mood normal.     UC Treatments / Results  Labs (all labs ordered are listed, but only abnormal results are displayed) Labs Reviewed  POCT URINALYSIS DIPSTICK, ED / UC - Abnormal; Notable for the following components:      Result Value   Hgb urine dipstick LARGE (*)    All other components within normal limits  POC URINE PREG, ED    EKG   Radiology No results found.  Procedures Procedures (including critical care time)  Medications Ordered in UC Medications - No data to display  Initial Impression / Assessment and Plan / UC Course  I have reviewed the triage vital signs and the nursing notes.  Pertinent labs & imaging results that were available  during my care of the patient  were reviewed by me and considered in my medical decision making (see chart for details).    Assessment negative for red flags or concerns.  Urinalysis positive for hemoglobin but otherwise negative with no signs of infection.  Urine pregnancy negative.  Amenorrhea and lower abdominal pain.  Recommend following up with OB/GYN as soon as possible for reevaluation Final Clinical Impressions(s) / UC Diagnoses   Final diagnoses:  Amenorrhea  Lower abdominal pain     Discharge Instructions      Your pregnancy test was negative.   Follow up with your OB/GYN for further evaluation of your missed period and abdominal pain.     ED Prescriptions   None    PDMP not reviewed this encounter.   Pearson Forster, NP 05/20/21 1153

## 2021-06-12 ENCOUNTER — Encounter: Payer: Self-pay | Admitting: Family Medicine

## 2021-06-12 ENCOUNTER — Other Ambulatory Visit: Payer: Self-pay

## 2021-06-12 ENCOUNTER — Ambulatory Visit (INDEPENDENT_AMBULATORY_CARE_PROVIDER_SITE_OTHER): Payer: Medicaid Other | Admitting: Family Medicine

## 2021-06-12 VITALS — BP 113/67 | HR 81 | Wt 206.0 lb

## 2021-06-12 DIAGNOSIS — Z30431 Encounter for routine checking of intrauterine contraceptive device: Secondary | ICD-10-CM

## 2021-06-12 DIAGNOSIS — R1030 Lower abdominal pain, unspecified: Secondary | ICD-10-CM | POA: Diagnosis not present

## 2021-06-12 DIAGNOSIS — L659 Nonscarring hair loss, unspecified: Secondary | ICD-10-CM

## 2021-06-12 NOTE — Progress Notes (Signed)
Follow up from IUD. Amenorrhea and having abdominal pain.  Kathrene Alu RN

## 2021-06-12 NOTE — Progress Notes (Signed)
   Subjective:    Patient ID: Dana Lindsey, female    DOB: 02-20-1986, 35 y.o.   MRN: [id number]  HPI  Patient seen for abdominal pain - she has been having recurrent "pinching" abdominal pain, but had an episode of pain that was much worse than her normal. Pain in her lower abdomen, nonradiating. Associated with bloating. Symptoms worse with wearing tight clothing. Was seen at urgent care - had vaginal culture done, which was normal. Pregnancy test normal. Pain now resolved, except her normal pain.  Additionally, her hair is brittle and falling out some.   Review of Systems     Objective:   Physical Exam Vitals reviewed. Exam conducted with a chaperone present.  Constitutional:      Appearance: Normal appearance.  Cardiovascular:     Rate and Rhythm: Normal rate and regular rhythm.  Abdominal:     Hernia: There is no hernia in the left inguinal area or right inguinal area.  Genitourinary:    Labia:        Right: No rash, tenderness or lesion.        Left: No rash, tenderness or lesion.      Vagina: Normal.     Cervix: Normal.     Lymphadenopathy:     Lower Body: No right inguinal adenopathy. No left inguinal adenopathy.  Neurological:     Mental Status: She is alert.  Psychiatric:        Mood and Affect: Mood normal.        Thought Content: Thought content normal.        Judgment: Judgment normal.      Assessment & Plan:   1. Lower abdominal pain Possibly from IUD. Discussed removal - patient would still like to wait.  2. Hair loss Check labs - TSH - CBC  3. IUD check up In place

## 2021-06-13 LAB — CBC
Hematocrit: 38.3 % (ref 34.0–46.6)
Hemoglobin: 13.4 g/dL (ref 11.1–15.9)
MCH: 29.3 pg (ref 26.6–33.0)
MCHC: 35 g/dL (ref 31.5–35.7)
MCV: 84 fL (ref 79–97)
Platelets: 261 10*3/uL (ref 150–450)
RBC: 4.57 x10E6/uL (ref 3.77–5.28)
RDW: 11.3 % — ABNORMAL LOW (ref 11.7–15.4)
WBC: 8.1 10*3/uL (ref 3.4–10.8)

## 2021-06-13 LAB — TSH: TSH: 1.39 u[IU]/mL (ref 0.450–4.500)

## 2021-08-02 ENCOUNTER — Other Ambulatory Visit: Payer: Self-pay | Admitting: Family Medicine

## 2021-08-02 DIAGNOSIS — L219 Seborrheic dermatitis, unspecified: Secondary | ICD-10-CM

## 2021-08-22 ENCOUNTER — Encounter: Payer: Medicaid Other | Admitting: Family Medicine

## 2021-08-22 DIAGNOSIS — Z130 Encounter for screening for diseases of the blood and blood-forming organs and certain disorders involving the immune mechanism: Secondary | ICD-10-CM

## 2021-08-22 DIAGNOSIS — Z13220 Encounter for screening for lipoid disorders: Secondary | ICD-10-CM

## 2021-08-22 DIAGNOSIS — Z131 Encounter for screening for diabetes mellitus: Secondary | ICD-10-CM

## 2021-08-22 NOTE — Progress Notes (Deleted)
Subjective:  Patient ID: Dana Lindsey, female    DOB: 02/26/1986  Age: 35 y.o. MRN: [id number]  CC: No chief complaint on file.   HPI Dana Lindsey presents for   Annual physical exam.  GYN: DR. Nehemiah Settle  Mild intermittent asthma Last discussed in June.  Continued on Flonase nasal spray, Zyrtec for allergic rhinitis/upper airway symptoms, restarting Singulair, with option of Flovent inhaler.  Albuterol for breakthrough symptoms.  Currently taking  Depression screen [facility] 2/9 05/23/2020 04/23/2020 03/09/2020 03/06/2020 05/13/2016  Decreased Interest 0 0 0 0 0  Down, Depressed, Hopeless 0 0 0 0 0  PHQ - 2 Score 0 0 0 0 0   Cancer screening Pap testing with GYN.  Negative in April 2018.  HPV not detected.  Immunization History  Administered Date(s) Administered   Influenza,inj,Quad PF,6+ Mos 06/03/2017, 07/23/2018   PFIZER(Purple Top)SARS-COV-2 Vaccination 11/29/2019, 12/20/2019, 07/16/2020   Tdap 04/09/2017   No results found.  Dental:  Alcohol:  Tobacco:  Diet:  Exercise:    History There are no problems to display for this patient.  Past Medical History:  Diagnosis Date   Asthma    [employer] 03/06/2020   Cholecystolithiasis    Ear infection    Gastritis    Shortness of breath    with exertion   Past Surgical History:  Procedure Laterality Date   CHOLECYSTECTOMY  08/23/2012   Procedure: LAPAROSCOPIC CHOLECYSTECTOMY WITH INTRAOPERATIVE CHOLANGIOGRAM;  Surgeon: Edward Jolly, MD;  Location: [facility] OR;  Service: General;  Laterality: N/A;   No Known Allergies Prior to Admission medications   Medication Sig Start Date End Date Taking? Authorizing Provider  albuterol (VENTOLIN HFA) 108 (90 Base) MCG/ACT inhaler Inhale 2 puffs into the lungs every 6 (six) hours as needed for wheezing or shortness of breath. 01/24/21   Chase Picket, MD  fluticasone (FLONASE) 50 MCG/ACT nasal spray Place 1 spray into both nostrils daily. 01/31/21   Isla Pence, MD   fluticasone (FLOVENT HFA) 44 MCG/ACT inhaler Inhale 1 puff into the lungs 2 (two) times daily as needed. For asthma flair or increased cough. Patient not taking: Reported on 03/08/2021 02/28/21   Wendie Agreste, MD  ketoconazole (NIZORAL) 2 % shampoo APPLY 1 APPLICATION TOPICALLY 2 TIMES A WEEK. IST 2-4 WEEKS, THEN ONCE PER WEEK. 08/05/21   Wendie Agreste, MD  meloxicam (MOBIC) 7.5 MG tablet Take 1 tablet (7.5 mg total) by mouth 2 (two) times daily as needed for pain. 03/08/21   Truett Mainland, DO  montelukast (SINGULAIR) 10 MG tablet TAKE 1 TABLET BY MOUTH EVERYDAY AT BEDTIME Patient not taking: Reported on 03/08/2021 02/28/21   Wendie Agreste, MD  Multiple Vitamins-Minerals (HAIR/SKIN/NAILS/BIOTIN PO) Take 2 tablets by mouth daily.    [provider]  Multiple Vitamins-Minerals (MULTIVITAMIN ADULT) CHEW Chew 2 tablets by mouth daily.    [provider]   Social History   Socioeconomic History   Marital status: Married    Spouse name: Not on file   Number of children: Not on file   Years of education: Not on file   Highest education level: Not on file  Occupational History   Not on file  Tobacco Use   Smoking status: Never   Smokeless tobacco: Never  Vaping Use   Vaping Use: Never used  Substance and Sexual Activity   Alcohol use: No   Drug use: No   Sexual activity: Yes  Other Topics Concern   Not on file  Social History Narrative   Not on file   Social Determinants of Health   Financial Resource Strain: Not on file  Food Insecurity: Not on file  Transportation Needs: Not on file  Physical Activity: Not on file  Stress: Not on file  Social Connections: Not on file  Intimate Partner Violence: Not on file    Review of Systems   Objective:  There were no vitals filed for this visit.   Physical Exam     Assessment & Plan:  Dana Lindsey is a 35 y.o. female . Screening for cholesterol level  Screening for deficiency  anemia  Screening for diabetes mellitus   No orders of the defined types were placed in this encounter.  There are no Patient Instructions on file for this visit.    Signed,   Dana Ray, MD [place], [facility] 08/22/21 1:13 PM

## 2021-08-29 IMAGING — CR DG CHEST 2V
2 series · 2 of 2 positions shown · non-contrast
Comparison: None.

CLINICAL DATA: Cough for 2 weeks

EXAM:
CHEST - 2 VIEW

[chest pa]
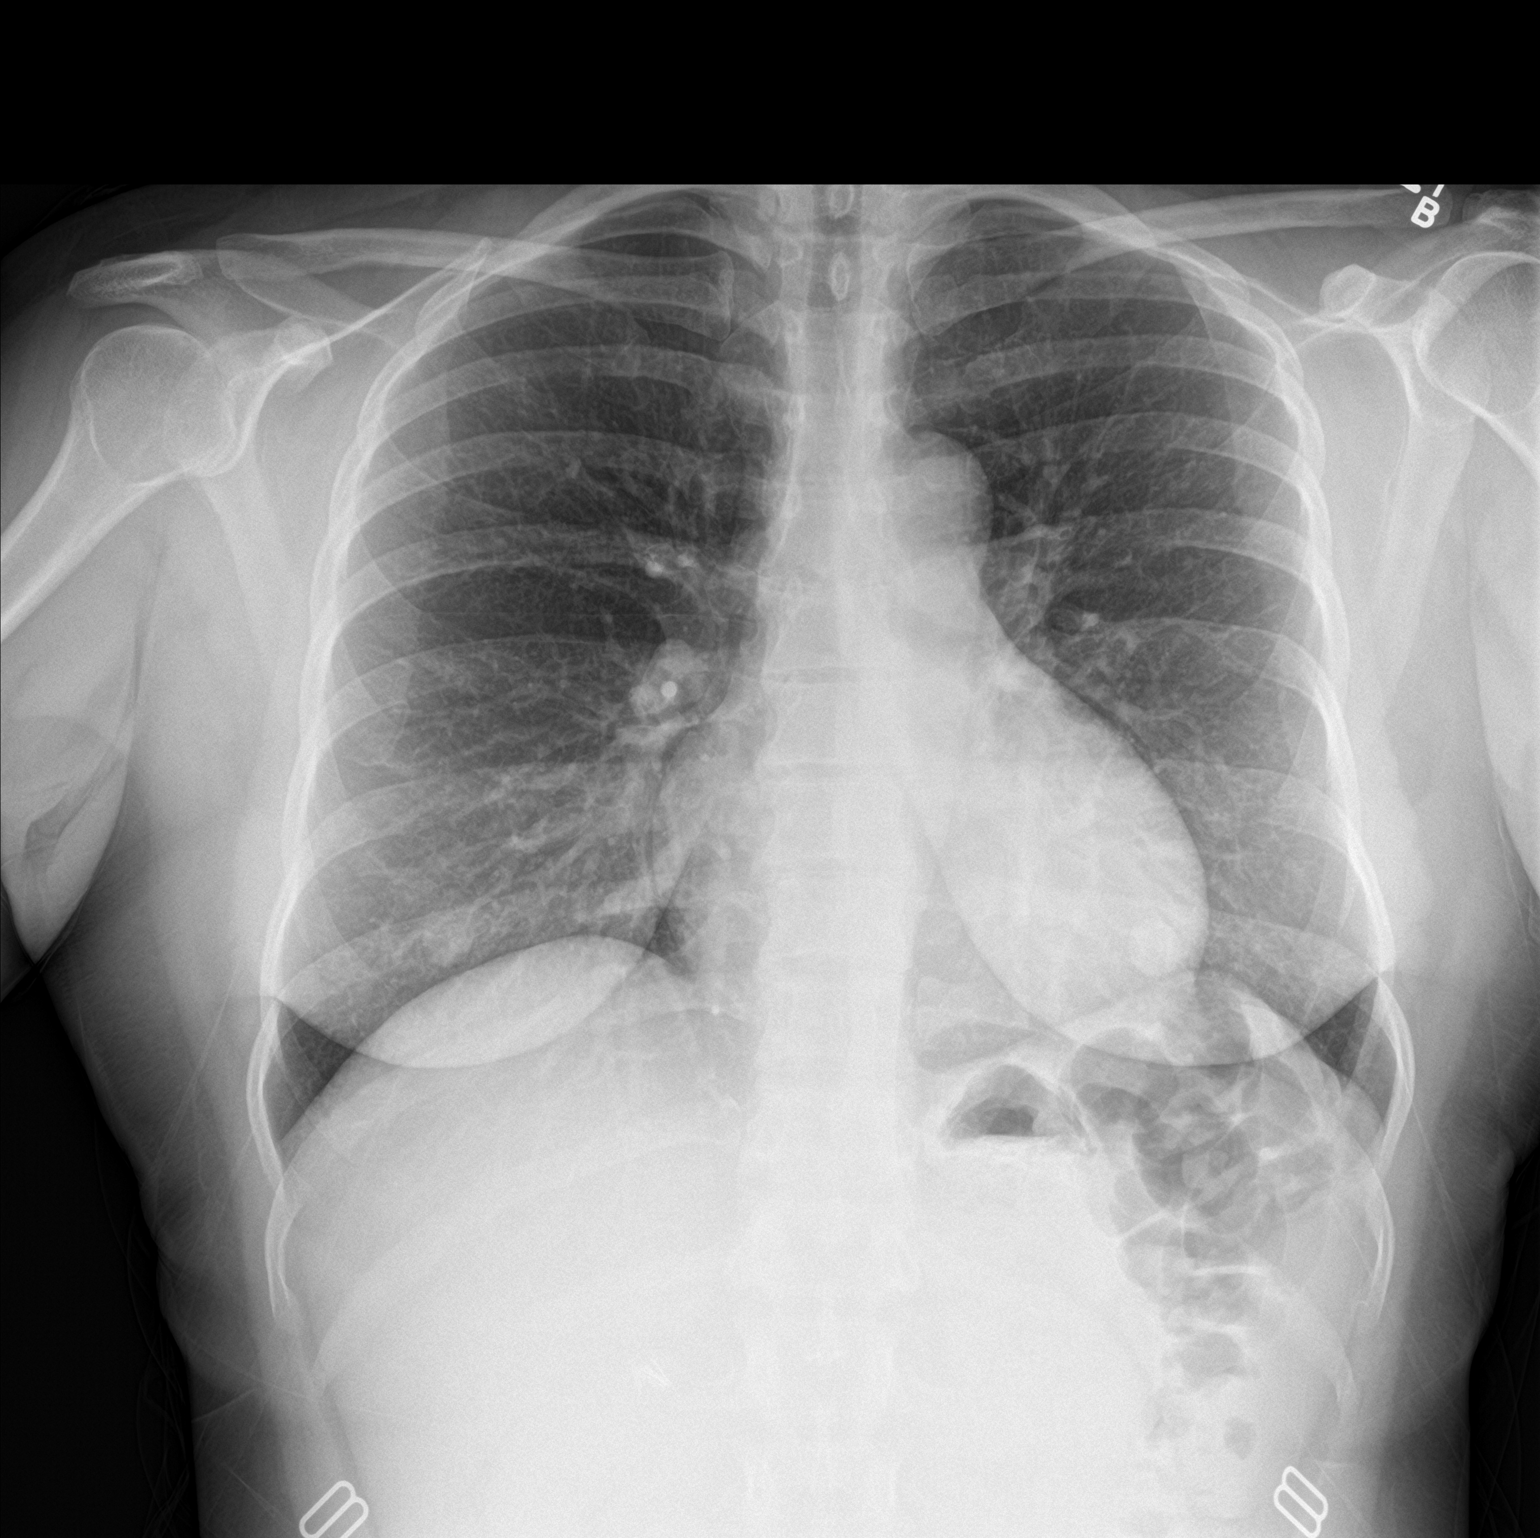

[chest lat]
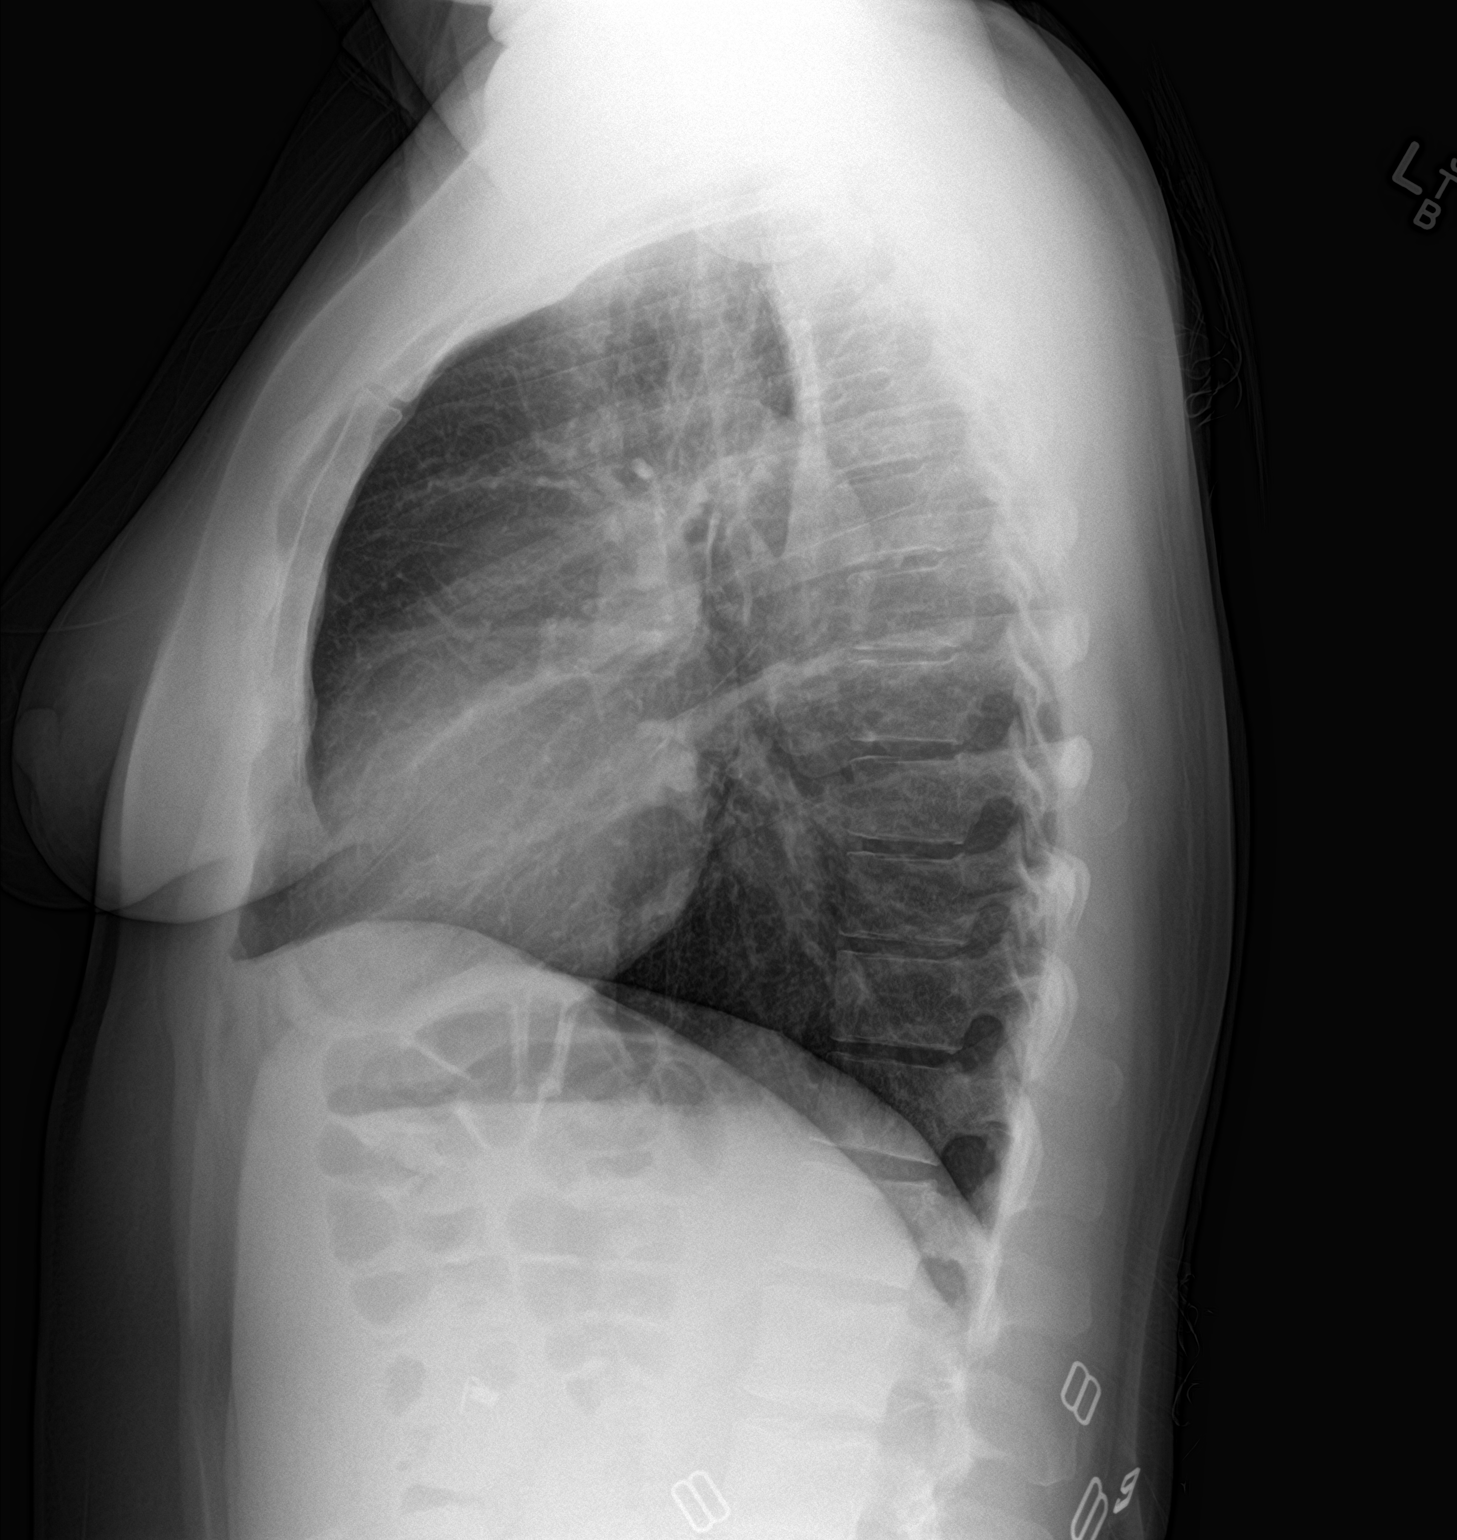

[2 of 2 positions shown; findings below may reference images not displayed]

FINDINGS: The heart size and mediastinal contours are within normal limits.
Both lungs are clear. The visualized skeletal structures are
unremarkable. Symmetrical nodular opacities at the bases likely
correspond to nipple shadows.
IMPRESSION: No active cardiopulmonary disease.

## 2021-08-29 IMAGING — CT CT ANGIO CHEST
2 of 6 series · 17 of 46 positions shown · IV contrast (omnipaque)
Comparison: None.

CLINICAL DATA: Hemoptysis.  High probability pulmonary embolus.

EXAM:
CT ANGIOGRAPHY CHEST WITH CONTRAST
TECHNIQUE: Multidetector CT imaging of the chest was performed using the
standard protocol during bolus administration of intravenous
contrast. Multiplanar CT image reconstructions and MIPs were
obtained to evaluate the vascular anatomy.
CONTRAST:  50mL OMNIPAQUE IOHEXOL 350 MG/ML SOLN

[Series 7: thins · axial · 0.72mm/px · z∈[-165,+82]mm · 14 of 386 slices shown]
[im 17/386  lung]
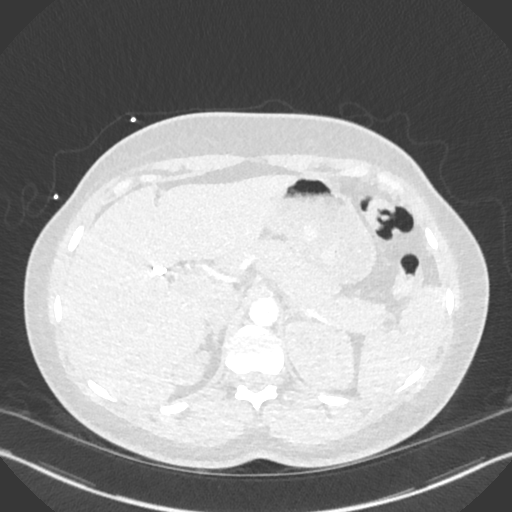
[im 51/386  soft-tissue]
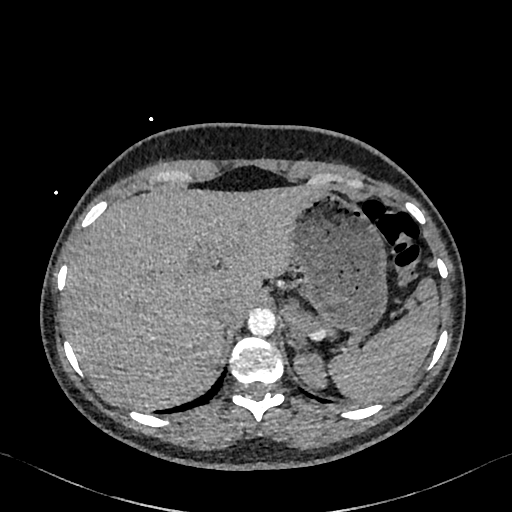
[im 67/386  lung]
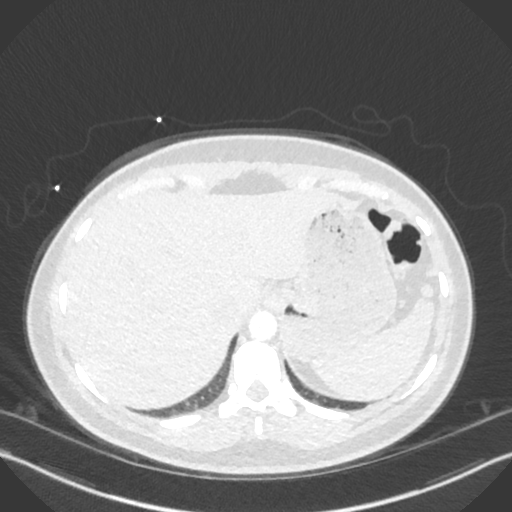
[im 101/386  soft-tissue]
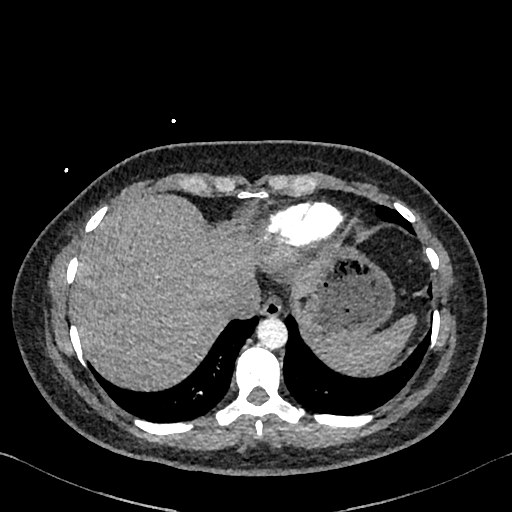
[im 134/386  lung]
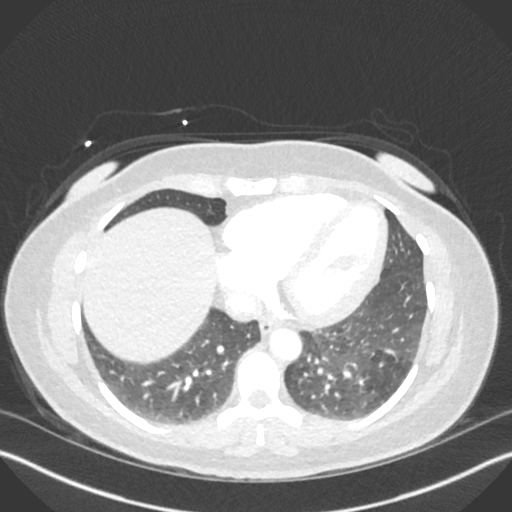
[im 151/386  soft-tissue]
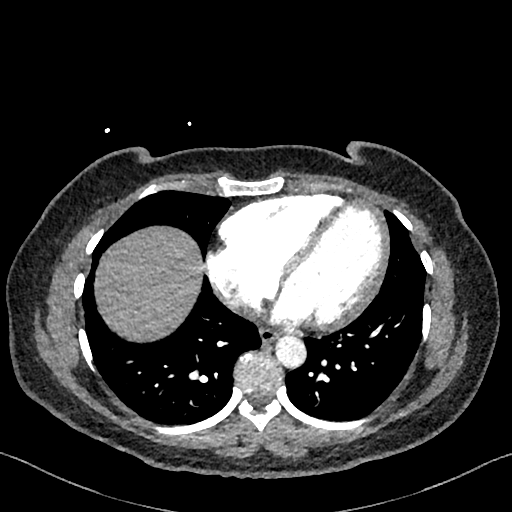
[im 185/386  lung]
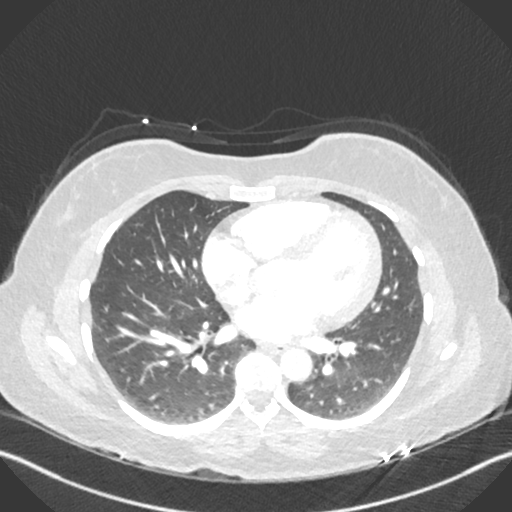
[im 201/386  soft-tissue]
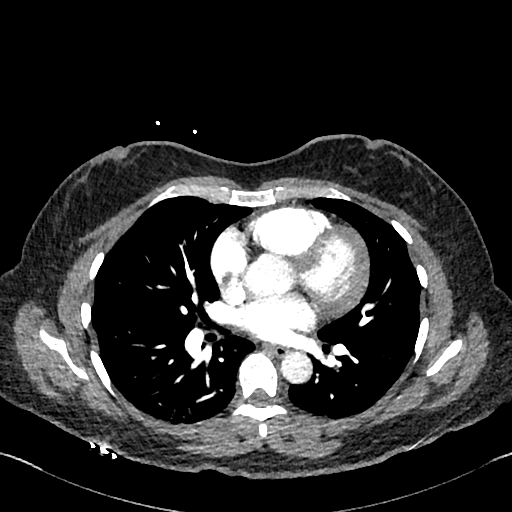
[im 235/386  lung]
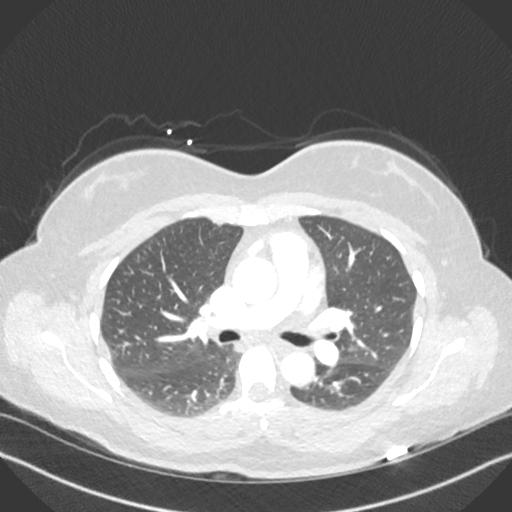
[im 252/386  soft-tissue]
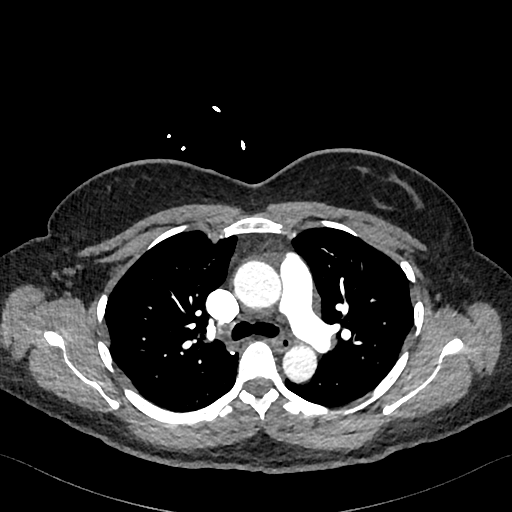
[im 285/386  lung]
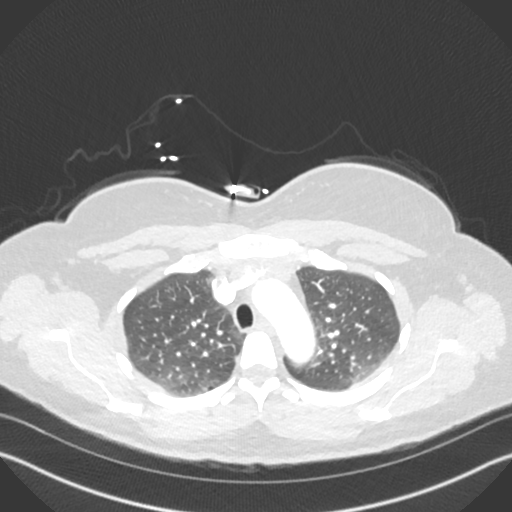
[im 319/386  soft-tissue]
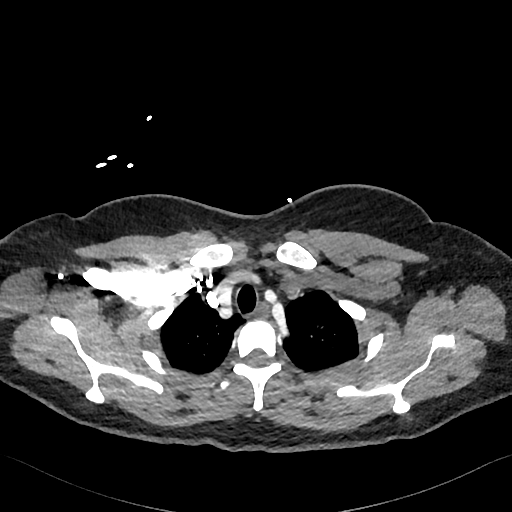
[im 335/386  lung]
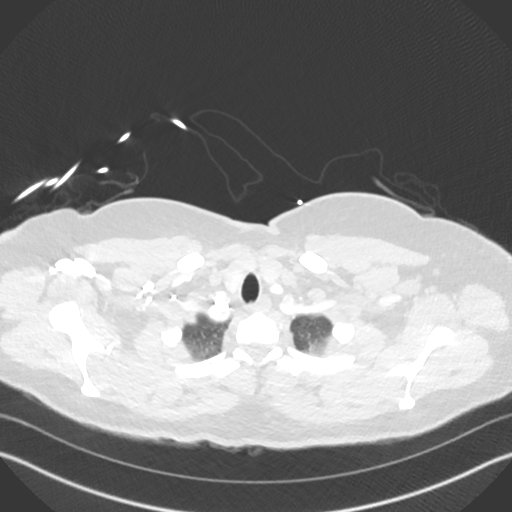
[im 369/386  soft-tissue]
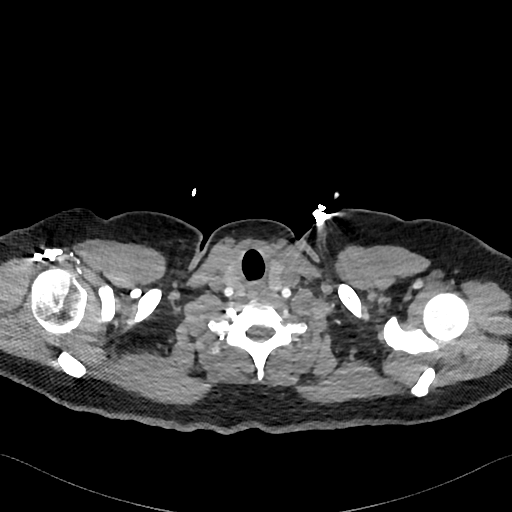

[Series 8: cor · coronal · 0.52mm/px · 3 of 132 slices shown]
[im 33/132  soft-tissue]
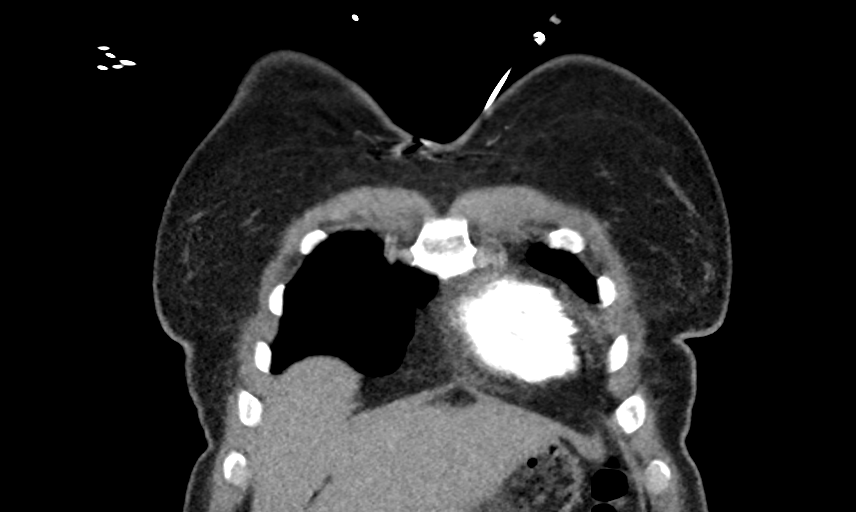
[im 66/132  soft-tissue]
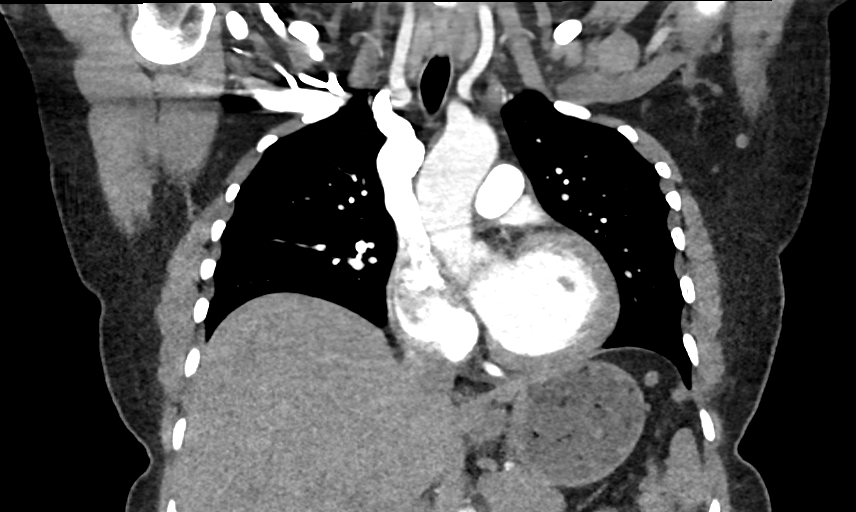
[im 99/132  soft-tissue]
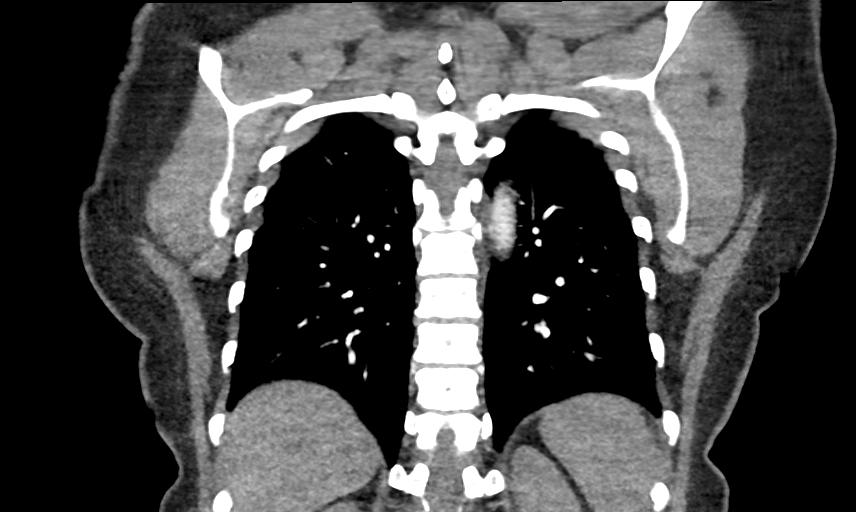

[17 of 46 positions shown; findings below may reference images not displayed]

FINDINGS: Cardiovascular: The heart size is normal. No substantial pericardial
effusion. There is no filling defect within the opacified pulmonary
arteries to suggest the presence of an acute pulmonary embolus.

Mediastinum/Nodes: No mediastinal lymphadenopathy. There is no hilar
lymphadenopathy. The esophagus has normal imaging features. There is
no axillary lymphadenopathy.

Lungs/Pleura: No suspicious pulmonary nodule or mass. No focal
airspace consolidation. There is no evidence of pleural effusion.

Upper Abdomen: Unremarkable.

Musculoskeletal: No worrisome lytic or sclerotic osseous
abnormality.

Review of the MIP images confirms the above findings.
IMPRESSION: 1. No CT evidence for acute pulmonary embolus.
2. No other acute findings in the thorax.

## 2021-09-26 ENCOUNTER — Ambulatory Visit (INDEPENDENT_AMBULATORY_CARE_PROVIDER_SITE_OTHER): Payer: Medicaid Other | Admitting: Family Medicine

## 2021-09-26 ENCOUNTER — Encounter: Payer: Self-pay | Admitting: Family Medicine

## 2021-09-26 VITALS — BP 130/72 | HR 77 | Temp 98.3°F | Resp 17 | Ht 63.5 in | Wt 213.3 lb

## 2021-09-26 DIAGNOSIS — J4520 Mild intermittent asthma, uncomplicated: Secondary | ICD-10-CM | POA: Diagnosis not present

## 2021-09-26 DIAGNOSIS — R062 Wheezing: Secondary | ICD-10-CM

## 2021-09-26 DIAGNOSIS — Z23 Encounter for immunization: Secondary | ICD-10-CM | POA: Diagnosis not present

## 2021-09-26 DIAGNOSIS — J22 Unspecified acute lower respiratory infection: Secondary | ICD-10-CM

## 2021-09-26 DIAGNOSIS — L219 Seborrheic dermatitis, unspecified: Secondary | ICD-10-CM

## 2021-09-26 DIAGNOSIS — K625 Hemorrhage of anus and rectum: Secondary | ICD-10-CM

## 2021-09-26 DIAGNOSIS — K6289 Other specified diseases of anus and rectum: Secondary | ICD-10-CM

## 2021-09-26 DIAGNOSIS — K602 Anal fissure, unspecified: Secondary | ICD-10-CM

## 2021-09-26 DIAGNOSIS — K644 Residual hemorrhoidal skin tags: Secondary | ICD-10-CM

## 2021-09-26 MED ORDER — ALBUTEROL SULFATE HFA 108 (90 BASE) MCG/ACT IN AERS: 2.0000 | INHALATION_SPRAY | Freq: Four times a day (QID) | RESPIRATORY_TRACT | 0 refills | Status: DC | PRN

## 2021-09-26 MED ORDER — KETOCONAZOLE 2 % EX SHAM: MEDICATED_SHAMPOO | CUTANEOUS | 3 refills | Status: DC

## 2021-09-26 MED ORDER — LIDOCAINE (ANORECTAL) 5 % EX GEL: CUTANEOUS | 0 refills | Status: DC

## 2021-09-26 MED ORDER — MONTELUKAST SODIUM 10 MG PO TABS: ORAL_TABLET | ORAL | 3 refills | Status: DC

## 2021-09-26 MED ORDER — FLUTICASONE PROPIONATE 50 MCG/ACT NA SUSP: 1.0000 | Freq: Every day | NASAL | 5 refills | Status: DC

## 2021-09-26 MED ORDER — FLUTICASONE PROPIONATE HFA 44 MCG/ACT IN AERO: 1.0000 | INHALATION_SPRAY | Freq: Two times a day (BID) | RESPIRATORY_TRACT | 6 refills | Status: DC | PRN

## 2021-09-26 MED ORDER — NYSTATIN 100000 UNIT/GM EX POWD: 1.0000 "application " | Freq: Three times a day (TID) | CUTANEOUS | 0 refills | Status: DC

## 2021-09-26 NOTE — Progress Notes (Signed)
Subjective:  Patient ID: Dana Lindsey, female    DOB: 1986-03-21  Age: 36 y.o. MRN: [id number]  CC:  Chief Complaint  Patient presents with   Allergies    Pt reports around October to March has had cold and has had phlem no cough, no other symptoms    Eczema    Pt reports has had dry skin on her scalp notes mostly around her face.     HPI Dana Lindsey presents for   Mild intermittent asthma: Some nasal d/c few weeks ago. Dry throat. Better, but still tickle in throat. No fever. Clearing throat.  Ran out of flonase for few months.  Flovent HFA - also ran out few months ago. No recent flare - some slight wheeze, out of breath when had cold.  No recent albuterol, singulair - ran out.   Seborrheic dermatitis of scalp: Has used ketoconazole in past - worked well - out of meds. More scalp itching past few months.   Hemorrhoids Recurrent, past 15 yrs. No procedures. Bloeeding most days after BM, sore at times. Ripped feeling. Tears at times - has to wipe frequently. Has not seen specialist.  Tx: vaseline.  No recent constipation. No straining.  Some burning/irritation of perianal area during day.   History There are no problems to display for this patient.  Past Medical History:  Diagnosis Date   Asthma    [employer] 03/06/2020   Cholecystolithiasis    Ear infection    Gastritis    Shortness of breath    with exertion   Past Surgical History:  Procedure Laterality Date   CHOLECYSTECTOMY  08/23/2012   Procedure: LAPAROSCOPIC CHOLECYSTECTOMY WITH INTRAOPERATIVE CHOLANGIOGRAM;  Surgeon: Edward Jolly, MD;  Location: [facility] OR;  Service: General;  Laterality: N/A;   No Known Allergies Prior to Admission medications   Medication Sig Start Date End Date Taking? Authorizing Provider  albuterol (VENTOLIN HFA) 108 (90 Base) MCG/ACT inhaler Inhale 2 puffs into the lungs every 6 (six) hours as needed for wheezing or shortness of breath. 01/24/21  Yes Lamptey, Myrene Galas, MD   fluticasone (FLONASE) 50 MCG/ACT nasal spray Place 1 spray into both nostrils daily. 01/31/21  Yes Isla Pence, MD  ketoconazole (NIZORAL) 2 % shampoo APPLY 1 APPLICATION TOPICALLY 2 TIMES A WEEK. IST 2-4 WEEKS, THEN ONCE PER WEEK. 08/05/21  Yes Wendie Agreste, MD  Multiple Vitamins-Minerals (HAIR/SKIN/NAILS/BIOTIN PO) Take 2 tablets by mouth daily.   Yes [provider]  fluticasone (FLOVENT HFA) 44 MCG/ACT inhaler Inhale 1 puff into the lungs 2 (two) times daily as needed. For asthma flair or increased cough. Patient not taking: Reported on 03/08/2021 02/28/21   Wendie Agreste, MD  meloxicam (MOBIC) 7.5 MG tablet Take 1 tablet (7.5 mg total) by mouth 2 (two) times daily as needed for pain. Patient not taking: Reported on 09/26/2021 03/08/21   Truett Mainland, DO  montelukast (SINGULAIR) 10 MG tablet TAKE 1 TABLET BY MOUTH EVERYDAY AT BEDTIME Patient not taking: Reported on 03/08/2021 02/28/21   Wendie Agreste, MD  Multiple Vitamins-Minerals (MULTIVITAMIN ADULT) CHEW Chew 2 tablets by mouth daily. Patient not taking: Reported on 09/26/2021    [provider]   Social History   Socioeconomic History   Marital status: Married    Spouse name: Not on file   Number of children: Not on file   Years of education: Not on file   Highest education level: Not on file  Occupational History  Not on file  Tobacco Use   Smoking status: Never   Smokeless tobacco: Never  Vaping Use   Vaping Use: Never used  Substance and Sexual Activity   Alcohol use: No   Drug use: No   Sexual activity: Yes  Other Topics Concern   Not on file  Social History Narrative   Not on file   Social Determinants of Health   Financial Resource Strain: Not on file  Food Insecurity: Not on file  Transportation Needs: Not on file  Physical Activity: Not on file  Stress: Not on file  Social Connections: Not on file  Intimate Partner Violence: Not on file    Review of Systems   Objective:    Vitals:   09/26/21 1043  BP: 130/72  Pulse: 77  Resp: 17  Temp: 98.3 F (36.8 C)  TempSrc: Temporal  SpO2: 98%  Weight: 213 lb 4.8 oz (96.8 kg)  Height: 5' 3.5" (1.613 m)     Physical Exam Vitals reviewed. Exam conducted with a chaperone present.  Constitutional:      Appearance: Normal appearance. She is well-developed.  HENT:     Head: Normocephalic and atraumatic.  Eyes:     Conjunctiva/sclera: Conjunctivae normal.     Pupils: Pupils are equal, round, and reactive to light.  Neck:     Vascular: No carotid bruit.  Cardiovascular:     Rate and Rhythm: Normal rate and regular rhythm.     Heart sounds: Normal heart sounds.  Pulmonary:     Effort: Pulmonary effort is normal.     Breath sounds: Normal breath sounds.  Abdominal:     Palpations: Abdomen is soft. There is no pulsatile mass.     Tenderness: There is no abdominal tenderness.  Genitourinary:    Rectum: Anal fissure and external hemorrhoid present. No tenderness.     Comments: Assistant Noah Delaine present.  2 small hemorrhoids noted externally without bleeding or thrombosis.  Soft.  Multiple small fissures in perianal area with slight skin irritation.  No discharge.  No active bleeding. Musculoskeletal:     Right lower leg: No edema.     Left lower leg: No edema.  Skin:    General: Skin is warm and dry.     Comments: Minimal dry area of mid scalp, and scalp just behind ears.  No scaling, wounds or discharge.  Neurological:     Mental Status: She is alert and oriented to person, place, and time.  Psychiatric:        Mood and Affect: Mood normal.        Behavior: Behavior normal.       Assessment & Plan:  Dana Lindsey is a 36 y.o. female . Mild intermittent asthma without complication Wheezing - Plan: montelukast (SINGULAIR) 10 MG tablet Lower respiratory infection - Plan: montelukast (SINGULAIR) 10 MG tablet  -Decreased control, off meds.  Restart Flovent, Singulair with albuterol as needed.  If  symptoms improve can taper off Flovent with as needed albuterol.  RTC precautions.  Seborrheic dermatitis of scalp - Plan: ketoconazole (NIZORAL) 2 % shampoo  -Mild symptoms, recurrent off ketoconazole.  Restart at 1-2 times per week, option of daily use if needed first week.  Continue 1-2 times per week for maintenance.  Need for influenza vaccination - Plan: Flu Vaccine QUAD 6+ mos PF IM (Fluarix Quad PF)  External hemorrhoids - Plan: Ambulatory referral to Gastroenterology BRBPR (bright red blood per rectum) - Plan: Ambulatory referral to Gastroenterology Anal fissure -  Plan: nystatin (MYCOSTATIN/NYSTOP) powder, Lidocaine, Anorectal, 5 % GEL Anal irritation - Plan: nystatin (MYCOSTATIN/NYSTOP) powder, Lidocaine, Anorectal, 5 % GEL  Combined hemorrhoid irritation and anal fissure/multiple fissures noted on exam which may also be causing some irritation/burning/discomfort.  Discussed avoidance of over wiping.  We will treat with nystatin powder for possible fungal component.  Lidocaine gel as needed for discomfort.  Consider diltiazem gel if continued fissure discomfort.  Refer to gastroenterology.  RTC precautions  Meds ordered this encounter  Medications   albuterol (VENTOLIN HFA) 108 (90 Base) MCG/ACT inhaler    Sig: Inhale 2 puffs into the lungs every 6 (six) hours as needed for wheezing or shortness of breath.    Dispense:  18 g    Refill:  0   fluticasone (FLONASE) 50 MCG/ACT nasal spray    Sig: Place 1 spray into both nostrils daily.    Dispense:  11.1 mL    Refill:  5   ketoconazole (NIZORAL) 2 % shampoo    Sig: APPLY 1 APPLICATION TOPICALLY 2 TIMES A WEEK. IST 2-4 WEEKS, THEN ONCE PER WEEK.    Dispense:  120 mL    Refill:  3    DX Code Needed  .   fluticasone (FLOVENT HFA) 44 MCG/ACT inhaler    Sig: Inhale 1 puff into the lungs 2 (two) times daily as needed. For asthma flair or increased cough.    Dispense:  1 each    Refill:  6   montelukast (SINGULAIR) 10 MG tablet     Sig: TAKE 1 TABLET BY MOUTH EVERYDAY AT BEDTIME    Dispense:  90 tablet    Refill:  3   nystatin (MYCOSTATIN/NYSTOP) powder    Sig: Apply 1 application topically 3 (three) times daily. To anal area.    Dispense:  15 g    Refill:  0   Lidocaine, Anorectal, 5 % GEL    Sig: Apply pea-sized amount to the perianal skin up to 3 times per day as needed.    Dispense:  30 g    Refill:  0   Patient Instructions  Restart usual allergy and asthma meds and if symptoms not resolving in next week or two - return for recheck. Ok to wean off flovent if asthma well controlled in next month or so.   Restart ketoconazole 2 times per week for scalp. Can use more frequently on the first week if needed.   Although I did see a few hemorrhoids they did not appear to be swollen.  There were some small tears in the skin around the anus.  See information below.  You may have a combination of both hemorrhoids and anal fissures causing the irritation.  I will refer you to gastroenterology but I do recommend trying nystatin powder for now to that area a few times per day and if you are having discomfort can apply the lidocaine gel.  Let me know if there are questions and take care.    Anal Fissure, Adult An anal fissure is a small tear or crack in the tissue of the anus. Bleeding from a fissure usually stops on its own within a few minutes. However, bleeding will often occur again with each bowel movement until the fissure heals. What are the causes? This condition is usually caused by passing a large or hard stool (feces). Other causes include: Constipation. Frequent diarrhea. Inflammatory bowel disease (Crohn's disease or ulcerative colitis). Childbirth. Infections. Anal sex. What are the signs or symptoms? Symptoms of  this condition include: Bleeding from the rectum. Small amounts of blood seen on your stool, on the toilet paper, or in the toilet after a bowel movement. The blood coats the outside of the stool  and is not mixed with the stool. Painful bowel movements. Itching or irritation around the anus. How is this diagnosed? A health care provider may diagnose this condition by closely examining the anal area. An anal fissure can usually be seen with careful inspection. In some cases, a rectal exam may be performed, or a short tube (anoscope) may be used to examine the anal canal. How is this treated? Initial treatment for this condition may include: Taking steps to avoid constipation. This may include making changes to your diet, such as increasing your intake of fiber or fluid. Taking fiber supplements. These supplements can soften your stool to help make bowel movements easier. Your health care provider may also prescribe a stool softener if your stool is hard. Taking sitz baths. This may help to heal the tear. Using medicated creams or ointments. These may be prescribed to lessen discomfort. Treatments that are sometimes used if initial treatments do not work well or if the condition is more severe may include: Botulinum injection. Surgery to repair the fissure. Follow these instructions at home: Eating and drinking  Avoid foods that may cause constipation, such as bananas, milk, and other dairy products. Eat all fruits, except bananas. Drink enough fluid to keep your urine pale yellow. Eat foods that are high in fiber, such as beans, whole grains, and fresh fruits and vegetables. General instructions  Take over-the-counter and prescription medicines only as told by your health care provider. Use creams or ointments only as told by your health care provider. Keep the anal area clean and dry. Take sitz baths as told by your health care provider. Do not use soap in the sitz baths. Keep all follow-up visits as told by your health care provider. This is important. Contact a health care provider if you have: More bleeding. A fever. Diarrhea that is mixed with blood. Pain that  continues. Ongoing problems that are getting worse rather than better. Summary An anal fissure is a small tear or crack in the tissue of the anus. This condition is usually caused by passing a large or hard stool (feces). Other causes include constipation and frequent diarrhea. Initial treatment for this condition may include taking steps to avoid constipation, such as increasing your intake of fiber or fluid. Follow instructions for care as told by your health care provider. Contact your health care provider if you have more bleeding or your problem is getting worse rather than better. Keep all follow-up visits as told by your health care provider. This is important. This information is not intended to replace advice given to you by your health care provider. Make sure you discuss any questions you have with your health care provider. Document Revised: 03/21/2021 Document Reviewed: 03/21/2021 Elsevier Patient Education  2022 [place].  Hemorrhoids Hemorrhoids are swollen veins in and around the rectum or anus. There are two types of hemorrhoids: Internal hemorrhoids. These occur in the veins that are just inside the rectum. They may poke through to the outside and become irritated and painful. External hemorrhoids. These occur in the veins that are outside the anus and can be felt as a painful swelling or hard lump near the anus. Most hemorrhoids do not cause serious problems, and they can be managed with home treatments such as diet and lifestyle  changes. If home treatments do not help the symptoms, procedures can be done to shrink or remove the hemorrhoids. What are the causes? This condition is caused by increased pressure in the anal area. This pressure may result from various things, including: Constipation. Straining to have a bowel movement. Diarrhea. Pregnancy. Obesity. Sitting for long periods of time. Heavy lifting or other activity that causes you to strain. Anal  sex. Riding a bike for a long period of time. What are the signs or symptoms? Symptoms of this condition include: Pain. Anal itching or irritation. Rectal bleeding. Leakage of stool (feces). Anal swelling. One or more lumps around the anus. How is this diagnosed? This condition can often be diagnosed through a visual exam. Other exams or tests may also be done, such as: An exam that involves feeling the rectal area with a gloved hand (digital rectal exam). An exam of the anal canal that is done using a small tube (anoscope). A blood test, if you have lost a significant amount of blood. A test to look inside the colon using a flexible tube with a camera on the end (sigmoidoscopy or colonoscopy). How is this treated? This condition can usually be treated at home. However, various procedures may be done if dietary changes, lifestyle changes, and other home treatments do not help your symptoms. These procedures can help make the hemorrhoids smaller or remove them completely. Some of these procedures involve surgery, and others do not. Common procedures include: Rubber band ligation. Rubber bands are placed at the base of the hemorrhoids to cut off their blood supply. Sclerotherapy. Medicine is injected into the hemorrhoids to shrink them. Infrared coagulation. A type of light energy is used to get rid of the hemorrhoids. Hemorrhoidectomy surgery. The hemorrhoids are surgically removed, and the veins that supply them are tied off. Stapled hemorrhoidopexy surgery. The surgeon staples the base of the hemorrhoid to the rectal wall. Follow these instructions at home: Eating and drinking  Eat foods that have a lot of fiber in them, such as whole grains, beans, nuts, fruits, and vegetables. Ask your health care provider about taking products that have added fiber (fiber supplements). Reduce the amount of fat in your diet. You can do this by eating low-fat dairy products, eating less red meat, and  avoiding processed foods. Drink enough fluid to keep your urine pale yellow. Managing pain and swelling  Take warm sitz baths for 20 minutes, 3-4 times a day to ease pain and discomfort. You may do this in a bathtub or using a portable sitz bath that fits over the toilet. If directed, apply ice to the affected area. Using ice packs between sitz baths may be helpful. Put ice in a plastic bag. Place a towel between your skin and the bag. Leave the ice on for 20 minutes, 2-3 times a day. General instructions Take over-the-counter and prescription medicines only as told by your health care provider. Use medicated creams or suppositories as told. Get regular exercise. Ask your health care provider how much and what kind of exercise is best for you. In general, you should do moderate exercise for at least 30 minutes on most days of the week (150 minutes each week). This can include activities such as walking, biking, or yoga. Go to the bathroom when you have the urge to have a bowel movement. Do not wait. Avoid straining to have bowel movements. Keep the anal area dry and clean. Use wet toilet paper or moist towelettes after a bowel  movement. Do not sit on the toilet for long periods of time. This increases blood pooling and pain. Keep all follow-up visits as told by your health care provider. This is important. Contact a health care provider if you have: Increasing pain and swelling that are not controlled by treatment or medicine. Difficulty having a bowel movement, or you are unable to have a bowel movement. Pain or inflammation outside the area of the hemorrhoids. Get help right away if you have: Uncontrolled bleeding from your rectum. Summary Hemorrhoids are swollen veins in and around the rectum or anus. Most hemorrhoids can be managed with home treatments such as diet and lifestyle changes. Taking warm sitz baths can help ease pain and discomfort. In severe cases, procedures or surgery  can be done to shrink or remove the hemorrhoids. This information is not intended to replace advice given to you by your health care provider. Make sure you discuss any questions you have with your health care provider. Document Revised: 03/06/2021 Document Reviewed: 03/06/2021 Elsevier Patient Education  2022 [place],   Merri Ray, MD [place] 09/26/21 5:58 PM

## 2021-09-26 NOTE — Patient Instructions (Addendum)
Restart usual allergy and asthma meds and if symptoms not resolving in next week or two - return for recheck. Ok to wean off flovent if asthma well controlled in next month or so.   Restart ketoconazole 2 times per week for scalp. Can use more frequently on the first week if needed.   Although I did see a few hemorrhoids they did not appear to be swollen.  There were some small tears in the skin around the anus.  See information below.  You may have a combination of both hemorrhoids and anal fissures causing the irritation.  I will refer you to gastroenterology but I do recommend trying nystatin powder for now to that area a few times per day and if you are having discomfort can apply the lidocaine gel.  Let me know if there are questions and take care.    Anal Fissure, Adult An anal fissure is a small tear or crack in the tissue of the anus. Bleeding from a fissure usually stops on its own within a few minutes. However, bleeding will often occur again with each bowel movement until the fissure heals. What are the causes? This condition is usually caused by passing a large or hard stool (feces). Other causes include: Constipation. Frequent diarrhea. Inflammatory bowel disease (Crohn's disease or ulcerative colitis). Childbirth. Infections. Anal sex. What are the signs or symptoms? Symptoms of this condition include: Bleeding from the rectum. Small amounts of blood seen on your stool, on the toilet paper, or in the toilet after a bowel movement. The blood coats the outside of the stool and is not mixed with the stool. Painful bowel movements. Itching or irritation around the anus. How is this diagnosed? A health care provider may diagnose this condition by closely examining the anal area. An anal fissure can usually be seen with careful inspection. In some cases, a rectal exam may be performed, or a short tube (anoscope) may be used to examine the anal canal. How is this treated? Initial  treatment for this condition may include: Taking steps to avoid constipation. This may include making changes to your diet, such as increasing your intake of fiber or fluid. Taking fiber supplements. These supplements can soften your stool to help make bowel movements easier. Your health care provider may also prescribe a stool softener if your stool is hard. Taking sitz baths. This may help to heal the tear. Using medicated creams or ointments. These may be prescribed to lessen discomfort. Treatments that are sometimes used if initial treatments do not work well or if the condition is more severe may include: Botulinum injection. Surgery to repair the fissure. Follow these instructions at home: Eating and drinking  Avoid foods that may cause constipation, such as bananas, milk, and other dairy products. Eat all fruits, except bananas. Drink enough fluid to keep your urine pale yellow. Eat foods that are high in fiber, such as beans, whole grains, and fresh fruits and vegetables. General instructions  Take over-the-counter and prescription medicines only as told by your health care provider. Use creams or ointments only as told by your health care provider. Keep the anal area clean and dry. Take sitz baths as told by your health care provider. Do not use soap in the sitz baths. Keep all follow-up visits as told by your health care provider. This is important. Contact a health care provider if you have: More bleeding. A fever. Diarrhea that is mixed with blood. Pain that continues. Ongoing problems that are getting  worse rather than better. Summary An anal fissure is a small tear or crack in the tissue of the anus. This condition is usually caused by passing a large or hard stool (feces). Other causes include constipation and frequent diarrhea. Initial treatment for this condition may include taking steps to avoid constipation, such as increasing your intake of fiber or fluid. Follow  instructions for care as told by your health care provider. Contact your health care provider if you have more bleeding or your problem is getting worse rather than better. Keep all follow-up visits as told by your health care provider. This is important. This information is not intended to replace advice given to you by your health care provider. Make sure you discuss any questions you have with your health care provider. Document Revised: 03/21/2021 Document Reviewed: 03/21/2021 Elsevier Patient Education  2022 [employer].  Hemorrhoids Hemorrhoids are swollen veins in and around the rectum or anus. There are two types of hemorrhoids: Internal hemorrhoids. These occur in the veins that are just inside the rectum. They may poke through to the outside and become irritated and painful. External hemorrhoids. These occur in the veins that are outside the anus and can be felt as a painful swelling or hard lump near the anus. Most hemorrhoids do not cause serious problems, and they can be managed with home treatments such as diet and lifestyle changes. If home treatments do not help the symptoms, procedures can be done to shrink or remove the hemorrhoids. What are the causes? This condition is caused by increased pressure in the anal area. This pressure may result from various things, including: Constipation. Straining to have a bowel movement. Diarrhea. Pregnancy. Obesity. Sitting for long periods of time. Heavy lifting or other activity that causes you to strain. Anal sex. Riding a bike for a long period of time. What are the signs or symptoms? Symptoms of this condition include: Pain. Anal itching or irritation. Rectal bleeding. Leakage of stool (feces). Anal swelling. One or more lumps around the anus. How is this diagnosed? This condition can often be diagnosed through a visual exam. Other exams or tests may also be done, such as: An exam that involves feeling the rectal area with  a gloved hand (digital rectal exam). An exam of the anal canal that is done using a small tube (anoscope). A blood test, if you have lost a significant amount of blood. A test to look inside the colon using a flexible tube with a camera on the end (sigmoidoscopy or colonoscopy). How is this treated? This condition can usually be treated at home. However, various procedures may be done if dietary changes, lifestyle changes, and other home treatments do not help your symptoms. These procedures can help make the hemorrhoids smaller or remove them completely. Some of these procedures involve surgery, and others do not. Common procedures include: Rubber band ligation. Rubber bands are placed at the base of the hemorrhoids to cut off their blood supply. Sclerotherapy. Medicine is injected into the hemorrhoids to shrink them. Infrared coagulation. A type of light energy is used to get rid of the hemorrhoids. Hemorrhoidectomy surgery. The hemorrhoids are surgically removed, and the veins that supply them are tied off. Stapled hemorrhoidopexy surgery. The surgeon staples the base of the hemorrhoid to the rectal wall. Follow these instructions at home: Eating and drinking  Eat foods that have a lot of fiber in them, such as whole grains, beans, nuts, fruits, and vegetables. Ask your health care provider about  taking products that have added fiber (fiber supplements). Reduce the amount of fat in your diet. You can do this by eating low-fat dairy products, eating less red meat, and avoiding processed foods. Drink enough fluid to keep your urine pale yellow. Managing pain and swelling  Take warm sitz baths for 20 minutes, 3-4 times a day to ease pain and discomfort. You may do this in a bathtub or using a portable sitz bath that fits over the toilet. If directed, apply ice to the affected area. Using ice packs between sitz baths may be helpful. Put ice in a plastic bag. Place a towel between your skin and  the bag. Leave the ice on for 20 minutes, 2-3 times a day. General instructions Take over-the-counter and prescription medicines only as told by your health care provider. Use medicated creams or suppositories as told. Get regular exercise. Ask your health care provider how much and what kind of exercise is best for you. In general, you should do moderate exercise for at least 30 minutes on most days of the week (150 minutes each week). This can include activities such as walking, biking, or yoga. Go to the bathroom when you have the urge to have a bowel movement. Do not wait. Avoid straining to have bowel movements. Keep the anal area dry and clean. Use wet toilet paper or moist towelettes after a bowel movement. Do not sit on the toilet for long periods of time. This increases blood pooling and pain. Keep all follow-up visits as told by your health care provider. This is important. Contact a health care provider if you have: Increasing pain and swelling that are not controlled by treatment or medicine. Difficulty having a bowel movement, or you are unable to have a bowel movement. Pain or inflammation outside the area of the hemorrhoids. Get help right away if you have: Uncontrolled bleeding from your rectum. Summary Hemorrhoids are swollen veins in and around the rectum or anus. Most hemorrhoids can be managed with home treatments such as diet and lifestyle changes. Taking warm sitz baths can help ease pain and discomfort. In severe cases, procedures or surgery can be done to shrink or remove the hemorrhoids. This information is not intended to replace advice given to you by your health care provider. Make sure you discuss any questions you have with your health care provider. Document Revised: 03/06/2021 Document Reviewed: 03/06/2021 Elsevier Patient Education  [place].

## 2021-11-05 ENCOUNTER — Telehealth: Payer: Self-pay | Admitting: Family Medicine

## 2021-11-05 NOTE — Telephone Encounter (Signed)
Pt states that Dr Nyoka Cowden was going to refer her to [facility] last month after her appt with him but she still haven't heard anything yet. She is doing  follow up. She said states that she can be reached at [phone].  Please advice  Thank you

## 2021-11-05 NOTE — Telephone Encounter (Signed)
Spoke to patient and let her know that I looked like [facility] attempted to contact her, I walked her through how to view it on my chart. She found the message and the info to call them back

## 2021-12-05 ENCOUNTER — Encounter: Payer: Self-pay | Admitting: Family Medicine

## 2021-12-05 ENCOUNTER — Encounter: Payer: Self-pay | Admitting: Physician Assistant

## 2021-12-05 ENCOUNTER — Ambulatory Visit (INDEPENDENT_AMBULATORY_CARE_PROVIDER_SITE_OTHER): Payer: Medicaid Other | Admitting: Family Medicine

## 2021-12-05 VITALS — BP 122/76 | HR 77 | Temp 98.2°F | Resp 16 | Ht 63.5 in | Wt 206.2 lb

## 2021-12-05 DIAGNOSIS — Z1159 Encounter for screening for other viral diseases: Secondary | ICD-10-CM

## 2021-12-05 DIAGNOSIS — Z131 Encounter for screening for diabetes mellitus: Secondary | ICD-10-CM

## 2021-12-05 DIAGNOSIS — Z0000 Encounter for general adult medical examination without abnormal findings: Secondary | ICD-10-CM

## 2021-12-05 DIAGNOSIS — Z13220 Encounter for screening for lipoid disorders: Secondary | ICD-10-CM | POA: Diagnosis not present

## 2021-12-05 DIAGNOSIS — Z130 Encounter for screening for diseases of the blood and blood-forming organs and certain disorders involving the immune mechanism: Secondary | ICD-10-CM

## 2021-12-05 DIAGNOSIS — K644 Residual hemorrhoidal skin tags: Secondary | ICD-10-CM

## 2021-12-05 DIAGNOSIS — L219 Seborrheic dermatitis, unspecified: Secondary | ICD-10-CM

## 2021-12-05 DIAGNOSIS — J4520 Mild intermittent asthma, uncomplicated: Secondary | ICD-10-CM

## 2021-12-05 DIAGNOSIS — K625 Hemorrhage of anus and rectum: Secondary | ICD-10-CM

## 2021-12-05 NOTE — Patient Instructions (Addendum)
Lidocaine gel for now, call gastroenterology to line up appointment. Try to avoid overwiping/repetitive wiping.  ?No med changes.  ?Call eye specialist for follow up.  ?Congrats on the weight loss! Adding walking or other low intensity exercise will help as well. Goal of 172mn per week.  ? ?Fasting labs in next week.  ?[facility] ?Walk in 8:30-4:30 during weekdays, no appointment needed ?[address] [id number]? ? ?Preventive Care [id number] Old, Female ?Preventive care refers to lifestyle choices and visits with your health care provider that can promote health and wellness. Preventive care visits are also called wellness exams. ?What can I expect for my preventive care visit? ?Counseling ?During your preventive care visit, your health care provider may ask about your: ?Medical history, including: ?Past medical problems. ?Family medical history. ?Pregnancy history. ?Current health, including: ?Menstrual cycle. ?Method of birth control. ?Emotional well-being. ?Home life and relationship well-being. ?Sexual activity and sexual health. ?Lifestyle, including: ?Alcohol, nicotine or tobacco, and drug use. ?Access to firearms. ?Diet, exercise, and sleep habits. ?Work and work eStatistician ?Sunscreen use. ?Safety issues such as seatbelt and bike helmet use. ?Physical exam ?Your health care provider may check your: ?Height and weight. These may be used to calculate your BMI (body mass index). BMI is a measurement that tells if you are at a healthy weight. ?Waist circumference. This measures the distance around your waistline. This measurement also tells if you are at a healthy weight and may help predict your risk of certain diseases, such as type 2 diabetes and high blood pressure. ?Heart rate and blood pressure. ?Body temperature. ?Skin for abnormal spots. ?What immunizations do I need? ?Vaccines are usually given at various ages, according to a schedule. Your health care provider will recommend  vaccines for you based on your age, medical history, and lifestyle or other factors, such as travel or where you work. ?What tests do I need? ?Screening ?Your health care provider may recommend screening tests for certain conditions. This may include: ?Pelvic exam and Pap test. ?Lipid and cholesterol levels. ?Diabetes screening. This is done by checking your blood sugar (glucose) after you have not eaten for a while (fasting). ?Hepatitis B test. ?Hepatitis C test. ?HIV (human immunodeficiency virus) test. ?STI (sexually transmitted infection) testing, if you are at risk. ?BRCA-related cancer screening. This may be done if you have a family history of breast, ovarian, tubal, or peritoneal cancers. ?Talk with your health care provider about your test results, treatment options, and if necessary, the need for more tests. ?Follow these instructions at home: ?Eating and drinking ? ?Eat a healthy diet that includes fresh fruits and vegetables, whole grains, lean protein, and low-fat dairy products. ?Take vitamin and mineral supplements as recommended by your health care provider. ?Do not drink alcohol if: ?Your health care provider tells you not to drink. ?You are pregnant, may be pregnant, or are planning to become pregnant. ?If you drink alcohol: ?Limit how much you have to 0-1 drink a day. ?Know how much alcohol is in your drink. In the [place] one drink equals one 12 oz bottle of beer (355 mL), one 5 oz glass of wine (148 mL), or one 1? oz glass of hard liquor (44 mL). ?Lifestyle ?Brush your teeth every morning and night with fluoride toothpaste. Floss one time each day. ?Exercise for at least 30 minutes 5 or more days each week. ?Do not use any products that contain nicotine or tobacco. These products include cigarettes, chewing tobacco, and  vaping devices, such as e-cigarettes. If you need help quitting, ask your health care provider. ?Do not use drugs. ?If you are sexually active, practice safe sex. Use a condom or  other form of protection to prevent STIs. ?If you do not wish to become pregnant, use a form of birth control. If you plan to become pregnant, see your health care provider for a prepregnancy visit. ?Find healthy ways to manage stress, such as: ?Meditation, yoga, or listening to music. ?Journaling. ?Talking to a trusted person. ?Spending time with friends and family. ?Minimize exposure to UV radiation to reduce your risk of skin cancer. ?Safety ?Always wear your seat belt while driving or riding in a vehicle. ?Do not drive: ?If you have been drinking alcohol. Do not ride with someone who has been drinking. ?If you have been using any mind-altering substances or drugs. ?While texting. ?When you are tired or distracted. ?Wear a helmet and other protective equipment during sports activities. ?If you have firearms in your house, make sure you follow all gun safety procedures. ?Seek help if you have been physically or sexually abused. ?What's next? ?Go to your health care provider once a year for an annual wellness visit. ?Ask your health care provider how often you should have your eyes and teeth checked. ?Stay up to date on all vaccines. ?This information is not intended to replace advice given to you by your health care provider. Make sure you discuss any questions you have with your health care provider. ?Document Revised: 02/20/2021 Document Reviewed: 02/20/2021 ?Elsevier Patient Education ? 2022 [place]. ? ? ?

## 2021-12-05 NOTE — Progress Notes (Addendum)
? ?Subjective:  ?Patient ID: Dana Lindsey, female    DOB: Sep 10, 1985  Age: 36 y.o. MRN: [id number] ? ?CC:  ?Chief Complaint  ?Patient presents with  ? Annual Exam  ?  Pt doing well today, no concerns   ? ? ?HPI ?Dana Lindsey presents for Annual Exam ? ?Mild intermittent asthma discussed January 19 visit.  She was out of Flonase for a few months and Flovent at that time.  Restarted both along with Singulair, albuterol as needed. ?Asthma, allergies are better.  ? ?Also discussed seborrheic dermatitis of scalp, ketoconazole shampoo prescribed with initial treatment and then maintenance intervals. ?Better. Only using ketoconazole as needed - once per week usually.  ? ?Hemorrhoid irritation, anal fissures discussed last visit.  Treated with nystatin powder for possible fungal component as well as lidocaine gel.  Referred to gastroenterology.  Option of diltiazem gel. ?About the same.  ?Lidocaine gel helps. Some blood at times with straining or firm stools, or too soft. Repeat wiping causes bleeding. No pain with actual BM.  ?Has not scheduled GI appointment yet- some calls back and forth.  ? ? ? ? ? ?  12/05/2021  ?  3:12 PM 09/26/2021  ? 10:46 AM 05/23/2020  ?  4:09 PM 04/23/2020  ?  3:10 PM 03/09/2020  ? 10:30 AM  ?Depression screen PHQ 2/9  ?Decreased Interest 0 0 0 0 0  ?Down, Depressed, Hopeless 0 0 0 0 0  ?PHQ - 2 Score 0 0 0 0 0  ? ? ?Health Maintenance  ?Topic Date Due  ? Hepatitis C Screening  Never done  ? PAP SMEAR-Modifier  12/05/2021 (Originally 12/18/2019)  ? COVID-19 Vaccine (4 - Booster for [place] series) 12/21/2021 (Originally 09/10/2020)  ? TETANUS/TDAP  04/10/2027  ? INFLUENZA VACCINE  Completed  ? HIV Screening  Completed  ? HPV VACCINES  Aged Out  ?Agrees to hep C test.  ?Gyn - Dr. Nehemiah Settle. Pap UTD in 2021 ? ? ?Immunization History  ?Administered Date(s) Administered  ? Influenza,inj,Quad PF,6+ Mos 06/03/2017, 07/23/2018, 09/26/2021  ? PFIZER(Purple Top)SARS-COV-2 Vaccination 11/29/2019, 12/20/2019,  07/16/2020  ? Tdap 04/09/2017  ?Declines covid booster.  ? ?Vision Screening  ? Right eye Left eye Both eyes  ?Without correction     ?With correction '20/35 20/25 20/25 '$  ?Glasses. Due for optho visit.  ? ?Dental: every 6 months ? ?Alcohol: less than 1 per week.  ? ?Tobacco: none.  ? ?Exercise/obesity: ?Weight down 7#, eating healthier. ?No regular exercise.  ?Body mass index is 35.95 kg/m?. ?Wt Readings from Last 3 Encounters:  ?12/05/21 206 lb 3.2 oz (93.5 kg)  ?09/26/21 213 lb 4.8 oz (96.8 kg)  ?06/12/21 206 lb (93.4 kg)  ? ?Declines sti screening.  ? ?History ?There are no problems to display for this patient. ? ?Past Medical History:  ?Diagnosis Date  ? Asthma   ? [employer] 03/06/2020  ? Cholecystolithiasis   ? Ear infection   ? Gastritis   ? Shortness of breath   ? with exertion  ? ?Past Surgical History:  ?Procedure Laterality Date  ? CHOLECYSTECTOMY  08/23/2012  ? Procedure: LAPAROSCOPIC CHOLECYSTECTOMY WITH INTRAOPERATIVE CHOLANGIOGRAM;  Surgeon: Edward Jolly, MD;  Location: [facility];  Service: General;  Laterality: N/A;  ? ?No Known Allergies ?Prior to Admission medications   ?Medication Sig Start Date End Date Taking? Authorizing Provider  ?albuterol (VENTOLIN HFA) 108 (90 Base) MCG/ACT inhaler Inhale 2 puffs into the lungs every 6 (six) hours as needed for wheezing or shortness  of breath. 09/26/21  Yes Wendie Agreste, MD  ?fluticasone (FLONASE) 50 MCG/ACT nasal spray Place 1 spray into both nostrils daily. 09/26/21  Yes Wendie Agreste, MD  ?fluticasone (FLOVENT HFA) 44 MCG/ACT inhaler Inhale 1 puff into the lungs 2 (two) times daily as needed. For asthma flair or increased cough. 09/26/21  Yes Wendie Agreste, MD  ?ketoconazole (NIZORAL) 2 % shampoo APPLY 1 APPLICATION TOPICALLY 2 TIMES A WEEK. IST 2-4 WEEKS, THEN ONCE PER WEEK. 09/26/21  Yes Wendie Agreste, MD  ?Lidocaine, Anorectal, 5 % GEL Apply pea-sized amount to the perianal skin up to 3 times per day as needed. 09/26/21  Yes Wendie Agreste, MD  ?montelukast (SINGULAIR) 10 MG tablet TAKE 1 TABLET BY MOUTH EVERYDAY AT BEDTIME 09/26/21  Yes Wendie Agreste, MD  ?Multiple Vitamins-Minerals (HAIR/SKIN/NAILS/BIOTIN PO) Take 2 tablets by mouth daily.   Yes [provider]  ?nystatin (MYCOSTATIN/NYSTOP) powder Apply 1 application topically 3 (three) times daily. To anal area. 09/26/21  Yes Wendie Agreste, MD  ?meloxicam (MOBIC) 7.5 MG tablet Take 1 tablet (7.5 mg total) by mouth 2 (two) times daily as needed for pain. ?Patient not taking: Reported on 09/26/2021 03/08/21   Truett Mainland, DO  ?Multiple Vitamins-Minerals (MULTIVITAMIN ADULT) CHEW Chew 2 tablets by mouth daily. ?Patient not taking: Reported on 09/26/2021    [provider]  ? ?Social History  ? ?Socioeconomic History  ? Marital status: Married  ?  Spouse name: Not on file  ? Number of children: Not on file  ? Years of education: Not on file  ? Highest education level: Not on file  ?Occupational History  ? Not on file  ?Tobacco Use  ? Smoking status: Never  ? Smokeless tobacco: Never  ?Vaping Use  ? Vaping Use: Never used  ?Substance and Sexual Activity  ? Alcohol use: No  ?  Comment: rare  ? Drug use: No  ? Sexual activity: Yes  ?Other Topics Concern  ? Not on file  ?Social History Narrative  ? Not on file  ? ?Social Determinants of Health  ? ?Financial Resource Strain: Not on file  ?Food Insecurity: Not on file  ?Transportation Needs: Not on file  ?Physical Activity: Not on file  ?Stress: Not on file  ?Social Connections: Not on file  ?Intimate Partner Violence: Not on file  ? ?Review of Systems ?13 point review of systems per patient health survey noted.  Negative other than as indicated above or in HPI.  ? ? ?Objective:  ? ?Vitals:  ? 12/05/21 1508  ?BP: 122/76  ?Pulse: 77  ?Resp: 16  ?Temp: 98.2 ?F (36.8 ?C)  ?TempSrc: Temporal  ?SpO2: 97%  ?Weight: 206 lb 3.2 oz (93.5 kg)  ?Height: 5' 3.5" (1.613 m)  ? ? ? ?Physical Exam ?Constitutional:   ?   Appearance: She is  well-developed.  ?HENT:  ?   Head: Normocephalic and atraumatic.  ?   Right Ear: External ear normal.  ?   Left Ear: External ear normal.  ?Eyes:  ?   Conjunctiva/sclera: Conjunctivae normal.  ?   Pupils: Pupils are equal, round, and reactive to light.  ?Neck:  ?   Thyroid: No thyromegaly.  ?Cardiovascular:  ?   Rate and Rhythm: Normal rate and regular rhythm.  ?   Heart sounds: Normal heart sounds. No murmur heard. ?Pulmonary:  ?   Effort: Pulmonary effort is normal. No respiratory distress.  ?   Breath sounds: Normal breath  sounds. No wheezing.  ?Abdominal:  ?   General: Bowel sounds are normal.  ?   Palpations: Abdomen is soft.  ?   Tenderness: There is abdominal tenderness (Suprapubic, reports chronic pain discussed with GYN previously.).  ?Musculoskeletal:     ?   General: No tenderness. Normal range of motion.  ?   Cervical back: Normal range of motion and neck supple.  ?Lymphadenopathy:  ?   Cervical: No cervical adenopathy.  ?Skin: ?   General: Skin is warm and dry.  ?   Findings: No rash.  ?Neurological:  ?   Mental Status: She is alert and oriented to person, place, and time.  ?Psychiatric:     ?   Behavior: Behavior normal.     ?   Thought Content: Thought content normal.  ? ? ? ? ? ?Assessment & Plan:  ?ALIEYAH SPADER is a 36 y.o. female . ?Annual physical exam - Plan: Hepatitis C antibody, Comprehensive metabolic panel, Lipid panel, CBC with Differential/Platelet, Hemoglobin A1c ? - -anticipatory guidance as below in AVS, screening labs above. Health maintenance items as above in HPI discussed/recommended as applicable.  Commended on weight loss, and addition of exercise discussed as well. ? ?Screening for diabetes mellitus - Plan: Comprehensive metabolic panel, Hemoglobin A1c ? ?Screening for deficiency anemia - Plan: Comprehensive metabolic panel, CBC with Differential/Platelet ? ?Screening for hyperlipidemia - Plan: Comprehensive metabolic panel, Lipid panel ? ?Need for hepatitis C screening test -  Plan: Hepatitis C antibody, Comprehensive metabolic panel ? ?BRBPR (bright red blood per rectum) ?External hemorrhoids ? -Still intermittent symptoms, no pain with defecation less likely fissure.  Option of

## 2021-12-25 ENCOUNTER — Ambulatory Visit (INDEPENDENT_AMBULATORY_CARE_PROVIDER_SITE_OTHER): Payer: Medicaid Other | Admitting: Physician Assistant

## 2021-12-25 ENCOUNTER — Encounter: Payer: Self-pay | Admitting: Physician Assistant

## 2021-12-25 VITALS — BP 122/84 | HR 77 | Ht 63.0 in | Wt 211.4 lb

## 2021-12-25 DIAGNOSIS — K625 Hemorrhage of anus and rectum: Secondary | ICD-10-CM

## 2021-12-25 DIAGNOSIS — K641 Second degree hemorrhoids: Secondary | ICD-10-CM

## 2021-12-25 MED ORDER — HYDROCORTISONE ACETATE 25 MG RE SUPP: 25.0000 mg | Freq: Two times a day (BID) | RECTAL | 1 refills | Status: DC

## 2021-12-25 NOTE — Progress Notes (Signed)
? ?Chief Complaint: Rectal bleeding ? ?HPI: ?   Dana Lindsey is a 36 year old female with a past medical history as listed below, who was referred to me by Wendie Agreste, MD for a complaint of rectal bleeding. ?   06/12/2021 CBC was normal. ?   12/05/2021 CBC was ordered but not done. ?   12/05/2021 annual visit with PCP.  At that time discussed hemorrhoid irritation, anal fissures.  They were treated with nystatin powder for possible fungal component as well as lidocaine gel.  She was referred to [place].  Lidocaine helped some she had blood at times of straining or firm stools. ?   Today, the patient tells me that ever since having her first child back in 2007 she has had trouble with hemorrhoids.  Initially given "the squirt bottle and witch hazel", but this never really worked.  Now after having 4 kids, the last one 3 years ago everything has gotten worse with time.  Tells me that she typically has a soft "messy stool", which is somewhat hard to clean so she does a lot of wiping.  She will wipe with toilet paper than a wet wipe and then dab dry.  Does describe sometimes she will have some liquid which seems to drain out of her rectum when she is going about her day which requires extra cleaning.  Describes itching and a foul odor at times as well as some bright red blood which she sees at least every month, sometimes dripping and other times just on the toilet paper.  Does describe that about once a month she will get what feels like a "tear" in her rectum which will then slowly resolve, but during those times she sees more bleeding.  Recently given Lidocaine ointment which she applies and seems to help with the stinging/itching.  Occasional constipation. ?   Denies fever, chills, weight loss, abdominal pain or symptoms that awaken her from sleep. ? ?Past Medical History:  ?Diagnosis Date  ? Asthma   ? Phreesia 03/06/2020  ? Cholecystolithiasis   ? Ear infection   ? Gastritis   ? Shortness of breath   ? with  exertion  ? ? ?Past Surgical History:  ?Procedure Laterality Date  ? CHOLECYSTECTOMY  08/23/2012  ? Procedure: LAPAROSCOPIC CHOLECYSTECTOMY WITH INTRAOPERATIVE CHOLANGIOGRAM;  Surgeon: Edward Jolly, MD;  Location: [facility];  Service: General;  Laterality: N/A;  ? ? ?Current Outpatient Medications  ?Medication Sig Dispense Refill  ? albuterol (VENTOLIN HFA) 108 (90 Base) MCG/ACT inhaler Inhale 2 puffs into the lungs every 6 (six) hours as needed for wheezing or shortness of breath. 18 g 0  ? fluticasone (FLONASE) 50 MCG/ACT nasal spray Place 1 spray into both nostrils daily. 11.1 mL 5  ? fluticasone (FLOVENT HFA) 44 MCG/ACT inhaler Inhale 1 puff into the lungs 2 (two) times daily as needed. For asthma flair or increased cough. 1 each 6  ? ketoconazole (NIZORAL) 2 % shampoo APPLY 1 APPLICATION TOPICALLY 2 TIMES A WEEK. IST 2-4 WEEKS, THEN ONCE PER WEEK. 120 mL 3  ? Lidocaine, Anorectal, 5 % GEL Apply pea-sized amount to the perianal skin up to 3 times per day as needed. 30 g 0  ? meloxicam (MOBIC) 7.5 MG tablet Take 1 tablet (7.5 mg total) by mouth 2 (two) times daily as needed for pain. 30 tablet 3  ? montelukast (SINGULAIR) 10 MG tablet TAKE 1 TABLET BY MOUTH EVERYDAY AT BEDTIME 90 tablet 3  ? Multiple Vitamins-Minerals (HAIR/SKIN/NAILS/BIOTIN PO)  Take 2 tablets by mouth daily.    ? Multiple Vitamins-Minerals (MULTIVITAMIN ADULT) CHEW Chew 2 tablets by mouth daily.    ? nystatin (MYCOSTATIN/NYSTOP) powder Apply 1 application topically 3 (three) times daily. To anal area. 15 g 0  ? ?No current facility-administered medications for this visit.  ? ? ?Allergies as of 12/25/2021  ? (No Known Allergies)  ? ? ?Family History  ?Problem Relation Age of Onset  ? Alcoholism Father   ? Asthma Daughter   ? Asthma Son   ? ? ?Social History  ? ?Socioeconomic History  ? Marital status: Married  ?  Spouse name: Not on file  ? Number of children: Not on file  ? Years of education: Not on file  ? Highest education level: Not on  file  ?Occupational History  ? Not on file  ?Tobacco Use  ? Smoking status: Never  ? Smokeless tobacco: Never  ?Vaping Use  ? Vaping Use: Never used  ?Substance and Sexual Activity  ? Alcohol use: No  ?  Comment: rare  ? Drug use: No  ? Sexual activity: Yes  ?Other Topics Concern  ? Not on file  ?Social History Narrative  ? Not on file  ? ?Social Determinants of Health  ? ?Financial Resource Strain: Not on file  ?Food Insecurity: Not on file  ?Transportation Needs: Not on file  ?Physical Activity: Not on file  ?Stress: Not on file  ?Social Connections: Not on file  ?Intimate Partner Violence: Not on file  ? ? ?Review of Systems:    ?Constitutional: No weight loss, fever or chills ?Skin: No rash  ?Cardiovascular: No chest pain  ?Respiratory: No SOB ?Gastrointestinal: See HPI and otherwise negative ?Genitourinary: No dysuria  ?Neurological: No headache, dizziness or syncope ?Musculoskeletal: No new muscle or joint pain ?Hematologic: No bruising ?Psychiatric: No history of depression or anxiety ? ? Physical Exam:  ?Vital signs: ?BP 122/84   Pulse 77   Ht '5\' 3"'$  (1.6 m)   Wt 211 lb 6.4 oz (95.9 kg)   SpO2 98%   BMI 37.45 kg/m?   ? ?Constitutional:   P[place] appears to be in NAD, Well developed, Well nourished, alert and cooperative ?Head:  Normocephalic and atraumatic. ?Eyes:   PEERL, EOMI. No icterus. Conjunctiva pink. ?Ears:  Normal auditory acuity. ?Neck:  Supple ?Throat: Oral cavity and pharynx without inflammation, swelling or lesion.  ?Respiratory: Respirations even and unlabored. Lungs clear to auscultation bilaterally.   No wheezes, crackles, or rhonchi.  ?Cardiovascular: Normal S1, S2. No MRG. Regular rate and rhythm. No peripheral edema, cyanosis or pallor.  ?Gastrointestinal:  Soft, nondistended, nontender. No rebound or guarding. Normal bowel sounds. No appreciable masses or hepatomegaly. ?Rectal: External: External hemorrhoid tag and some visible excoriations; internal: Grade 2 hemorrhoids, no  visible fissure ?Msk:  Symmetrical without gross deformities. Without edema, no deformity or joint abnormality.  ?Neurologic:  Alert and  oriented x4;  grossly normal neurologically.  ?Skin:   Dry and intact without significant lesions or rashes. ?Psychiatric: Demonstrates good judgement and reason without abnormal affect or behaviors. ? ?No recent labs or imaging. ? ?Assessment: ?1.  Grade 2 hemorrhoids: Seen at time of exam today, likely leading to bleeding +/- a fissure which possibly appears at times, very irritating to the patient for the past 16 years ?2.  Rectal bleeding and irritation: With above ? ?Plan: ?1.  Scheduled patient for colonoscopy for further evaluation of bleeding.  At this time she can have further evaluation of  hemorrhoids as well to see if she would be a good candidate for banding in the future.  This was scheduled with Dr. Lorenso Courier in the [facility].  Did provide the patient a detailed list of risks for the procedure and she agrees to proceed. ?2.  Prescribed Hydrocortisone suppositories twice daily x1 to 2 weeks. ?3.  Encouraged patient to start a fiber supplement such as Metamucil, Citrucel or Benefiber on a daily basis. ?4.  Can continue lidocaine for pain relief. ?5.  Discussed wiping technique.  Recommend she use a wet wipe and then dab dry. ?6.  Patient to follow in clinic per recommendations from Dr. Lorenso Courier after time procedure. ? ?Ellouise Newer, PA-C ?[facility] ?12/25/2021, 10:44 AM ? ?Cc: Wendie Agreste, MD  ?

## 2021-12-25 NOTE — Patient Instructions (Addendum)
We have sent the following medications to your pharmacy for you to pick up at your convenience: ?Hydrocortisone suppository twice daily for 7-14 days.  ? ?Use Lidocaine as needed. ? ?Start Benefiber 2 teaspoons in 8 ounces of liquid daily. ? ?You have been scheduled for a colonoscopy. Please follow written instructions given to you at your visit today.  ?Please pick up your prep supplies at the pharmacy within the next 1-3 days. ?If you use inhalers (even only as needed), please bring them with you on the day of your procedure. ? ?If you are age 65 or older, your body mass index should be between 23-30. Your Body mass index is 37.45 kg/m?Marland Kitchen If this is out of the aforementioned range listed, please consider follow up with your Primary Care Provider. ? ?If you are age 79 or younger, your body mass index should be between 19-25. Your Body mass index is 37.45 kg/m?Marland Kitchen If this is out of the aformentioned range listed, please consider follow up with your Primary Care Provider.  ? ?________________________________________________________ ? ?The [facility] providers would like to encourage you to use [facility] to communicate with providers for non-urgent requests or questions.  Due to long hold times on the telephone, sending your provider a message by [facility] may be a faster and more efficient way to get a response.  Please allow 48 business hours for a response.  Please remember that this is for non-urgent requests.  ?_______________________________________________________ ? ? ?

## 2021-12-25 NOTE — Progress Notes (Signed)
I agree with the assessment and plan as outlined by Ms. Lemmon. 

## 2021-12-26 ENCOUNTER — Encounter: Payer: Self-pay | Admitting: Internal Medicine

## 2021-12-31 ENCOUNTER — Ambulatory Visit (AMBULATORY_SURGERY_CENTER): Payer: Medicaid Other | Admitting: Internal Medicine

## 2021-12-31 ENCOUNTER — Encounter: Payer: Self-pay | Admitting: Internal Medicine

## 2021-12-31 VITALS — BP 120/80 | HR 60 | Temp 98.6°F | Resp 14 | Ht 63.0 in | Wt 211.0 lb

## 2021-12-31 DIAGNOSIS — D128 Benign neoplasm of rectum: Secondary | ICD-10-CM

## 2021-12-31 DIAGNOSIS — K641 Second degree hemorrhoids: Secondary | ICD-10-CM

## 2021-12-31 DIAGNOSIS — K635 Polyp of colon: Secondary | ICD-10-CM

## 2021-12-31 DIAGNOSIS — K625 Hemorrhage of anus and rectum: Secondary | ICD-10-CM | POA: Diagnosis not present

## 2021-12-31 DIAGNOSIS — D120 Benign neoplasm of cecum: Secondary | ICD-10-CM

## 2021-12-31 DIAGNOSIS — K621 Rectal polyp: Secondary | ICD-10-CM | POA: Diagnosis not present

## 2021-12-31 MED ORDER — SODIUM CHLORIDE 0.9 % IV SOLN: 500.0000 mL | Freq: Once | INTRAVENOUS | Status: DC

## 2021-12-31 NOTE — Progress Notes (Signed)
? ?GASTROENTEROLOGY PROCEDURE H&P NOTE  ? ?Primary Care Physician: ?Wendie Agreste, MD ? ? ? ?Reason for Procedure:   Rectal bleeding ? ?Plan:    Colonoscopy ? ?Patient is appropriate for endoscopic procedure(s) in the ambulatory ([facility]) setting. ? ?The nature of the procedure, as well as the risks, benefits, and alternatives were carefully and thoroughly reviewed with the patient. Ample time for discussion and questions allowed. The patient understood, was satisfied, and agreed to proceed.  ? ? ? ?HPI: ?Dana Lindsey is a 36 y.o. female who presents for colonoscopy for evaluation of rectal bleeding .  Patient was most recently seen in the Gastroenterology Clinic on 12/25/21.  No interval change in medical history since that appointment. Please refer to that note for full details regarding GI history and clinical presentation.  ? ?Past Medical History:  ?Diagnosis Date  ? Asthma   ? Phreesia 03/06/2020  ? Cholecystolithiasis   ? Ear infection   ? Gastritis   ? Shortness of breath   ? with exertion  ? ? ?Past Surgical History:  ?Procedure Laterality Date  ? CHOLECYSTECTOMY  08/23/2012  ? Procedure: LAPAROSCOPIC CHOLECYSTECTOMY WITH INTRAOPERATIVE CHOLANGIOGRAM;  Surgeon: Edward Jolly, MD;  Location: [facility];  Service: General;  Laterality: N/A;  ? ? ?Prior to Admission medications   ?Medication Sig Start Date End Date Taking? Authorizing Provider  ?albuterol (VENTOLIN HFA) 108 (90 Base) MCG/ACT inhaler Inhale 2 puffs into the lungs every 6 (six) hours as needed for wheezing or shortness of breath. 09/26/21   Wendie Agreste, MD  ?fluticasone Asencion Islam) 50 MCG/ACT nasal spray Place 1 spray into both nostrils daily. 09/26/21   Wendie Agreste, MD  ?fluticasone (FLOVENT HFA) 44 MCG/ACT inhaler Inhale 1 puff into the lungs 2 (two) times daily as needed. For asthma flair or increased cough. 09/26/21   Wendie Agreste, MD  ?hydrocortisone (ANUSOL-HC) 25 MG suppository Place 1 suppository (25 mg total) rectally  every 12 (twelve) hours. 12/25/21   Levin Erp, PA  ?ketoconazole (NIZORAL) 2 % shampoo APPLY 1 APPLICATION TOPICALLY 2 TIMES A WEEK. IST 2-4 WEEKS, THEN ONCE PER WEEK. 09/26/21   Wendie Agreste, MD  ?Lidocaine, Anorectal, 5 % GEL Apply pea-sized amount to the perianal skin up to 3 times per day as needed. 09/26/21   Wendie Agreste, MD  ?meloxicam (MOBIC) 7.5 MG tablet Take 1 tablet (7.5 mg total) by mouth 2 (two) times daily as needed for pain. 03/08/21   Truett Mainland, DO  ?montelukast (SINGULAIR) 10 MG tablet TAKE 1 TABLET BY MOUTH EVERYDAY AT BEDTIME 09/26/21   Wendie Agreste, MD  ?Multiple Vitamins-Minerals (HAIR/SKIN/NAILS/BIOTIN PO) Take 2 tablets by mouth daily.    [provider]  ?Multiple Vitamins-Minerals (MULTIVITAMIN ADULT) CHEW Chew 2 tablets by mouth daily.    [provider]  ?nystatin (MYCOSTATIN/NYSTOP) powder Apply 1 application topically 3 (three) times daily. To anal area. 09/26/21   Wendie Agreste, MD  ? ? ?Current Outpatient Medications  ?Medication Sig Dispense Refill  ? albuterol (VENTOLIN HFA) 108 (90 Base) MCG/ACT inhaler Inhale 2 puffs into the lungs every 6 (six) hours as needed for wheezing or shortness of breath. 18 g 0  ? fluticasone (FLONASE) 50 MCG/ACT nasal spray Place 1 spray into both nostrils daily. 11.1 mL 5  ? fluticasone (FLOVENT HFA) 44 MCG/ACT inhaler Inhale 1 puff into the lungs 2 (two) times daily as needed. For asthma flair or increased cough. 1 each 6  ?  hydrocortisone (ANUSOL-HC) 25 MG suppository Place 1 suppository (25 mg total) rectally every 12 (twelve) hours. 14 suppository 1  ? ketoconazole (NIZORAL) 2 % shampoo APPLY 1 APPLICATION TOPICALLY 2 TIMES A WEEK. IST 2-4 WEEKS, THEN ONCE PER WEEK. 120 mL 3  ? Lidocaine, Anorectal, 5 % GEL Apply pea-sized amount to the perianal skin up to 3 times per day as needed. 30 g 0  ? meloxicam (MOBIC) 7.5 MG tablet Take 1 tablet (7.5 mg total) by mouth 2 (two) times daily as needed for  pain. 30 tablet 3  ? montelukast (SINGULAIR) 10 MG tablet TAKE 1 TABLET BY MOUTH EVERYDAY AT BEDTIME 90 tablet 3  ? Multiple Vitamins-Minerals (HAIR/SKIN/NAILS/BIOTIN PO) Take 2 tablets by mouth daily.    ? Multiple Vitamins-Minerals (MULTIVITAMIN ADULT) CHEW Chew 2 tablets by mouth daily.    ? nystatin (MYCOSTATIN/NYSTOP) powder Apply 1 application topically 3 (three) times daily. To anal area. 15 g 0  ? ?No current facility-administered medications for this visit.  ? ? ?Allergies as of 12/31/2021  ? (No Known Allergies)  ? ? ?Family History  ?Problem Relation Age of Onset  ? Alcoholism Father   ? Asthma Daughter   ? Asthma Son   ? Colon cancer Neg Hx   ? Stomach cancer Neg Hx   ? Esophageal cancer Neg Hx   ? ? ?Social History  ? ?Socioeconomic History  ? Marital status: Married  ?  Spouse name: Not on file  ? Number of children: 4  ? Years of education: Not on file  ? Highest education level: Not on file  ?Occupational History  ? Not on file  ?Tobacco Use  ? Smoking status: Never  ? Smokeless tobacco: Never  ?Vaping Use  ? Vaping Use: Never used  ?Substance and Sexual Activity  ? Alcohol use: No  ?  Comment: rare  ? Drug use: No  ? Sexual activity: Yes  ?Other Topics Concern  ? Not on file  ?Social History Narrative  ? Not on file  ? ?Social Determinants of Health  ? ?Financial Resource Strain: Not on file  ?Food Insecurity: Not on file  ?Transportation Needs: Not on file  ?Physical Activity: Not on file  ?Stress: Not on file  ?Social Connections: Not on file  ?Intimate Partner Violence: Not on file  ? ? ?Physical Exam: ?Vital signs in last 24 hours: ?There were no vitals taken for this visit. ?GEN: NAD ?EYE: Sclerae anicteric ?ENT: MMM ?CV: Non-tachycardic ?Pulm: No increased WOB ?GI: Soft ?NEURO:  Alert & Oriented ? ? ?Christia Reading, MD ?[facility] ? ? ?12/31/2021 10:12 AM ? ?

## 2021-12-31 NOTE — Patient Instructions (Signed)
Please read handouts provided. ?Continue present medications. ?Await pathology results. ?Return to GI clinic at the next available appointment for hemorrhoidal banding. ? ? ?YOU HAD AN ENDOSCOPIC PROCEDURE TODAY AT [facility]:   Refer to the procedure report that was given to you for any specific questions about what was found during the examination.  If the procedure report does not answer your questions, please call your gastroenterologist to clarify.  If you requested that your care partner not be given the details of your procedure findings, then the procedure report has been included in a sealed envelope for you to review at your convenience later. ? ?YOU SHOULD EXPECT: Some feelings of bloating in the abdomen. Passage of more gas than usual.  Walking can help get rid of the air that was put into your GI tract during the procedure and reduce the bloating. If you had a lower endoscopy (such as a colonoscopy or flexible sigmoidoscopy) you may notice spotting of blood in your stool or on the toilet paper. If you underwent a bowel prep for your procedure, you may not have a normal bowel movement for a few days. ? ?Please Note:  You might notice some irritation and congestion in your nose or some drainage.  This is from the oxygen used during your procedure.  There is no need for concern and it should clear up in a day or so. ? ?SYMPTOMS TO REPORT IMMEDIATELY: ? ?Following lower endoscopy (colonoscopy or flexible sigmoidoscopy): ? Excessive amounts of blood in the stool ? Significant tenderness or worsening of abdominal pains ? Swelling of the abdomen that is new, acute ? Fever of 100?F or higher ? ? ?For urgent or emergent issues, a gastroenterologist can be reached at any hour by calling [phone]. ?Do not use MyChart messaging for urgent concerns.  ? ? ?DIET:  We do recommend a small meal at first, but then you may proceed to your regular diet.  Drink plenty of fluids but you should avoid  alcoholic beverages for 24 hours. ? ?ACTIVITY:  You should plan to take it easy for the rest of today and you should NOT DRIVE or use heavy machinery until tomorrow (because of the sedation medicines used during the test).   ? ?FOLLOW UP: ?Our staff will call the number listed on your records 48-72 hours following your procedure to check on you and address any questions or concerns that you may have regarding the information given to you following your procedure. If we do not reach you, we will leave a message.  We will attempt to reach you two times.  During this call, we will ask if you have developed any symptoms of COVID 19. If you develop any symptoms (ie: fever, flu-like symptoms, shortness of breath, cough etc.) before then, please call [phone].  If you test positive for Covid 19 in the 2 weeks post procedure, please call and report this information to [place].   ? ?If any biopsies were taken you will be contacted by phone or by letter within the next 1-3 weeks.  Please call us at [phone] if you have not heard about the biopsies in 3 weeks.  ? ? ?SIGNATURES/CONFIDENTIALITY: ?You and/or your care partner have signed paperwork which will be entered into your electronic medical record.  These signatures attest to the fact that that the information above on your After Visit Summary has been reviewed and is understood.  Full responsibility of the confidentiality of this discharge information lies  with you and/or your care-partner.  ?

## 2021-12-31 NOTE — Op Note (Signed)
[facility] ?Patient Name: Dana Lindsey ?Procedure Date: 12/31/2021 10:46 AM ?MRN: [id number] ?Endoscopist: Sonny Masters "Christia Reading ,  ?Age: 36 ?Referring MD:  ?Date of Birth: Aug 18, 1986 ?Gender: Female ?Account #: [id number] ?Procedure:                Colonoscopy ?Indications:              Rectal bleeding ?Medicines:                Monitored Anesthesia Care ?Procedure:                Pre-Anesthesia Assessment: ?                          - Prior to the procedure, a History and Physical  ?                          was performed, and patient medications and  ?                          allergies were reviewed. The patient's tolerance of  ?                          previous anesthesia was also reviewed. The risks  ?                          and benefits of the procedure and the sedation  ?                          options and risks were discussed with the patient.  ?                          All questions were answered, and informed consent  ?                          was obtained. Prior Anticoagulants: The patient has  ?                          taken no previous anticoagulant or antiplatelet  ?                          agents. ASA Grade Assessment: II - A patient with  ?                          mild systemic disease. After reviewing the risks  ?                          and benefits, the patient was deemed in  ?                          satisfactory condition to undergo the procedure. ?                          After obtaining informed consent, the colonoscope  ?  was passed under direct vision. Throughout the  ?                          procedure, the patient's blood pressure, pulse, and  ?                          oxygen saturations were monitored continuously. The  ?                          CF [id number] #[id number] was introduced through the anus  ?                          and advanced to the the terminal ileum. The  ?                          colonoscopy was performed without  difficulty. The  ?                          patient tolerated the procedure well. The quality  ?                          of the bowel preparation was good. The terminal  ?                          ileum, ileocecal valve, appendiceal orifice, and  ?                          rectum were photographed. ?Scope In: 10:50:46 AM ?Scope Out: 11:07:16 AM ?Scope Withdrawal Time: 0 hours 11 minutes 51 seconds  ?Total Procedure Duration: 0 hours 16 minutes 30 seconds  ?Findings:                 The terminal ileum appeared normal. ?                          A 5 mm polyp was found in the cecum. The polyp was  ?                          sessile. The polyp was removed with a cold snare.  ?                          Resection and retrieval were complete. ?                          A 3 mm polyp was found in the rectum. The polyp was  ?                          sessile. The polyp was removed with a cold snare.  ?                          Resection and retrieval were complete. ?                          Non-bleeding internal  hemorrhoids were found during  ?                          retroflexion. ?Complications:            No immediate complications. ?Estimated Blood Loss:     Estimated blood loss was minimal. ?Impression:               - The examined portion of the ileum was normal. ?                          - One 5 mm polyp in the cecum, removed with a cold  ?                          snare. Resected and retrieved. ?                          - One 3 mm polyp in the rectum, removed with a cold  ?                          snare. Resected and retrieved. ?                          - Non-bleeding internal hemorrhoids. ?Recommendation:           - Discharge patient to home (with escort). ?                          - Await pathology results. ?                          - Return to GI clinic at the next available  ?                          appointment for hemorrhoidal banding. ?                          - The findings and recommendations were  discussed  ?                          with the patient. ?Georgian Co,  ?12/31/2021 11:11:57 AM ?

## 2021-12-31 NOTE — Progress Notes (Signed)
A and O x3. Report to RN. Tolerated MAC anesthesia well. 

## 2021-12-31 NOTE — Progress Notes (Signed)
Called to room to assist during endoscopic procedure.  Patient ID and intended procedure confirmed with present staff. Received instructions for my participation in the procedure from the performing physician.  

## 2022-01-02 ENCOUNTER — Telehealth: Payer: Self-pay

## 2022-01-02 NOTE — Telephone Encounter (Signed)
?  Follow up Call- ? ? ?  12/31/2021  ? 10:18 AM  ?Call back number  ?Post procedure Call Back phone  # [phone]  ?Permission to leave phone message Yes  ?  ? ?Patient questions: ? ?Do you have a fever, pain , or abdominal swelling? No. ?Pain Score  0 * ? ?Have you tolerated food without any problems? Yes.   ? ?Have you been able to return to your normal activities? Yes.   ? ?Do you have any questions about your discharge instructions: ?Diet   No. ?Medications  No. ?Follow up visit  No. ? ?Do you have questions or concerns about your Care? No. ? ?Actions: ?* If pain score is 4 or above: ?No action needed, pain <4. ? ? ?

## 2022-01-02 NOTE — Telephone Encounter (Signed)
Attempted to reach patient for post-procedure f/u call. No answer. Left message. ?

## 2022-01-03 ENCOUNTER — Encounter: Payer: Self-pay | Admitting: Internal Medicine

## 2022-02-13 ENCOUNTER — Ambulatory Visit (INDEPENDENT_AMBULATORY_CARE_PROVIDER_SITE_OTHER): Payer: Medicaid Other | Admitting: Internal Medicine

## 2022-02-13 ENCOUNTER — Encounter: Payer: Self-pay | Admitting: Internal Medicine

## 2022-02-13 VITALS — BP 150/90 | HR 68 | Ht 62.5 in | Wt 214.1 lb

## 2022-02-13 DIAGNOSIS — K641 Second degree hemorrhoids: Secondary | ICD-10-CM

## 2022-02-13 NOTE — Patient Instructions (Signed)
HEMORRHOID BANDING PROCEDURE    FOLLOW-UP CARE   The procedure you have had should have been relatively painless since the banding of the area involved does not have nerve endings and there is no pain sensation.  The rubber band cuts off the blood supply to the hemorrhoid and the band may fall off as soon as 48 hours after the banding (the band may occasionally be seen in the toilet bowl following a bowel movement). You may notice a temporary feeling of fullness in the rectum which should respond adequately to plain Tylenol or Motrin.  Following the banding, avoid strenuous exercise that evening and resume full activity the next day.  A sitz bath (soaking in a warm tub) or bidet is soothing, and can be useful for cleansing the area after bowel movements.     To avoid constipation, take two tablespoons of natural wheat bran, natural oat bran, flax, Benefiber or any over the counter fiber supplement and increase your water intake to 7-8 glasses daily.    Unless you have been prescribed anorectal medication, do not put anything inside your rectum for two weeks: No suppositories, enemas, fingers, etc.  Occasionally, you may have more bleeding than usual after the banding procedure.  This is often from the untreated hemorrhoids rather than the treated one.  Don't be concerned if there is a tablespoon or so of blood.  If there is more blood than this, lie flat with your bottom higher than your head and apply an ice pack to the area. If the bleeding does not stop within a half an hour or if you feel faint, call our office at [phone] or go to the emergency room.  Problems are not common; however, if there is a substantial amount of bleeding, severe pain, chills, fever or difficulty passing urine (very rare) or other problems, you should call us at [phone] or report to the nearest emergency room.  Do not stay seated continuously for more than 2-3 hours for a day or two after the procedure.   Tighten your buttock muscles 10-15 times every two hours and take 10-15 deep breaths every 1-2 hours.  Do not spend more than a few minutes on the toilet if you cannot empty your bowel; instead re-visit the toilet at a later time.   Thank you for entrusting me with your care and for choosing [facility], Dr. Christia Reading

## 2022-02-13 NOTE — Progress Notes (Signed)
PROCEDURE NOTE: The patient presents with symptomatic grade 2  hemorrhoids, requesting rubber band ligation of his/her hemorrhoidal disease.  All risks, benefits and alternative forms of therapy were described and informed consent was obtained.  In the Left Lateral Decubitus position anoscopic examination revealed grade 2 hemorrhoids in the posterior, left anterior, right anterior position(s).  The anorectum was pre-medicated with Recticare and nitroglycerin The decision was made to band the posterior internal hemorrhoid, and the [place] was used to perform band ligation without complication.  Digital anorectal examination was then performed to assure proper positioning of the band, and to adjust the banded tissue as required.  The patient was discharged home without pain or other issues.  Dietary and behavioral recommendations were given and along with follow-up instructions.     The following adjunctive treatments were recommended: Drinking 8 cups of water per day, walking 30 minutes per day, eating at least 25 grams of fiber per day  The patient will return in 4 weeks for  follow-up and possible additional banding as required. No complications were encountered and the patient tolerated the procedure well.

## 2022-02-27 ENCOUNTER — Ambulatory Visit: Payer: Medicaid Other | Admitting: Family Medicine

## 2022-03-19 ENCOUNTER — Encounter: Payer: Self-pay | Admitting: Internal Medicine

## 2022-03-19 ENCOUNTER — Ambulatory Visit (INDEPENDENT_AMBULATORY_CARE_PROVIDER_SITE_OTHER): Payer: Medicaid Other | Admitting: Internal Medicine

## 2022-03-19 VITALS — BP 132/80 | HR 71 | Ht 63.0 in | Wt 219.0 lb

## 2022-03-19 DIAGNOSIS — K641 Second degree hemorrhoids: Secondary | ICD-10-CM

## 2022-03-19 NOTE — Progress Notes (Signed)
PROCEDURE NOTE: The patient presents with symptomatic grade 2 hemorrhoids, requesting rubber band ligation of his/her hemorrhoidal disease.  All risks, benefits and alternative forms of therapy were described and informed consent was obtained.  The anorectum was pre-medicated with nitroglycerin and Recticare. The decision was made to band the right anterior internal hemorrhoid, and the [place] was used to perform band ligation without complication.  Digital anorectal examination was then performed to assure proper positioning of the band, and to adjust the banded tissue as required.  The patient was discharged home without pain or other issues.  Dietary and behavioral recommendations were given and along with follow-up instructions.    The patient will return in 4 weeks for  follow-up and possible additional banding as required. No complications were encountered and the patient tolerated the procedure well.

## 2022-03-19 NOTE — Patient Instructions (Signed)
If you are age 38 or older, your body mass index should be between 23-30. Your Body mass index is 38.79 kg/m. If this is out of the aforementioned range listed, please consider follow up with your Primary Care Provider.  If you are age 48 or younger, your body mass index should be between 19-25. Your Body mass index is 38.79 kg/m. If this is out of the aformentioned range listed, please consider follow up with your Primary Care Provider.   ________________________________________________________  The [place] [facility] providers would like to encourage you to use [facility] to communicate with providers for non-urgent requests or questions.  Due to long hold times on the telephone, sending your provider a message by [facility] may be a faster and more efficient way to get a response.  Please allow 48 business hours for a response.  Please remember that this is for non-urgent requests.  _______________________________________________________  Thayer Jew PROCEDURE    FOLLOW-UP CARE   The procedure you have had should have been relatively painless since the banding of the area involved does not have nerve endings and there is no pain sensation.  The rubber band cuts off the blood supply to the hemorrhoid and the band may fall off as soon as 48 hours after the banding (the band may occasionally be seen in the toilet bowl following a bowel movement). You may notice a temporary feeling of fullness in the rectum which should respond adequately to plain Tylenol or Motrin.  Following the banding, avoid strenuous exercise that evening and resume full activity the next day.  A sitz bath (soaking in a warm tub) or bidet is soothing, and can be useful for cleansing the area after bowel movements.     To avoid constipation, take two tablespoons of natural wheat bran, natural oat bran, flax, Benefiber or any over the counter fiber supplement and increase your water intake to 7-8 glasses daily.    Unless you  have been prescribed anorectal medication, do not put anything inside your rectum for two weeks: No suppositories, enemas, fingers, etc.  Occasionally, you may have more bleeding than usual after the banding procedure.  This is often from the untreated hemorrhoids rather than the treated one.  Don't be concerned if there is a tablespoon or so of blood.  If there is more blood than this, lie flat with your bottom higher than your head and apply an ice pack to the area. If the bleeding does not stop within a half an hour or if you feel faint, call our office at [phone] or go to the emergency room.  Problems are not common; however, if there is a substantial amount of bleeding, severe pain, chills, fever or difficulty passing urine (very rare) or other problems, you should call us at [phone] or report to the nearest emergency room.  Do not stay seated continuously for more than 2-3 hours for a day or two after the procedure.  Tighten your buttock muscles 10-15 times every two hours and take 10-15 deep breaths every 1-2 hours.  Do not spend more than a few minutes on the toilet if you cannot empty your bowel; instead re-visit the toilet at a later time.   It was a pleasure to see you today!  Thank you for trusting me with your gastrointestinal care!

## 2022-03-22 HISTORY — PX: LIPOSUCTION: SHX10

## 2022-05-05 ENCOUNTER — Encounter: Payer: Medicaid Other | Admitting: Internal Medicine

## 2022-06-16 ENCOUNTER — Ambulatory Visit (INDEPENDENT_AMBULATORY_CARE_PROVIDER_SITE_OTHER): Payer: Medicaid Other | Admitting: Family Medicine

## 2022-06-16 ENCOUNTER — Encounter: Payer: Self-pay | Admitting: Family Medicine

## 2022-06-16 VITALS — BP 122/78 | HR 73 | Temp 98.7°F | Ht 63.0 in | Wt 201.4 lb

## 2022-06-16 DIAGNOSIS — L219 Seborrheic dermatitis, unspecified: Secondary | ICD-10-CM | POA: Diagnosis not present

## 2022-06-16 DIAGNOSIS — R222 Localized swelling, mass and lump, trunk: Secondary | ICD-10-CM

## 2022-06-16 DIAGNOSIS — K644 Residual hemorrhoidal skin tags: Secondary | ICD-10-CM

## 2022-06-16 DIAGNOSIS — J4520 Mild intermittent asthma, uncomplicated: Secondary | ICD-10-CM

## 2022-06-16 DIAGNOSIS — Z9889 Other specified postprocedural states: Secondary | ICD-10-CM | POA: Diagnosis not present

## 2022-06-16 DIAGNOSIS — Z23 Encounter for immunization: Secondary | ICD-10-CM

## 2022-06-16 MED ORDER — FLUTICASONE PROPIONATE 50 MCG/ACT NA SUSP: 1.0000 | Freq: Every day | NASAL | 11 refills | Status: AC

## 2022-06-16 MED ORDER — ALBUTEROL SULFATE HFA 108 (90 BASE) MCG/ACT IN AERS: 2.0000 | INHALATION_SPRAY | Freq: Four times a day (QID) | RESPIRATORY_TRACT | 0 refills | Status: DC | PRN

## 2022-06-16 MED ORDER — KETOCONAZOLE 2 % EX SHAM: MEDICATED_SHAMPOO | CUTANEOUS | 5 refills | Status: DC

## 2022-06-16 MED ORDER — FLUTICASONE PROPIONATE HFA 44 MCG/ACT IN AERO: 1.0000 | INHALATION_SPRAY | Freq: Two times a day (BID) | RESPIRATORY_TRACT | 6 refills | Status: DC | PRN

## 2022-06-16 NOTE — Patient Instructions (Addendum)
Flovent is maintenance or preventative asthma inhaler that can be used daily. Albuterol is rescue inhaler - only as needed.   Restart ketoconazole shampoo twice per week for the next 2 weeks, then once per week for maintenance  No other med changes today.  I will refer you to general or plastic surgery to evaluate the wound and firm areas further.  If any acute worsening, redness, pain, fever or discharge be seen right away.

## 2022-06-16 NOTE — Progress Notes (Unsigned)
Subjective:  Patient ID: Dana Lindsey, female    DOB: 1986/02/08  Age: 36 y.o. MRN: [id number]  CC:  Chief Complaint  Patient presents with   Asthma    Pt states all is well    HPI Dana Lindsey presents for   Mild intermittent asthma with allergies.  Last discussed in March.  Accompanied with allergic rhinitis.  Improved at that time with restart of Flonase, and Flovent.  Albuterol as needed.  Singulair for allergies, asthma. Only using flovent as needed or the albuterol.  doing well, no recent need for the albuterol.  Rare need for singulair, flonase more often helps. Working well.   Seborrheic dermatitis of scalp Improving at her March visit, intermittent dosing of ketoconazole shampoo planned for maintenance. Had been working ok. Used shampoo for coloring, some recurrence of seb derm. Last used ketoconazole about a month ago.   Hemorrhoids Symptomatic care discussed at last visit, plan for follow-up with gastroenterology, evaluated by Dr. Lorenso Courier, grade 2 hemorrhoids status post banding, with repeat banding July 12. Doing better, follow up appointment for last banding tomorrow.   S/p plastic surgery: Concern noted at end of visit. Tummy tuck in July 21st- had excess skin removed and muscles tightened. Performed in [place]. Had initial drain on left. \ Drain fell out. Reopened in middle for 4 days to drain, then closed. 2nd repeat opening under anesthesia in middle for infection - day 8 or 9.  Has not seen anyone locally, or difficulty since.  No recent open areas. Slight discomfort. Firm along mid to R side of wound. Increasing firmness in this area past few weeks. No discharge. No fever.   History There are no problems to display for this patient.  Past Medical History:  Diagnosis Date   Asthma    [employer]reesia 03/06/2020   Cholecystolithiasis    Ear infection    Gastritis    Hyperplastic colon polyp    Shortness of breath    with exertion   Past Surgical History:   Procedure Laterality Date   CHOLECYSTECTOMY  08/23/2012   Procedure: LAPAROSCOPIC CHOLECYSTECTOMY WITH INTRAOPERATIVE CHOLANGIOGRAM;  Surgeon: Edward Jolly, MD;  Location: [facility] OR;  Service: General;  Laterality: N/A;   COLONOSCOPY     No Known Allergies Prior to Admission medications   Medication Sig Start Date End Date Taking? Authorizing Provider  albuterol (VENTOLIN HFA) 108 (90 Base) MCG/ACT inhaler Inhale 2 puffs into the lungs every 6 (six) hours as needed for wheezing or shortness of breath. 09/26/21  Yes Wendie Agreste, MD  fluticasone (FLONASE) 50 MCG/ACT nasal spray Place 1 spray into both nostrils daily. 09/26/21  Yes Wendie Agreste, MD  fluticasone (FLOVENT HFA) 44 MCG/ACT inhaler Inhale 1 puff into the lungs 2 (two) times daily as needed. For asthma flair or increased cough. 09/26/21  Yes Wendie Agreste, MD  ketoconazole (NIZORAL) 2 % shampoo APPLY 1 APPLICATION TOPICALLY 2 TIMES A WEEK. IST 2-4 WEEKS, THEN ONCE PER WEEK. 09/26/21  Yes Wendie Agreste, MD  Multiple Vitamins-Minerals (HAIR/SKIN/NAILS/BIOTIN PO) Take 2 tablets by mouth daily.   Yes [provider]  Multiple Vitamins-Minerals (MULTIVITAMIN ADULT) CHEW Chew 2 tablets by mouth daily.   Yes [provider]  nystatin (MYCOSTATIN/NYSTOP) powder Apply 1 application topically 3 (three) times daily. To anal area. 09/26/21  Yes Wendie Agreste, MD  Lidocaine, Anorectal, 5 % GEL Apply pea-sized amount to the perianal skin up to 3 times per day as needed. Patient  not taking: Reported on 06/16/2022 09/26/21   Wendie Agreste, MD  meloxicam (MOBIC) 7.5 MG tablet Take 1 tablet (7.5 mg total) by mouth 2 (two) times daily as needed for pain. Patient not taking: Reported on 06/16/2022 03/08/21   Truett Mainland, DO   Social History   Socioeconomic History   Marital status: Married    Spouse name: Not on file   Number of children: 4   Years of education: Not on file   Highest education level: Not on  file  Occupational History   Not on file  Tobacco Use   Smoking status: Never   Smokeless tobacco: Never  Vaping Use   Vaping Use: Never used  Substance and Sexual Activity   Alcohol use: No    Comment: rare   Drug use: No   Sexual activity: Yes    Birth control/protection: I.U.D.  Other Topics Concern   Not on file  Social History Narrative   Not on file   Social Determinants of Health   Financial Resource Strain: Low Risk  (12/02/2018)   Overall Financial Resource Strain (CARDIA)    Difficulty of Paying Living Expenses: Not hard at all  Food Insecurity: No Food Insecurity (12/02/2018)   Hunger Vital Sign    Worried About Running Out of Food in the Last Year: Never true    [place] in the Last Year: Never true  Transportation Needs: Unknown (12/02/2018)   PRAPARE - Hydrologist (Medical): No    Lack of Transportation (Non-Medical): Not on file  Physical Activity: Not on file  Stress: Stress Concern Present (12/02/2018)   [place]    Feeling of Stress : To some extent  Social Connections: Not on file  Intimate Partner Violence: Not At Risk (12/02/2018)   Humiliation, Afraid, Rape, and Kick questionnaire    Fear of Current or Ex-Partner: No    Emotionally Abused: No    Physically Abused: No    Sexually Abused: No    Review of Systems Per HPI.   Objective:   Vitals:   06/16/22 1032  BP: 122/78  Pulse: 73  Temp: 98.7 F (37.1 C)  SpO2: 97%  Weight: 201 lb 6.4 oz (91.4 kg)  Height: '5\' 3"'$  (1.6 m)   Physical Exam Vitals reviewed.  Constitutional:      Appearance: Normal appearance. She is well-developed.  HENT:     Head: Normocephalic and atraumatic.  Eyes:     Conjunctiva/sclera: Conjunctivae normal.     Pupils: Pupils are equal, round, and reactive to light.  Neck:     Vascular: No carotid bruit.  Cardiovascular:     Rate and Rhythm: Normal rate and  regular rhythm.     Heart sounds: Normal heart sounds.  Pulmonary:     Effort: Pulmonary effort is normal.     Breath sounds: Normal breath sounds.  Abdominal:     Palpations: Abdomen is soft. There is no pulsatile mass.     Tenderness: There is no abdominal tenderness.     Comments: Assistant Levada Dy present for exam. Well-healed low transverse scar, with nodular. Firm areas above mid to R side of wound. No disharge or erythema, no rash or apparent acute skin changes.   Musculoskeletal:     Right lower leg: No edema.     Left lower leg: No edema.  Skin:    General: Skin is warm and dry.  Neurological:     Mental Status: She is alert and oriented to person, place, and time.  Psychiatric:        Mood and Affect: Mood normal.        Behavior: Behavior normal.      Assessment & Plan:  Dana Lindsey is a 36 y.o. female . Mild intermittent asthma without complication - Plan: fluticasone (FLONASE) 50 MCG/ACT nasal spray, fluticasone (FLOVENT HFA) 44 MCG/ACT inhaler, albuterol (VENTOLIN HFA) 108 (90 Base) MCG/ACT inhaler  Need for influenza vaccination - Plan: Flu Vaccine QUAD 6+ mos PF IM (Fluarix Quad PF)  Seborrheic dermatitis of scalp - Plan: ketoconazole (NIZORAL) 2 % shampoo  External hemorrhoids  H/O abdominoplasty - Plan: Ambulatory referral to General Surgery  Abdominal wall mass - Plan: Ambulatory referral to General Surgery   No orders of the defined types were placed in this encounter.  Patient Instructions  Flovent is maintenance or preventative asthma inhlaer that can be used daily. Albuterol is rescue inhaler - only as needed.     Signed,   Merri Ray, MD [place] 06/16/22 11:04 AM

## 2022-06-17 ENCOUNTER — Encounter: Payer: Self-pay | Admitting: Internal Medicine

## 2022-06-17 ENCOUNTER — Ambulatory Visit (INDEPENDENT_AMBULATORY_CARE_PROVIDER_SITE_OTHER): Payer: Medicaid Other | Admitting: Internal Medicine

## 2022-06-17 ENCOUNTER — Encounter: Payer: Self-pay | Admitting: Family Medicine

## 2022-06-17 VITALS — BP 120/80 | HR 88 | Ht 63.0 in | Wt 202.0 lb

## 2022-06-17 DIAGNOSIS — K641 Second degree hemorrhoids: Secondary | ICD-10-CM

## 2022-06-17 DIAGNOSIS — K602 Anal fissure, unspecified: Secondary | ICD-10-CM

## 2022-06-17 MED ORDER — AMBULATORY NON FORMULARY MEDICATION: 0 refills | Status: DC

## 2022-06-17 NOTE — Progress Notes (Signed)
   Chief Complaint: Hemorrhoids  HPI : 36 year old female with history of asthma presents for follow up of hemorrhoids  Interval History: Patient recently had a tummy tuck surgery on 03/2022. Afterwards she experienced severe constipation and had a hard time passing a large stool. Eventually she did pass this stool but felt like there was a tearing sensation in her rectal area. She did see a large amount of blood at that time, which has since resolved. Since that large hard stool, she has continued to pass soft stools. Each time that she passes the stool, she feels stinging sensation. She does feel like her prior hemorrhoidal banding sessions on 6/8 and 7/12 helped, but she is worried that she has lost some progress from her prior banding sensations after her post-surgical constipation issues.    Current Outpatient Medications  Medication Sig Dispense Refill   albuterol (VENTOLIN HFA) 108 (90 Base) MCG/ACT inhaler Inhale 2 puffs into the lungs every 6 (six) hours as needed for wheezing or shortness of breath. 18 g 0   fluticasone (FLONASE) 50 MCG/ACT nasal spray Place 1 spray into both nostrils daily. 11.1 mL 11   fluticasone (FLOVENT HFA) 44 MCG/ACT inhaler Inhale 1 puff into the lungs 2 (two) times daily as needed. For asthma flair or increased cough. 1 each 6   ketoconazole (NIZORAL) 2 % shampoo APPLY 1 APPLICATION TOPICALLY 2 TIMES A WEEK. IST 2-4 WEEKS, THEN ONCE PER WEEK. 120 mL 5   Multiple Vitamins-Minerals (HAIR/SKIN/NAILS/BIOTIN PO) Take 2 tablets by mouth daily.     Multiple Vitamins-Minerals (MULTIVITAMIN ADULT) CHEW Chew 2 tablets by mouth daily.     nystatin (MYCOSTATIN/NYSTOP) powder Apply 1 application topically 3 (three) times daily. To anal area. (Patient not taking: Reported on 06/17/2022) 15 g 0   No current facility-administered medications for this visit.    Physical Exam: BP 120/80   Pulse 88   Ht '5\' 3"'$  (1.6 m)   Wt 202 lb (91.6 kg)   SpO2 98%   BMI 35.78 kg/m   Constitutional: Pleasant,well-developed, female in no acute distress. HEENT: Normocephalic and atraumatic. Conjunctivae are normal. No scleral icterus. Cardiovascular: Normal rate Pulmonary/chest: Effort normal  Rectal: Posterior anal fissure noted Neurological: Alert and oriented to person place and time. Skin: Skin is warm and dry. No rashes noted. Psychiatric: Normal mood and affect. Behavior is normal.  Labs 06/2021: CBC and TSH unremarkable  Colonoscopy 12/31/21: - The examined portion of the ileum was normal. - One 5 mm polyp in the cecum, removed with a cold snare. Resected and retrieved. - One 3 mm polyp in the rectum, removed with a cold snare. Resected and retrieved. - Non-bleeding internal hemorrhoids. Path: Surgical [P], colon, cecum, rectum, polyp (2) COMPATIBLE WITH HYPERPLASTIC POLYPS NEGATIVE FOR DYSPLASIA AND CARCINOMA  ASSESSMENT AND PLAN: Anal fissure Hemorrhoids Patient presents for follow up after a severe episode of constipation in 03/2022 after a recent tummy tuck surgery. On rectal exam today she was noted to have a posterior anal fissure. We will focus on the anal fissure treatment for now and then re-evaluate in 2 months to see if we can do her 3rd session of hemorrhoidal banding.  - Keep stools soft - Diltiazem 2% with Lidocaine 5% - Using your index finger, apply a small amount of medication inside the rectum up to your first knuckle/joint three times daily x 8 weeks.  Christia Reading, MD

## 2022-06-17 NOTE — Patient Instructions (Addendum)
_______________________________________________________  If you are age 38 or older, your body mass index should be between 23-30. Your Body mass index is 35.78 kg/m. If this is out of the aforementioned range listed, please consider follow up with your Primary Care Provider.  If you are age 31 or younger, your body mass index should be between 19-25. Your Body mass index is 35.78 kg/m. If this is out of the aformentioned range listed, please consider follow up with your Primary Care Provider.   ________________________________________________________  The [place] [facility] providers would like to encourage you to use [facility] to communicate with providers for non-urgent requests or questions.  Due to long hold times on the telephone, sending your provider a message by [facility] may be a faster and more efficient way to get a response.  Please allow 48 business hours for a response.  Please remember that this is for non-urgent requests.  _______________________________________________________  We have sent the following medications to your pharmacy for you to pick up at your convenience: We have sent a prescription for Diltiazem 2% to [facility]. You should apply a pea size amount to your rectum three times daily x 6-8 weeks.  [facility]'s information is below: Address: [address] [place], [place] [address]  [phone]  *Please DO NOT go directly from our office to pick up this medication! Give the pharmacy 1 day to process the prescription as this is compounded and takes time to make.  ___________________________________________________________  We have scheduled you for a 3rd banding appointment with Dr. Lorenso Courier on Monday, 12-11 at 9:30 am.    Thank you for entrusting me with your care and for choosing [facility], Dr. Christia Reading

## 2022-08-18 ENCOUNTER — Encounter: Payer: Self-pay | Admitting: Internal Medicine

## 2022-08-18 ENCOUNTER — Ambulatory Visit (INDEPENDENT_AMBULATORY_CARE_PROVIDER_SITE_OTHER): Payer: Medicaid Other | Admitting: Internal Medicine

## 2022-08-18 VITALS — BP 126/80 | HR 85 | Ht 63.0 in | Wt 203.0 lb

## 2022-08-18 DIAGNOSIS — K641 Second degree hemorrhoids: Secondary | ICD-10-CM

## 2022-08-18 NOTE — Patient Instructions (Addendum)
If you are age 38 or older, your body mass index should be between 23-30. Your Body mass index is 35.96 kg/m. If this is out of the aforementioned range listed, please consider follow up with your Primary Care Provider.  If you are age 68 or younger, your body mass index should be between 19-25. Your Body mass index is 35.96 kg/m. If this is out of the aformentioned range listed, please consider follow up with your Primary Care Provider.   ________________________________________________________  The [facility] providers would like to encourage you to use [facility] to communicate with providers for non-urgent requests or questions.  Due to long hold times on the telephone, sending your provider a message by [facility] may be a faster and more efficient way to get a response.  Please allow 48 business hours for a response.  Please remember that this is for non-urgent requests.   HEMORRHOID BANDING PROCEDURE    FOLLOW-UP CARE   The procedure you have had should have been relatively painless since the banding of the area involved does not have nerve endings and there is no pain sensation.  The rubber band cuts off the blood supply to the hemorrhoid and the band may fall off as soon as 48 hours after the banding (the band may occasionally be seen in the toilet bowl following a bowel movement). You may notice a temporary feeling of fullness in the rectum which should respond adequately to plain Tylenol or Motrin.  Following the banding, avoid strenuous exercise that evening and resume full activity the next day.  A sitz bath (soaking in a warm tub) or bidet is soothing, and can be useful for cleansing the area after bowel movements.     To avoid constipation, take two tablespoons of natural wheat bran, natural oat bran, flax, Benefiber or any over the counter fiber supplement and increase your water intake to 7-8 glasses daily.    Unless you have been prescribed anorectal medication, do not put  anything inside your rectum for two weeks: No suppositories, enemas, fingers, etc.  Occasionally, you may have more bleeding than usual after the banding procedure.  This is often from the untreated hemorrhoids rather than the treated one.  Don't be concerned if there is a tablespoon or so of blood.  If there is more blood than this, lie flat with your bottom higher than your head and apply an ice pack to the area. If the bleeding does not stop within a half an hour or if you feel faint, call our office at [phone] or go to the emergency room.  Problems are not common; however, if there is a substantial amount of bleeding, severe pain, chills, fever or difficulty passing urine (very rare) or other problems, you should call us at [phone] or report to the nearest emergency room.  Do not stay seated continuously for more than 2-3 hours for a day or two after the procedure.  Tighten your buttock muscles 10-15 times every two hours and take 10-15 deep breaths every 1-2 hours.  Do not spend more than a few minutes on the toilet if you cannot empty your bowel; instead re-visit the toilet at a later time.  Thank you for entrusting me with your care and choosing [facility].  Dr. Lorenso Courier

## 2022-08-18 NOTE — Progress Notes (Signed)
PROCEDURE NOTE: The patient presents with symptomatic grade 2 hemorrhoids, requesting rubber band ligation of his/her hemorrhoidal disease.  All risks, benefits and alternative forms of therapy were described and informed consent was obtained.  In the Left Lateral Decubitus position the anorectum was pre-medicated with Recticare and nitroglycerin The decision was made to band the left anterior internal hemorrhoid, and the [place] was used to perform band ligation without complication.  Digital anorectal examination was then performed to assure proper positioning of the band, and to adjust the banded tissue as required.  The patient was discharged home without pain or other issues.  Dietary and behavioral recommendations were given and along with follow-up instructions.     The following adjunctive treatments were recommended: Hydration, fiber, ensuring that stools are soft  The patient will return PRN for  follow-up No complications were encountered and the patient tolerated the procedure well.

## 2022-11-18 ENCOUNTER — Encounter: Payer: Self-pay | Admitting: Family Medicine

## 2022-11-25 ENCOUNTER — Other Ambulatory Visit: Payer: Self-pay | Admitting: Family Medicine

## 2022-11-25 DIAGNOSIS — R062 Wheezing: Secondary | ICD-10-CM

## 2022-11-25 DIAGNOSIS — J22 Unspecified acute lower respiratory infection: Secondary | ICD-10-CM

## 2023-03-26 ENCOUNTER — Encounter: Payer: Self-pay | Admitting: Family Medicine

## 2023-03-26 ENCOUNTER — Ambulatory Visit (INDEPENDENT_AMBULATORY_CARE_PROVIDER_SITE_OTHER): Payer: Medicaid Other | Admitting: Family Medicine

## 2023-03-26 VITALS — BP 124/72 | HR 79 | Temp 98.6°F | Ht 63.0 in | Wt 206.7 lb

## 2023-03-26 DIAGNOSIS — R238 Other skin changes: Secondary | ICD-10-CM | POA: Diagnosis not present

## 2023-03-26 DIAGNOSIS — I83811 Varicose veins of right lower extremities with pain: Secondary | ICD-10-CM

## 2023-03-26 DIAGNOSIS — J3489 Other specified disorders of nose and nasal sinuses: Secondary | ICD-10-CM | POA: Diagnosis not present

## 2023-03-26 DIAGNOSIS — L219 Seborrheic dermatitis, unspecified: Secondary | ICD-10-CM

## 2023-03-26 NOTE — Progress Notes (Signed)
Subjective:  Patient ID: Dana Lindsey, female    DOB: [birth year]  Age: 37 y.o. MRN: [id number]  CC:  Chief Complaint  Patient presents with   Hair/Scalp Problem    Pt notes dry scalp and face has tried ketoconazole shampoo notes it helped for a little and now doesn't help at all, itching on the scalp and around the ears    Nose Problem    Notes her sinuses are very dry, will get some bleeding from dryness.    Varicose Veins    Pt notes pain in her leg behind her Rt knee has one vein that seems to bother her intermittently     HPI Dana Lindsey presents for   Seborrheic dermatitis of scalp Last discussed in October [place], treatment and then maintenance regimen discussed with ketoconazole shampoo.  Initially recommended shampoo twice per week for few weeks then once per week for maintenance.  Noted some improvement initially with use of shampoo 3 times per week in the past, but with use in October - 3 times per week, then 2 times per week, not helping. Not itchy - just dry. Off meds for past 2 months. Dry areas of scalp and near ears. Flaky near ears.  Shampoo - multiple types. Washing hair every other day - conditioner to tips of hair  Allergic rhinitis with history of asthma Treated with Flonase, Flovent previously as well as Singulair for her asthma, albuterol as needed.  Well-controlled in October of last year.  Does report some increasing dryness in her nasal passages, sinus with occasional bleeding. Infrequent use/need of flovent - not needed in past month. No recent need for albuterol No recent use of flonase.  Dryness in nasal passage with flakes of skin at time. Specks of blood recently only with blowing nose.  Tx: vaseline.   Symptomatic varicose vein History of varicose veins, specifically one behind her right knee with some intermittent discomfort. Present for years, intermittent soreness, possibly after prolonged standing. No vein specialist.    History There are  no problems to display for this patient.  Past Medical History:  Diagnosis Date   Asthma    [employer] 03/06/2020   Cholecystolithiasis    Ear infection    Gastritis    Hyperplastic colon polyp    Shortness of breath    with exertion   Past Surgical History:  Procedure Laterality Date   CHOLECYSTECTOMY  08/23/2012   Procedure: LAPAROSCOPIC CHOLECYSTECTOMY WITH INTRAOPERATIVE CHOLANGIOGRAM;  Surgeon: Dana Saa, MD;  Location: [facility] OR;  Service: General;  Laterality: N/A;   COLONOSCOPY     LIPOSUCTION  03/22/2022   No Known Allergies Prior to Admission medications   Medication Sig Start Date End Date Taking? Authorizing Provider  albuterol (VENTOLIN HFA) 108 (90 Base) MCG/ACT inhaler Inhale 2 puffs into the lungs every 6 (six) hours as needed for wheezing or shortness of breath. 06/16/22  Yes Shade Flood, MD  AMBULATORY NON FORMULARY MEDICATION 2% Diltiazem gel with 5% lidocaine  Apply a pea sized amount into your rectum three times daily for 6 weeks Dispense 30 GM zero refill 06/17/22  Yes Imogene Burn, MD  fluticasone (FLONASE) 50 MCG/ACT nasal spray Place 1 spray into both nostrils daily. 06/16/22  Yes Shade Flood, MD  fluticasone (FLOVENT HFA) 44 MCG/ACT inhaler Inhale 1 puff into the lungs 2 (two) times daily as needed. For asthma flair or increased cough. 06/16/22  Yes Shade Flood, MD  ketoconazole (NIZORAL) 2 %  shampoo APPLY 1 APPLICATION TOPICALLY 2 TIMES A WEEK. IST 2-4 WEEKS, THEN ONCE PER WEEK. 06/16/22  Yes Shade Flood, MD  Multiple Vitamins-Minerals (HAIR/SKIN/NAILS/BIOTIN PO) Take 2 tablets by mouth daily.   Yes [provider]  Multiple Vitamins-Minerals (MULTIVITAMIN ADULT) CHEW Chew 2 tablets by mouth daily.   Yes [provider]  nystatin (MYCOSTATIN/NYSTOP) powder Apply 1 application topically 3 (three) times daily. To anal area. 09/26/21  Yes Shade Flood, MD   Social History   Socioeconomic History   Marital  status: Married    Spouse name: Not on file   Number of children: 4   Years of education: Not on file   Highest education level: Not on file  Occupational History   Not on file  Tobacco Use   Smoking status: Never   Smokeless tobacco: Never  Vaping Use   Vaping status: Never Used  Substance and Sexual Activity   Alcohol use: No    Comment: rare   Drug use: No   Sexual activity: Yes    Birth control/protection: I.U.D.  Other Topics Concern   Not on file  Social History Narrative   Not on file   Social Determinants of Health   Financial Resource Strain: Low Risk  (12/02/2018)   Overall Financial Resource Strain (CARDIA)    Difficulty of Paying Living Expenses: Not hard at all  Food Insecurity: No Food Insecurity (12/02/2018)   Hunger Vital Sign    Worried About Running Out of Food in the Last Year: Never true    Ran Out of Food in the Last Year: Never true  Transportation Needs: Unknown (12/02/2018)   PRAPARE - Administrator, Civil Service (Medical): No    Lack of Transportation (Non-Medical): Not on file  Physical Activity: Not on file  Stress: Stress Concern Present (12/02/2018)   [employer] of Occupational Health - Occupational Stress Questionnaire    Feeling of Stress : To some extent  Social Connections: Not on file  Intimate Partner Violence: Not At Risk (12/02/2018)   Humiliation, Afraid, Rape, and Kick questionnaire    Fear of Current or Ex-Partner: No    Emotionally Abused: No    Physically Abused: No    Sexually Abused: No    Review of Systems Per HPI  Objective:   Vitals:   03/26/23 1332  BP: 124/72  Pulse: 79  Temp: 98.6 F (37 C)  TempSrc: Temporal  SpO2: 97%  Weight: 206 lb 11.2 oz (93.8 kg)  Height: 5\' 3"  (1.6 m)     Physical Exam Vitals reviewed.  Constitutional:      Appearance: Normal appearance. She is well-developed.  HENT:     Head: Normocephalic and atraumatic.     Comments: Scalp, few various areas of dry skin  with some flaking on dorsal scalp, few patches and temporal scalp just above ear, few areas of dry skin on upper pinna.  No appreciable facial involvement.    Nose: Nose normal.     Comments: No discharge, bleeding or wounds appreciated. Eyes:     Conjunctiva/sclera: Conjunctivae normal.     Pupils: Pupils are equal, round, and reactive to light.  Neck:     Vascular: No carotid bruit.  Cardiovascular:     Rate and Rhythm: Normal rate and regular rhythm.     Heart sounds: Normal heart sounds.  Pulmonary:     Effort: Pulmonary effort is normal.     Breath sounds: Normal breath sounds.  Abdominal:  Palpations: Abdomen is soft. There is no pulsatile mass.     Tenderness: There is no abdominal tenderness.  Musculoskeletal:     Right lower leg: No edema.     Left lower leg: No edema.  Skin:    General: Skin is warm and dry.     Comments: Right knee, popliteal space with small area of prominent veins, varicose veins, nontender on exam.  Calves nontender.  Neurological:     Mental Status: She is alert and oriented to person, place, and time.  Psychiatric:        Mood and Affect: Mood normal.        Behavior: Behavior normal.        Assessment & Plan:  Dana Lindsey is a 37 y.o. female . Seborrheic dermatitis of scalp - Plan: Ambulatory referral to Dermatology Dry scalp - Plan: Ambulatory referral to Dermatology  -Still suspect component of seborrheic dermatitis with previous improvement on ketoconazole, but on further discussion today she did not notice any improvement with 3 times per week dosing of ketoconazole.  Will refer to dermatology for evaluation and discussion of treatment changes.  No new meds for now.  Denies significant itching, hold on steroid treatment currently.  Dry nose  -Saline nasal spray, humidifier discussed to help with dry nasal passages but no concerns seen on exam, RTC precautions given.  Has Flonase if needed for allergic symptoms.  Varicose veins  of right lower extremity with pain - Plan: Ambulatory referral to Vascular Surgery  -Intermittent pain of vein behind right knee.  Refer to vascular specialist to discuss treatment options, reassuring exam currently.  Right knee, referred to vascular.  No orders of the defined types were placed in this encounter.  Patient Instructions  I will refer you to dermatologist for scalp issues.  Try using saline nasal spray few times per day and use of humidifier in bedroom may be helpful.  I will refer you to vein specialist for the painful varicose vein.   Return to the clinic or go to the nearest emergency room if any of your symptoms worsen or new symptoms occur.     Signed,   Meredith Staggers, MD [facility], [facility] 03/26/23 1:54 PM

## 2023-03-26 NOTE — Patient Instructions (Addendum)
I will refer you to dermatologist for scalp issues.  Try using saline nasal spray few times per day and use of humidifier in bedroom may be helpful.  I will refer you to vein specialist for the painful varicose vein.   Return to the clinic or go to the nearest emergency room if any of your symptoms worsen or new symptoms occur.

## 2023-04-02 ENCOUNTER — Ambulatory Visit (HOSPITAL_COMMUNITY)
Admission: EM | Admit: 2023-04-02 | Discharge: 2023-04-02 | Disposition: A | Discharge status: Home / Self Care | Payer: Medicaid Other | Attending: Internal Medicine | Admitting: Internal Medicine

## 2023-04-02 ENCOUNTER — Encounter (HOSPITAL_COMMUNITY): Payer: Self-pay | Admitting: Emergency Medicine

## 2023-04-02 DIAGNOSIS — K649 Unspecified hemorrhoids: Secondary | ICD-10-CM | POA: Insufficient documentation

## 2023-04-02 DIAGNOSIS — N898 Other specified noninflammatory disorders of vagina: Secondary | ICD-10-CM | POA: Insufficient documentation

## 2023-04-02 DIAGNOSIS — B356 Tinea cruris: Secondary | ICD-10-CM | POA: Diagnosis present

## 2023-04-02 MED ORDER — FLUCONAZOLE 150 MG PO TABS: 150.0000 mg | ORAL_TABLET | ORAL | 0 refills | Status: DC

## 2023-04-02 MED ORDER — CLOTRIMAZOLE 1 % EX CREA: TOPICAL_CREAM | CUTANEOUS | 0 refills | Status: DC

## 2023-04-02 MED ORDER — ZINC OXIDE 20 % EX OINT: 1.0000 | TOPICAL_OINTMENT | CUTANEOUS | 0 refills | Status: AC | PRN

## 2023-04-02 MED ORDER — HYDROCORTISONE 1 % EX CREA: TOPICAL_CREAM | CUTANEOUS | 0 refills | Status: AC

## 2023-04-02 NOTE — Discharge Instructions (Addendum)
Apply hydrocortisone cream first, then zinc oxide barrier cream twice a day for the next 7 days to treat rectal irritation and hemorrhoids. Only use the cortisone twice a day. You may use zinc oxide cream multiple times throughout the day especially after bowel movements and wiping to protect the skin around your rectum and skin breakdown.  Apply clotrimazole cream to the vaginal area twice a day for 7 days to treat yeast vaginitis. Take Diflucan 1 pill today, then again in 3 days to treat yeast vaginitis.  The vaginal swab today will result in the next 2 to 3 days.  Staff will call you if you test positive for any other vaginal infections and treatment accordingly.  If you develop any new or worsening symptoms or if your symptoms do not start to improve, pleases return here or follow-up with your primary care provider. If your symptoms are severe, please go to the emergency room.

## 2023-04-02 NOTE — ED Triage Notes (Signed)
Pt reports after intercourse last week with spouse having vaginal itching. Denies discharge  Reports on Friday "had number two it burnt me" and had little bumps come up. Reports has hemorrhoids that is ongoing. Reports bathing and washing up at least twice a day. Using wet wipes. Tried cream that had before prescribed to her.

## 2023-04-02 NOTE — ED Provider Notes (Signed)
[facility]    CSN: [id number] Arrival date & time: 04/02/23  1351      History   Chief Complaint Chief Complaint  Patient presents with   Vaginal Itching   Rectal Pain    HPI Dana Lindsey is a 37 y.o. female.   Patient presents to urgent care for evaluation of vaginal itching that started 7 days ago after unprotected intercourse with her monogamous female partner. Symptoms have improved slightly since onset, but she reports significant vaginal discomfort has persisted. Denies vaginal discharge and vaginal odor. She also feels her urine is concentrated and would like to be checked for UTI. No dysuria, urinary frequency, urinary urgency, or hematuria. Further denies N/V/D, abdominal pain, and fever/chills. No recent changes in soaps or personal hygiene products. Has not attempted treatment of vaginal symptoms PTA.  In addition, she would like evaluation of hemorrhoids, this is a chronic problem for her. She sees GI for hemorrhoids and has tried multiple OTC and prescription ointments/medications. Most recently, tried diltiazem gel with lidocaine specifically formulated for her by GI provider but this did not help. Reports intermittent episodes of bright red blood when wiping after having bowel movement. No recent constipation, diarrhea, or changes in bowel/dietary habits. She has been using her son's eczema cream to the rectum and this has helped slightly with itch and inflammation of hemorrhoids. She has a follow-up GI appointment schedule for next week to discuss this further.   Vaginal Itching    Past Medical History:  Diagnosis Date   Asthma    [employer] 03/06/2020   Cholecystolithiasis    Ear infection    Gastritis    Grade II hemorrhoids    Hyperplastic colon polyp    Shortness of breath    with exertion    There are no problems to display for this patient.   Past Surgical History:  Procedure Laterality Date   CHOLECYSTECTOMY  08/23/2012   Procedure:  LAPAROSCOPIC CHOLECYSTECTOMY WITH INTRAOPERATIVE CHOLANGIOGRAM;  Surgeon: Mariella Saa, MD;  Location: [facility];  Service: General;  Laterality: N/A;   COLONOSCOPY     LIPOSUCTION  03/22/2022    OB History     Gravida  5   Para  4   Term  4   Preterm      AB  1   Living  4      SAB  1   IAB      Ectopic      Multiple  0   Live Births  4            Home Medications    Prior to Admission medications   Medication Sig Start Date End Date Taking? Authorizing Provider  clotrimazole (LOTRIMIN) 1 % cream Apply to affected area 2 times daily Patient not taking: Reported on 04/08/2023 04/02/23  Yes Carlisle Beers, FNP  fluconazole (DIFLUCAN) 150 MG tablet Take 1 tablet (150 mg total) by mouth every 3 (three) days. 04/02/23  Yes Carlisle Beers, FNP  hydrocortisone cream 1 % Apply to affected area 2 times daily Patient not taking: Reported on 04/08/2023 04/02/23  Yes Carlisle Beers, FNP  zinc oxide [facility] ZINC OXIDE) 20 % ointment Apply 1 Application topically as needed for irritation. 04/02/23  Yes Carlisle Beers, FNP  albuterol (VENTOLIN HFA) 108 (90 Base) MCG/ACT inhaler Inhale 2 puffs into the lungs every 6 (six) hours as needed for wheezing or shortness of breath. 06/16/22   Shade Flood, MD  AMBULATORY NON FORMULARY MEDICATION 2% Diltiazem gel with 5% lidocaine  Apply a pea sized amount into your rectum three times daily for 6 weeks Dispense 30 GM zero refill 06/17/22   Imogene Burn, MD  fluticasone (FLONASE) 50 MCG/ACT nasal spray Place 1 spray into both nostrils daily. 06/16/22   Shade Flood, MD  fluticasone (FLOVENT HFA) 44 MCG/ACT inhaler Inhale 1 puff into the lungs 2 (two) times daily as needed. For asthma flair or increased cough. 06/16/22   Shade Flood, MD  metroNIDAZOLE (FLAGYL) 500 MG tablet Take 1 tablet (500 mg total) by mouth 2 (two) times daily for 7 days. 04/06/23 04/13/23  Merrilee Jansky, MD  Multiple  Vitamins-Minerals (HAIR/SKIN/NAILS/BIOTIN PO) Take 2 tablets by mouth daily.    [provider]  Multiple Vitamins-Minerals (MULTIVITAMIN ADULT) CHEW Chew 2 tablets by mouth daily.    [provider]    Family History Family History  Problem Relation Age of Onset   Alcoholism Father    Asthma Daughter    Asthma Son    Colon cancer Neg Hx    Stomach cancer Neg Hx    Esophageal cancer Neg Hx    Rectal cancer Neg Hx     Social History Social History   Tobacco Use   Smoking status: Never   Smokeless tobacco: Never  Vaping Use   Vaping status: Never Used  Substance Use Topics   Alcohol use: No    Comment: rare   Drug use: No     Allergies   Patient has no known allergies.   Review of Systems Review of Systems   Physical Exam Triage Vital Signs ED Triage Vitals  Encounter Vitals Group     BP 04/02/23 1444 (!) 131/91     Systolic BP Percentile --      Diastolic BP Percentile --      Pulse Rate 04/02/23 1444 70     Resp 04/02/23 1444 18     Temp 04/02/23 1444 98.4 F (36.9 C)     Temp Source 04/02/23 1444 Oral     SpO2 04/02/23 1444 96 %     Weight --      Height --      Head Circumference --      Peak Flow --      Pain Score 04/02/23 1442 0     Pain Loc --      Pain Education --      Exclude from Growth Chart --    No data found.  Updated Vital Signs BP (!) 131/91 (BP Location: Right Arm)   Pulse 70   Temp 98.4 F (36.9 C) (Oral)   Resp 18   SpO2 96%   Visual Acuity Right Eye Distance:   Left Eye Distance:   Bilateral Distance:    Right Eye Near:   Left Eye Near:    Bilateral Near:     Physical Exam Vitals and nursing note reviewed.  Constitutional:      Appearance: She is not ill-appearing or toxic-appearing.  HENT:     Head: Normocephalic and atraumatic.     Right Ear: Hearing and external ear normal.     Left Ear: Hearing and external ear normal.     Nose: Nose normal.     Mouth/Throat:     Lips: Pink.  Eyes:      General: Lids are normal. Vision grossly intact. Gaze aligned appropriately.     Extraocular Movements: Extraocular movements intact.  Conjunctiva/sclera: Conjunctivae normal.  Pulmonary:     Effort: Pulmonary effort is normal.  Genitourinary:    Comments: Exam shows slightly raised erythematous maculopapular rash with to the gluteal fold surrounding perirectal area and to the vulvovaginal area as well. No signs of skin excoriation, pustular lesions, warmth, or significant erythema. There are a couple of non-thrombosed/non-bleeding external hemorrhoids present to the approximate 3 o'clock position of the perirectal tissue. Internal exam deferred. Musculoskeletal:     Cervical back: Neck supple.  Skin:    General: Skin is warm and dry.     Capillary Refill: Capillary refill takes less than 2 seconds.     Findings: No rash.  Neurological:     General: No focal deficit present.     Mental Status: She is alert and oriented to person, place, and time. Mental status is at baseline.     Cranial Nerves: No dysarthria or facial asymmetry.  Psychiatric:        Mood and Affect: Mood normal.        Speech: Speech normal.        Behavior: Behavior normal.        Thought Content: Thought content normal.        Judgment: Judgment normal.      UC Treatments / Results  Labs (all labs ordered are listed, but only abnormal results are displayed) Labs Reviewed  CERVICOVAGINAL ANCILLARY ONLY - Abnormal; Notable for the following components:      Result Value   Bacterial Vaginitis (gardnerella) Positive (*)    All other components within normal limits    EKG   Radiology No results found.  Procedures Procedures (including critical care time)  Medications Ordered in UC Medications - No data to display  Initial Impression / Assessment and Plan / UC Course  I have reviewed the triage vital signs and the nursing notes.  Pertinent labs & imaging results that were available during my care  of the patient were reviewed by me and considered in my medical decision making (see chart for details).   1. Tinea cruris, vaginal itching, hemorrhoids Evaluation suggests tinea cruris etiology, will treat vulvovaginitis with clotrimazole topically BID for 7-10 days and diflucan as prescribed. Hemorrhoids will be treated with topical hydrocortisone cream first, then zinc oxide barrier cream to prevent further skin breakdown to the surrounding perianal area/gluteal fold. No signs of secondary bacterial infection currently, infection return precautions discussed. PCP/GI follow-up as scheduled. Vaginal swab pending, will treat for any other infections based on swab result per protocol.   Counseled patient on potential for adverse effects with medications prescribed/recommended today, strict ER and return-to-clinic precautions discussed, patient verbalized understanding.   Final Clinical Impressions(s) / UC Diagnoses   Final diagnoses:  Tinea cruris  Vaginal itching  Hemorrhoids, unspecified hemorrhoid type     Discharge Instructions      Apply hydrocortisone cream first, then zinc oxide barrier cream twice a day for the next 7 days to treat rectal irritation and hemorrhoids. Only use the cortisone twice a day. You may use zinc oxide cream multiple times throughout the day especially after bowel movements and wiping to protect the skin around your rectum and skin breakdown.  Apply clotrimazole cream to the vaginal area twice a day for 7 days to treat yeast vaginitis. Take Diflucan 1 pill today, then again in 3 days to treat yeast vaginitis.  The vaginal swab today will result in the next 2 to 3 days.  Staff will call you if  you test positive for any other vaginal infections and treatment accordingly.  If you develop any new or worsening symptoms or if your symptoms do not start to improve, pleases return here or follow-up with your primary care provider. If your symptoms are severe, please  go to the emergency room.      ED Prescriptions     Medication Sig Dispense Auth. Provider   hydrocortisone cream 1 % Apply to affected area 2 times daily Patient not taking:  Reported on 04/08/2023 15 g Carlisle Beers, FNP   zinc oxide (MEIJER ZINC OXIDE) 20 % ointment Apply 1 Application topically as needed for irritation. 56.7 g Reita May M, FNP   clotrimazole (LOTRIMIN) 1 % cream Apply to affected area 2 times daily Patient not taking:  Reported on 04/08/2023 15 g Carlisle Beers, FNP   fluconazole (DIFLUCAN) 150 MG tablet Take 1 tablet (150 mg total) by mouth every 3 (three) days. 2 tablet Carlisle Beers, FNP      PDMP not reviewed this encounter.   Carlisle Beers, [place] 04/08/23 1610

## 2023-04-06 ENCOUNTER — Telehealth: Payer: Self-pay

## 2023-04-06 MED ORDER — METRONIDAZOLE 500 MG PO TABS: 500.0000 mg | ORAL_TABLET | Freq: Two times a day (BID) | ORAL | 0 refills | Status: AC

## 2023-04-06 NOTE — Telephone Encounter (Signed)
Per protocol, pt requires tx with Metronidazole. Reviewed with patient, verified pharmacy, prescription sent

## 2023-04-08 ENCOUNTER — Ambulatory Visit: Payer: Medicaid Other | Admitting: Physician Assistant

## 2023-04-08 ENCOUNTER — Encounter: Payer: Self-pay | Admitting: Physician Assistant

## 2023-04-08 VITALS — BP 124/80 | HR 71 | Ht 63.0 in | Wt 204.0 lb

## 2023-04-08 DIAGNOSIS — K6289 Other specified diseases of anus and rectum: Secondary | ICD-10-CM

## 2023-04-08 DIAGNOSIS — K625 Hemorrhage of anus and rectum: Secondary | ICD-10-CM

## 2023-04-08 DIAGNOSIS — R234 Changes in skin texture: Secondary | ICD-10-CM | POA: Diagnosis not present

## 2023-04-08 NOTE — Progress Notes (Signed)
Chief Complaint: Rectal irritation, Rectal bleeding  HPI:    Dana Lindsey is a 37 year old female with a past medical history as listed below including grade 2 hemorrhoids status post hemorrhoid band ligation by Dr. Leonides Schanz, who was referred to me by Shade Flood, MD for a complaint of hemorrhoids.      12/25/2021 patient seen in clinic by me for hemorrhoids.  That time external hemorrhoid tag and some visible excoriations, grade 2 hemorrhoids with no visible fissure.  She was scheduled for a colonoscopy as she she is a good candidate for banding and started on Hydrocortisone suppositories.    12/31/2021 colonoscopy with one 5 mm polyp in the cecum, one 3 mm polyp in the rectum and nonbleeding internal hemorrhoids.    Patient then underwent hemorrhoid banding on 03/19/2022, 06/17/2022 and 08/18/2022.    Today, the patient tells me that she is continued with rectal irritation and rectal bleeding.  Tells me sometimes she does feel like there is a tear back there sometimes and has been using the Diltiazem that was sent into the pharmacy for her 2 times a day.  Last week about 3 to 4 days ago was the last time she saw on any bleeding but remains very irritated and itchy back there.  She finds herself wiping multiple times a day and really trying to make sure that she is cleaned out with a wet wipe and toilet paper.  Also describes occasional leakage of a brown substance.    Denies fever, chills or weight loss.  Past Medical History:  Diagnosis Date   Asthma    Phreesia 03/06/2020   Cholecystolithiasis    Ear infection    Gastritis    Grade II hemorrhoids    Hyperplastic colon polyp    Shortness of breath    with exertion    Past Surgical History:  Procedure Laterality Date   CHOLECYSTECTOMY  08/23/2012   Procedure: LAPAROSCOPIC CHOLECYSTECTOMY WITH INTRAOPERATIVE CHOLANGIOGRAM;  Surgeon: Mariella Saa, MD;  Location: [facility];  Service: General;  Laterality: N/A;   COLONOSCOPY      LIPOSUCTION  03/22/2022    Current Outpatient Medications  Medication Sig Dispense Refill   albuterol (VENTOLIN HFA) 108 (90 Base) MCG/ACT inhaler Inhale 2 puffs into the lungs every 6 (six) hours as needed for wheezing or shortness of breath. 18 g 0   AMBULATORY NON FORMULARY MEDICATION 2% Diltiazem gel with 5% lidocaine  Apply a pea sized amount into your rectum three times daily for 6 weeks Dispense 30 GM zero refill 30 g 0   clotrimazole (LOTRIMIN) 1 % cream Apply to affected area 2 times daily 15 g 0   fluconazole (DIFLUCAN) 150 MG tablet Take 1 tablet (150 mg total) by mouth every 3 (three) days. 2 tablet 0   fluticasone (FLONASE) 50 MCG/ACT nasal spray Place 1 spray into both nostrils daily. 11.1 mL 11   fluticasone (FLOVENT HFA) 44 MCG/ACT inhaler Inhale 1 puff into the lungs 2 (two) times daily as needed. For asthma flair or increased cough. 1 each 6   hydrocortisone cream 1 % Apply to affected area 2 times daily 15 g 0   metroNIDAZOLE (FLAGYL) 500 MG tablet Take 1 tablet (500 mg total) by mouth 2 (two) times daily for 7 days. 14 tablet 0   Multiple Vitamins-Minerals (HAIR/SKIN/NAILS/BIOTIN PO) Take 2 tablets by mouth daily.     Multiple Vitamins-Minerals (MULTIVITAMIN ADULT) CHEW Chew 2 tablets by mouth daily.     zinc  oxide (MEIJER ZINC OXIDE) 20 % ointment Apply 1 Application topically as needed for irritation. 56.7 g 0   No current facility-administered medications for this visit.    Allergies as of 04/08/2023   (No Known Allergies)    Family History  Problem Relation Age of Onset   Alcoholism Father    Asthma Daughter    Asthma Son    Colon cancer Neg Hx    Stomach cancer Neg Hx    Esophageal cancer Neg Hx    Rectal cancer Neg Hx     Social History   Socioeconomic History   Marital status: Married    Spouse name: Not on file   Number of children: 4   Years of education: Not on file   Highest education level: Not on file  Occupational History   Not on file   Tobacco Use   Smoking status: Never   Smokeless tobacco: Never  Vaping Use   Vaping status: Never Used  Substance and Sexual Activity   Alcohol use: No    Comment: rare   Drug use: No   Sexual activity: Yes    Birth control/protection: I.U.D.  Other Topics Concern   Not on file  Social History Narrative   Not on file   Social Determinants of Health   Financial Resource Strain: Low Risk  (12/02/2018)   Overall Financial Resource Strain (CARDIA)    Difficulty of Paying Living Expenses: Not hard at all  Food Insecurity: No Food Insecurity (12/02/2018)   Hunger Vital Sign    Worried About Running Out of Food in the Last Year: Never true    Ran Out of Food in the Last Year: Never true  Transportation Needs: Unknown (12/02/2018)   PRAPARE - Administrator, Civil Service (Medical): No    Lack of Transportation (Non-Medical): Not on file  Physical Activity: Not on file  Stress: Stress Concern Present (12/02/2018)   [employer] of Occupational Health - Occupational Stress Questionnaire    Feeling of Stress : To some extent  Social Connections: Not on file  Intimate Partner Violence: Not At Risk (12/02/2018)   Humiliation, Afraid, Rape, and Kick questionnaire    Fear of Current or Ex-Partner: No    Emotionally Abused: No    Physically Abused: No    Sexually Abused: No    Review of Systems:    Constitutional: No weight loss, fever or chills Cardiovascular: No chest pain   Respiratory: No SOB  Gastrointestinal: See HPI and otherwise negative   Physical Exam:  Vital signs: BP 124/80   Pulse 71   Ht 5\' 3"  (1.6 m)   Wt 204 lb (92.5 kg)   BMI 36.14 kg/m    Constitutional:   Pleasant overweight Hispanic female appears to be in NAD, Well developed, Well nourished, alert and cooperative Respiratory: Respirations even and unlabored. Lungs clear to auscultation bilaterally.   No wheezes, crackles, or rhonchi.  Cardiovascular: Normal S1, S2. No MRG. Regular rate and  rhythm. No peripheral edema, cyanosis or pallor.  Gastrointestinal:  Soft, nondistended, nontender. No rebound or guarding. Normal bowel sounds. No appreciable masses or hepatomegaly. Rectal: External: Visible thickening of the skin and excoriations (likely from itching), deep folds of the rectal sphincter, no external hemorrhoids; internal: Some tenderness to palpation ; and anoscopy: No hemorrhoids, no fissure but some tenderness per patient, maybe slightly increased sphincter tone Psychiatric:  Demonstrates good judgement and reason without abnormal affect or behaviors.  RELEVANT LABS AND IMAGING:  CBC    Component Value Date/Time   WBC 8.1 06/12/2021 1027   WBC 8.3 01/31/2021 1515   RBC 4.57 06/12/2021 1027   RBC 4.48 01/31/2021 1515   HGB 13.4 06/12/2021 1027   HCT 38.3 06/12/2021 1027   PLT 261 06/12/2021 1027   MCV 84 06/12/2021 1027   MCH 29.3 06/12/2021 1027   MCH 29.0 01/31/2021 1515   MCHC 35.0 06/12/2021 1027   MCHC 33.2 01/31/2021 1515   RDW 11.3 (L) 06/12/2021 1027   LYMPHSABS 2.4 01/31/2021 1515   LYMPHSABS 2.2 05/20/2018 1129   MONOABS 0.4 01/31/2021 1515   EOSABS 0.1 01/31/2021 1515   EOSABS 0.2 05/20/2018 1129   BASOSABS 0.1 01/31/2021 1515   BASOSABS 0.0 05/20/2018 1129    CMP     Component Value Date/Time   NA 139 01/31/2021 1515   NA 142 04/23/2020 1635   K 3.3 (L) 01/31/2021 1515   CL 103 01/31/2021 1515   CO2 28 01/31/2021 1515   GLUCOSE 93 01/31/2021 1515   BUN 12 01/31/2021 1515   BUN 15 04/23/2020 1635   CREATININE 0.57 01/31/2021 1515   CREATININE 0.58 08/22/2012 1524   CALCIUM 9.2 01/31/2021 1515   PROT 7.7 04/23/2020 1635   ALBUMIN 4.6 04/23/2020 1635   AST 15 04/23/2020 1635   ALT 15 04/23/2020 1635   ALKPHOS 65 04/23/2020 1635   BILITOT 0.4 04/23/2020 1635   GFRNONAA >60 01/31/2021 1515   GFRAA 137 04/23/2020 1635    Assessment: 1.  Rectal irritation and bleeding: Suspect a lot of this is from external dermatitis and irritation  over the years with visible skin thickening and excoriations, maybe occasional anal fissure as well with dripping of blood, but none over the past 3 to 4 days and not seen at time of exam 2.  Grade 2 hemorrhoids: Status post hemorrhoid band ligation the last in December, not seen at time of exam today  Plan: 1.  Recommend the patient use A&D ointment around and over rectum after wiping gently and dabbing dry.  She should apply this 3-4 times a day or whenever irritated and try to avoid itching. 2.  Today the rectum actually looks quite well other than this dermatitis.  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  Would appreciate their opinion. 3.  Discontinue Diltiazem for now. 4.  Patient to follow in clinic with me in 2 months.  She will check in before then to let me know how things are going.  Hyacinth Meeker, PA-C [facility] 04/08/2023, 1:19 PM  Cc: Shade Flood, MD

## 2023-04-08 NOTE — Patient Instructions (Signed)
Use over the counter A & D ointment 3-4 times a day.  Try to limit irritation to your rectum.   You have been scheduled for an appointment with ______________ at [facility]. Your appointment is on _______________ at _________________. Please arrive at ________________ for registration. Make certain to bring a list of current medications, including any over the counter medications or vitamins. Also bring your co-pay if you have one as well as your insurance cards. [facility] is located at [address]. Should you need to reschedule your appointment, please contact them at [phone].   _______________________________________________________  If your blood pressure at your visit was 140/90 or greater, please contact your primary care physician to follow up on this.  _______________________________________________________  If you are age 57 or older, your body mass index should be between 23-30. Your Body mass index is 36.14 kg/m. If this is out of the aforementioned range listed, please consider follow up with your Primary Care Provider.  If you are age 52 or younger, your body mass index should be between 19-25. Your Body mass index is 36.14 kg/m. If this is out of the aformentioned range listed, please consider follow up with your Primary Care Provider.   ________________________________________________________  The [facility] providers would like to encourage you to use [facility] to communicate with providers for non-urgent requests or questions.  Due to long hold times on the telephone, sending your provider a message by [facility] may be a faster and more efficient way to get a response.  Please allow 48 business hours for a response.  Please remember that this is for non-urgent requests.  _______________________________________________________  I appreciate the opportunity to care for you. Hyacinth Meeker PA-C

## 2023-04-10 NOTE — Progress Notes (Signed)
I agree with the assessment and plan as outlined by Ms. Lemmon. 

## 2023-04-29 ENCOUNTER — Other Ambulatory Visit (HOSPITAL_COMMUNITY)
Admission: RE | Admit: 2023-04-29 | Discharge: 2023-04-29 | Disposition: A | Discharge status: Home / Self Care | Payer: Medicaid Other | Source: Ambulatory Visit | Attending: Family Medicine | Admitting: Family Medicine

## 2023-04-29 ENCOUNTER — Ambulatory Visit (INDEPENDENT_AMBULATORY_CARE_PROVIDER_SITE_OTHER): Payer: Medicaid Other | Admitting: Family Medicine

## 2023-04-29 ENCOUNTER — Encounter: Payer: Self-pay | Admitting: Family Medicine

## 2023-04-29 VITALS — BP 133/84 | HR 62 | Wt 203.0 lb

## 2023-04-29 DIAGNOSIS — Z30431 Encounter for routine checking of intrauterine contraceptive device: Secondary | ICD-10-CM | POA: Diagnosis not present

## 2023-04-29 DIAGNOSIS — Z01419 Encounter for gynecological examination (general) (routine) without abnormal findings: Secondary | ICD-10-CM | POA: Diagnosis present

## 2023-04-29 DIAGNOSIS — Z1339 Encounter for screening examination for other mental health and behavioral disorders: Secondary | ICD-10-CM

## 2023-04-29 MED ORDER — CLOTRIMAZOLE-BETAMETHASONE 1-0.05 % EX CREA: 1.0000 | TOPICAL_CREAM | Freq: Two times a day (BID) | CUTANEOUS | 0 refills | Status: DC

## 2023-04-29 NOTE — Progress Notes (Signed)
ANNUAL EXAM Patient name: Dana Lindsey MRN [id number]  Date of birth: 11-Jun-1986 Chief Complaint:   Annual Exam  History of Present Illness:   Dana Lindsey is a 37 y.o.  [phone]  female  being seen today for a routine annual exam.  Current complaints: some irritation to the vulva bilaterally. Had BV not to long ago, thinks that the irritation started around the same time, but hasn't gone completely away.  No LMP recorded. (Menstrual status: IUD).   Upstream - 04/29/23 0849       Pregnancy Intention Screening   Does the patient want to become pregnant in the next year? No    Does the patient's partner want to become pregnant in the next year? No    Would the patient like to discuss contraceptive options today? No      Contraception Wrap Up   Current Method IUD or IUS    End Method IUD or IUS    Contraception Counseling Provided No    How was the end contraceptive method provided? N/A             Last pap 12/2016. Results were: NILM w/ HRHPV negative. H/O abnormal pap: no Last mammogram: n/a     04/29/2023    8:49 AM 03/26/2023    1:30 PM 06/16/2022   10:29 AM 12/05/2021    3:12 PM 09/26/2021   10:46 AM  Depression screen PHQ 2/9  Decreased Interest 0 0 0 0 0  Down, Depressed, Hopeless 0 0 0 0 0  PHQ - 2 Score 0 0 0 0 0  Altered sleeping 1 0 0    Tired, decreased energy 1 0 1    Change in appetite 0 0 0    Feeling bad or failure about yourself  0 0 0    Trouble concentrating 0 0 0    Moving slowly or fidgety/restless 0 0 0    Suicidal thoughts 0 0 0    PHQ-9 Score 2 0 1          04/29/2023    8:49 AM 03/26/2023    1:31 PM 03/09/2020   10:30 AM  GAD 7 : Generalized Anxiety Score  Nervous, Anxious, on Edge 0 0 0  Control/stop worrying 0 0 0  Worry too much - different things 1 0 0  Trouble relaxing 1 0 0  Restless 1 0 0  Easily annoyed or irritable 0 0 0  Afraid - awful might happen 0 0 0  Total GAD 7 Score 3 0 0  Anxiety Difficulty   Not difficult at  all     Review of Systems:   Pertinent items are noted in HPI Denies any headaches, blurred vision, fatigue, shortness of breath, chest pain, abdominal pain, abnormal vaginal discharge/itching/odor/irritation, problems with periods, bowel movements, urination, or intercourse unless otherwise stated above. Pertinent History Reviewed:  Reviewed past medical,surgical, social and family history.  Reviewed problem list, medications and allergies. Physical Assessment:   Vitals:   04/29/23 0844 04/29/23 0851  BP: (!) 125/91 133/84  Pulse: 68 62  Weight: 203 lb (92.1 kg)   Body mass index is 35.96 kg/m.        Physical Examination:   General appearance - well appearing, and in no distress  Mental status - alert, oriented to person, place, and time  Psych:  She has a normal mood and affect  Skin - warm and dry, normal color, no suspicious lesions noted  Chest -  effort normal, all lung fields clear to auscultation bilaterally  Heart - normal rate and regular rhythm  Neck:  midline trachea, no thyromegaly or nodules  Breasts - breasts appear normal, no suspicious masses, no skin or nipple changes or axillary nodes  Abdomen - soft, nontender, nondistended, no masses or organomegaly  Pelvic - VULVA: Vulva slightly erythematous bilaterally. Otherwise normal appearing vulva with no masses, tenderness or lesions  VAGINA: normal appearing vagina with normal color and discharge, no lesions  CERVIX: normal appearing cervix without discharge or lesions, no CMT  Thin prep pap is done with HR HPV cotesting IUD strings seen.  UTERUS: uterus is felt to be normal size, shape, consistency and nontender   ADNEXA: No adnexal masses or tenderness noted.  Extremities:  No swelling or varicosities noted  Chaperone present for exam  Assessment & Plan:  1. Well woman exam with routine gynecological exam Lotrisone prescribed for irritation - Cytology - PAP( [place])  2. IUD check  up present   Labs/procedures today: none  Mammogram: @ 40yo, or sooner if problems Colonoscopy: @ 39yo, or sooner if problems  No orders of the defined types were placed in this encounter.   Meds:  Meds ordered this encounter  Medications   clotrimazole-betamethasone (LOTRISONE) cream    Sig: Apply 1 Application topically 2 (two) times daily.    Dispense:  30 g    Refill:  0    Follow-up: No follow-ups on file.  Levie Heritage, DO 04/29/2023 10:19 AM

## 2023-04-30 ENCOUNTER — Other Ambulatory Visit: Payer: Self-pay

## 2023-04-30 DIAGNOSIS — I8390 Asymptomatic varicose veins of unspecified lower extremity: Secondary | ICD-10-CM

## 2023-05-01 LAB — CYTOLOGY - PAP
Comment: NEGATIVE
Diagnosis: NEGATIVE
High risk HPV: NEGATIVE

## 2023-05-06 ENCOUNTER — Ambulatory Visit (INDEPENDENT_AMBULATORY_CARE_PROVIDER_SITE_OTHER): Payer: Medicaid Other

## 2023-05-06 DIAGNOSIS — Z23 Encounter for immunization: Secondary | ICD-10-CM | POA: Diagnosis not present

## 2023-05-06 NOTE — Progress Notes (Signed)
Pt came in for flu vaccine . Gave injection in right deltoid and pt tolerated well. Pt had no concerns at this time .

## 2023-05-12 ENCOUNTER — Encounter: Payer: Self-pay | Admitting: Physician Assistant

## 2023-05-12 ENCOUNTER — Ambulatory Visit (HOSPITAL_COMMUNITY)
Admission: RE | Admit: 2023-05-12 | Discharge: 2023-05-12 | Disposition: A | Discharge status: Home / Self Care | Payer: Medicaid Other | Source: Ambulatory Visit | Attending: Vascular Surgery | Admitting: Vascular Surgery

## 2023-05-12 ENCOUNTER — Ambulatory Visit (INDEPENDENT_AMBULATORY_CARE_PROVIDER_SITE_OTHER): Payer: Medicaid Other | Admitting: Physician Assistant

## 2023-05-12 VITALS — BP 127/81 | HR 105 | Temp 98.4°F | Resp 18 | Ht 62.5 in | Wt 199.3 lb

## 2023-05-12 DIAGNOSIS — I872 Venous insufficiency (chronic) (peripheral): Secondary | ICD-10-CM

## 2023-05-12 DIAGNOSIS — I8390 Asymptomatic varicose veins of unspecified lower extremity: Secondary | ICD-10-CM | POA: Diagnosis present

## 2023-05-12 DIAGNOSIS — I8391 Asymptomatic varicose veins of right lower extremity: Secondary | ICD-10-CM

## 2023-05-12 DIAGNOSIS — I8393 Asymptomatic varicose veins of bilateral lower extremities: Secondary | ICD-10-CM

## 2023-05-12 NOTE — Progress Notes (Signed)
Requested by:  Shade Flood, MD [address] [place],  [place] [address]  Reason for consultation: painful varicose vein behind right knee    History of Present Illness   Dana Lindsey is a 37 y.o. (18-Mar-1986) female who presents for evaluation of painful varicose vein behind right knee. She says she has had varicose veins for many years and swelling in her legs on and off for many years but over past 1 year seems to be worse. Her pain is mainly behind right knee. The pain comes and goes and she describes it mostly as an aching/ pressure. Makes it hard for her to squat down or sometimes even causes her to limp when walking. She does also get some swelling in her legs and ankles at times. 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 She does not elevate because  of her busy schedule and she has never tried compression stockings. She denies any throbbing, itching, burning, stinging, bleeding or ulceration. She does have spider veins as well that she has had for many years but she says they do not bother her. She does not have any history of DVT. She does have history of venous disease in her mother.   Venous symptoms include: aching, pressure, swelling Onset/duration:  many years  Occupation:  SAHM Aggravating factors: sitting, standing Alleviating factors: none Compression:  no Helps:  unsure Pain medications:  no Previous vein procedures:  no History of DVT:  no  Past Medical History:  Diagnosis Date   Asthma    [employer] 03/06/2020   Cholecystolithiasis    Ear infection    Gastritis    Grade II hemorrhoids    Hyperplastic colon polyp    Shortness of breath    with exertion    Past Surgical History:  Procedure Laterality Date   CHOLECYSTECTOMY  08/23/2012   Procedure: LAPAROSCOPIC CHOLECYSTECTOMY WITH INTRAOPERATIVE CHOLANGIOGRAM;  Surgeon: Mariella Saa, MD;  Location: [facility]  OR;  Service: General;  Laterality: N/A;   COLONOSCOPY     LIPOSUCTION  03/22/2022    Social History   Socioeconomic History   Marital status: Married    Spouse name: Not on file   Number of children: 4   Years of education: Not on file   Highest education level: Not on file  Occupational History   Not on file  Tobacco Use   Smoking status: Never   Smokeless tobacco: Never  Vaping Use   Vaping status: Never Used  Substance and Sexual Activity   Alcohol use: No    Comment: rare   Drug use: No   Sexual activity: Yes    Birth control/protection: I.U.D.  Other Topics Concern   Not on file  Social History Narrative   Not on file   Social Determinants of Health   Financial Resource Strain: Low Risk  (12/02/2018)   Overall Financial Resource Strain (CARDIA)    Difficulty of Paying Living Expenses: Not hard at all  Food Insecurity: No Food Insecurity (12/02/2018)   Hunger Vital Sign    Worried About Running Out of Food in the Last Year: Never true    Ran Out of Food in the Last Year: Never true  Transportation Needs: Unknown (12/02/2018)   PRAPARE - Administrator, Civil Service (Medical): No    Lack of Transportation (Non-Medical): Not on  file  Physical Activity: Not on file  Stress: Stress Concern Present (12/02/2018)   [employer] of Occupational Health - Occupational Stress Questionnaire    Feeling of Stress : To some extent  Social Connections: Not on file  Intimate Partner Violence: Not At Risk (12/02/2018)   Humiliation, Afraid, Rape, and Kick questionnaire    Fear of Current or Ex-Partner: No    Emotionally Abused: No    Physically Abused: No    Sexually Abused: No   Family History  Problem Relation Age of Onset   Alcoholism Father    Asthma Daughter    Asthma Son    Colon cancer Neg Hx    Stomach cancer Neg Hx    Esophageal cancer Neg Hx    Rectal cancer Neg Hx     Current Outpatient Medications  Medication Sig Dispense Refill    albuterol (VENTOLIN HFA) 108 (90 Base) MCG/ACT inhaler Inhale 2 puffs into the lungs every 6 (six) hours as needed for wheezing or shortness of breath. 18 g 0   fluticasone (FLONASE) 50 MCG/ACT nasal spray Place 1 spray into both nostrils daily. 11.1 mL 11   fluticasone (FLOVENT HFA) 44 MCG/ACT inhaler Inhale 1 puff into the lungs 2 (two) times daily as needed. For asthma flair or increased cough. 1 each 6   hydrocortisone cream 1 % Apply to affected area 2 times daily 15 g 0   levonorgestrel (LILETTA, 52 MG,) 20.1 MCG/DAY IUD IUD 1 each by Intrauterine route once.     Multiple Vitamins-Minerals (HAIR/SKIN/NAILS/BIOTIN PO) Take 2 tablets by mouth daily.     Multiple Vitamins-Minerals (MULTIVITAMIN ADULT) CHEW Chew 2 tablets by mouth daily.     zinc oxide (MEIJER ZINC OXIDE) 20 % ointment Apply 1 Application topically as needed for irritation. 56.7 g 0   AMBULATORY NON FORMULARY MEDICATION 2% Diltiazem gel with 5% lidocaine  Apply a pea sized amount into your rectum three times daily for 6 weeks Dispense 30 GM zero refill (Patient not taking: Reported on 05/12/2023) 30 g 0   clotrimazole (LOTRIMIN) 1 % cream Apply to affected area 2 times daily (Patient not taking: Reported on 05/12/2023) 15 g 0   clotrimazole-betamethasone (LOTRISONE) cream Apply 1 Application topically 2 (two) times daily. (Patient not taking: Reported on 05/12/2023) 30 g 0   fluconazole (DIFLUCAN) 150 MG tablet Take 1 tablet (150 mg total) by mouth every 3 (three) days. (Patient not taking: Reported on 05/12/2023) 2 tablet 0   No current facility-administered medications for this visit.    No Known Allergies  REVIEW OF SYSTEMS (negative unless checked):   Cardiac:  []  Chest pain or chest pressure? []  Shortness of breath upon activity? []  Shortness of breath when lying flat? []  Irregular heart rhythm?  Vascular:  []  Pain in calf, thigh, or hip brought on by walking? []  Pain in feet at night that wakes you up from your  sleep? []  Blood clot in your veins? []  Leg swelling?  Pulmonary:  []  Oxygen at home? []  Productive cough? []  Wheezing?  Neurologic:  []  Sudden weakness in arms or legs? []  Sudden numbness in arms or legs? []  Sudden onset of difficult speaking or slurred speech? []  Temporary loss of vision in one eye? []  Problems with dizziness?  Gastrointestinal:  []  Blood in stool? []  Vomited blood?  Genitourinary:  []  Burning when urinating? []  Blood in urine?  Psychiatric:  []  Major depression  Hematologic:  []  Bleeding problems? []  Problems with blood clotting?  Dermatologic:  []   Rashes or ulcers?  Constitutional:  []  Fever or chills?  Ear/Nose/Throat:  []  Change in hearing? []  Nose bleeds? []  Sore throat?  Musculoskeletal:  []  Back pain? []  Joint pain? []  Muscle pain?   Physical Examination     Vitals:   05/12/23 1310  BP: 127/81  Pulse: (!) 105  Resp: 18  Temp: 98.4 F (36.9 C)  TempSrc: Temporal  SpO2: 98%  Weight: 199 lb 4.8 oz (90.4 kg)  Height: 5' 2.5" (1.588 m)   Body mass index is 35.87 kg/m.  General:  WDWN in NAD; vital signs documented above Gait: Normal HENT: WNL, normocephalic Pulmonary: normal non-labored breathing , without wheezing Cardiac: regular HR Abdomen: soft Vascular Exam/Pulses:2+ DP and PT pulses bilaterally, feet warm and well perfused Extremities: without varicose veins, with reticular veins of right lateral posterior popliteal fossa, scattered spider veins bilateral thighs, without edema, without stasis pigmentation, without lipodermatosclerosis, without ulcers Musculoskeletal: no muscle wasting or atrophy  Neurologic: A&O X 3;  No focal weakness or paresthesias are detected Psychiatric:  The pt has Normal affect.  Non-invasive Vascular Imaging   BLE Venous Insufficiency Duplex (05/12/23):  RLE:  No DVT and SVT GSV reflux  SFJ GSV diameter 0.34-0.42 cm SSV reflux mid calf CFV deep venous reflux   Medical Decision  Making   Lorry Want Ngu is a 37 y.o. female who presents with: RLE chronic venous insufficiency with varicose veins with pain behind right knee for > 1 year.  She does not have a DVT or SVT. She does have some deep reflux in CFV. She also has a little incompetence in the GSV at the [facility] and in the SSV in the mid calf. No other significant reflux identified. Based on duplex today she would not be a candidate for a venous ablation. I did however discuss option with her for sclerotherapy and I provided her with RN Morton Amy contact information if she would like to schedule. I explained that sclerotherapy is cosmetic and not covered by insurance.  Based on the patient's history and examination, I recommend: daily elevation of 20-30 minutes above level of heart, daily compression stocking use, exercise, weight reduction, refraining from prolonged sitting or standing. I discussed with the patient the use of her 15-20 mm knee high compression stockings. She was measured and fitted for these at today's visit She can follow up as needed if she has any new or concerning symptoms   Graceann Congress, PA-C [facility] Office: [phone]  05/12/2023, 1:39 PM  Clinic MD: Steve Rattler

## 2023-05-22 NOTE — Addendum Note (Signed)
Addended byJaci Lazier, Shrita Thien on: 05/22/2023 10:51 AM   Modules accepted: Orders

## 2023-06-24 ENCOUNTER — Ambulatory Visit: Payer: Medicaid Other | Admitting: Physician Assistant

## 2023-07-16 ENCOUNTER — Encounter (HOSPITAL_COMMUNITY): Payer: Self-pay | Admitting: Family Medicine

## 2023-07-16 ENCOUNTER — Ambulatory Visit (HOSPITAL_COMMUNITY)
Admission: EM | Admit: 2023-07-16 | Discharge: 2023-07-16 | Disposition: A | Discharge status: Home / Self Care | Payer: Medicaid Other | Attending: Family Medicine | Admitting: Family Medicine

## 2023-07-16 DIAGNOSIS — A09 Infectious gastroenteritis and colitis, unspecified: Secondary | ICD-10-CM

## 2023-07-16 DIAGNOSIS — L03119 Cellulitis of unspecified part of limb: Secondary | ICD-10-CM

## 2023-07-16 MED ORDER — CEPHALEXIN 500 MG PO CAPS: 500.0000 mg | ORAL_CAPSULE | Freq: Four times a day (QID) | ORAL | 0 refills | Status: AC

## 2023-07-16 NOTE — ED Triage Notes (Addendum)
Pt recently traveled to [place] and was bitten by bugs that she thinks were mosquitos on both ankle. She states they are very painful and swollen. She also recently began having diarrhea.   She also c/o being dizzy and having pressure in neck/ shoulder and recently found she has been having high BP for two months.

## 2023-07-16 NOTE — ED Provider Notes (Signed)
[facility]    CSN: [id number] Arrival date & time: 07/16/23  0802      History   Chief Complaint Chief Complaint  Patient presents with   Insect Bite   Diarrhea   Hypertension    HPI Dana Lindsey is a 37 y.o. female.   Dana Lindsey is a 37 year old female who just returned from a trip to [place].  While she was there at the [facility] she was bitten on her ankles by [employer] she was bitten on her ankles by some kind of insect which she did not see.  Afterwards she had some swelling and itching which she continued to itch with her feet.  She then developed some erythema and swelling of the lateral ankle and foot.  This has improved the last several days but she has continued pain over the bite site with an eschar formation.  She denies any distal numbness or weakness but is having some red streaking from the bite.  She denies any fever, chills, nausea, vomiting, syncope, chest pain or shortness of breath.  She has also had 4-5 days of diarrhea that started prior to her leaving.  She has been able to drink and eat adequately and has not seen any blood or mucus in her stool.  The history is provided by the patient.  Emesis Associated symptoms: diarrhea   Associated symptoms: no abdominal pain, no arthralgias, no chills, no fever and no myalgias     Past Medical History:  Diagnosis Date   Asthma    [employer] 03/06/2020   Cholecystolithiasis    Ear infection    Gastritis    Grade II hemorrhoids    Hyperplastic colon polyp    Shortness of breath    with exertion    There are no problems to display for this patient.   Past Surgical History:  Procedure Laterality Date   CHOLECYSTECTOMY  08/23/2012   Procedure: LAPAROSCOPIC CHOLECYSTECTOMY WITH INTRAOPERATIVE CHOLANGIOGRAM;  Surgeon: Mariella Saa, MD;  Location: [facility] OR;  Service: General;  Laterality: N/A;   COLONOSCOPY     LIPOSUCTION  03/22/2022    OB History     Gravida  5    Para  4   Term  4   Preterm      AB  1   Living  4      SAB  1   IAB      Ectopic      Multiple  0   Live Births  4            Home Medications    Prior to Admission medications   Medication Sig Start Date End Date Taking? Authorizing Provider  cephALEXin (KEFLEX) 500 MG capsule Take 1 capsule (500 mg total) by mouth 4 (four) times daily for 5 days. 07/16/23 07/21/23 Yes Ivor Messier, MD  albuterol (VENTOLIN HFA) 108 (90 Base) MCG/ACT inhaler Inhale 2 puffs into the lungs every 6 (six) hours as needed for wheezing or shortness of breath. 06/16/22   Shade Flood, MD  AMBULATORY NON FORMULARY MEDICATION 2% Diltiazem gel with 5% lidocaine  Apply a pea sized amount into your rectum three times daily for 6 weeks Dispense 30 GM zero refill Patient not taking: Reported on 05/12/2023 06/17/22   Imogene Burn, MD  clotrimazole (LOTRIMIN) 1 % cream Apply to affected area 2 times daily Patient not taking: Reported on 05/12/2023 04/02/23   Carlisle Beers, FNP  clotrimazole-betamethasone (LOTRISONE)  cream Apply 1 Application topically 2 (two) times daily. Patient not taking: Reported on 05/12/2023 04/29/23   Levie Heritage, DO  fluconazole (DIFLUCAN) 150 MG tablet Take 1 tablet (150 mg total) by mouth every 3 (three) days. Patient not taking: Reported on 05/12/2023 04/02/23   Carlisle Beers, FNP  fluticasone [facility]) 50 MCG/ACT nasal spray Place 1 spray into both nostrils daily. 06/16/22   Shade Flood, MD  fluticasone (FLOVENT HFA) 44 MCG/ACT inhaler Inhale 1 puff into the lungs 2 (two) times daily as needed. For asthma flair or increased cough. 06/16/22   Shade Flood, MD  hydrocortisone cream 1 % Apply to affected area 2 times daily 04/02/23   Carlisle Beers, FNP  levonorgestrel (LILETTA, 52 MG,) 20.1 MCG/DAY IUD IUD 1 each by Intrauterine route once.    [provider]  Multiple Vitamins-Minerals (HAIR/SKIN/NAILS/BIOTIN PO) Take 2  tablets by mouth daily.    [provider]  Multiple Vitamins-Minerals (MULTIVITAMIN ADULT) CHEW Chew 2 tablets by mouth daily.    [provider]  zinc oxide (MEIJER ZINC OXIDE) 20 % ointment Apply 1 Application topically as needed for irritation. 04/02/23   Carlisle Beers, FNP    Family History Family History  Problem Relation Age of Onset   Alcoholism Father    Asthma Daughter    Asthma Son    Colon cancer Neg Hx    Stomach cancer Neg Hx    Esophageal cancer Neg Hx    Rectal cancer Neg Hx     Social History Social History   Tobacco Use   Smoking status: Never   Smokeless tobacco: Never  Vaping Use   Vaping status: Never Used  Substance Use Topics   Alcohol use: No    Comment: rare   Drug use: No     Allergies   Patient has no known allergies.   Review of Systems Review of Systems  Constitutional:  Negative for activity change, chills, fatigue and fever.  Respiratory:  Negative for shortness of breath.   Cardiovascular:  Negative for chest pain and palpitations.  Gastrointestinal:  Positive for diarrhea. Negative for abdominal distention, abdominal pain, blood in stool (or mucus), nausea and vomiting.  Musculoskeletal:  Negative for arthralgias, myalgias, neck pain and neck stiffness.  Skin:  Positive for color change (red streaking from R ankle bite) and wound (multiple bug bites on both ankles).  Neurological:  Negative for weakness and numbness.  Psychiatric/Behavioral:  Negative for confusion.      Physical Exam Triage Vital Signs ED Triage Vitals [07/16/23 0816]  Encounter Vitals Group     BP (!) 151/90     Systolic BP Percentile      Diastolic BP Percentile      Pulse Rate (!) 59     Resp 16     Temp 98.4 F (36.9 C)     Temp Source Oral     SpO2 98 %     Weight      Height      Head Circumference      Peak Flow      Pain Score      Pain Loc      Pain Education      Exclude from Growth Chart    No data  found.  Updated Vital Signs BP (!) 151/90 (BP Location: Left Arm)   Pulse (!) 59   Temp 98.4 F (36.9 C) (Oral)   Resp 16   LMP 07/14/2023  SpO2 98%   Visual Acuity Right Eye Distance:   Left Eye Distance:   Bilateral Distance:    Right Eye Near:   Left Eye Near:    Bilateral Near:     Physical Exam Vitals reviewed.  Constitutional:      General: She is not in acute distress.    Appearance: Normal appearance. She is normal weight. She is not ill-appearing, toxic-appearing or diaphoretic.  HENT:     Head: Normocephalic and atraumatic.  Eyes:     General: No scleral icterus.    Extraocular Movements: Extraocular movements intact.     Pupils: Pupils are equal, round, and reactive to light.  Cardiovascular:     Rate and Rhythm: Normal rate and regular rhythm.  Pulmonary:     Effort: Pulmonary effort is normal.  Abdominal:     General: Bowel sounds are normal. There is no distension.     Palpations: There is no mass.     Tenderness: There is no abdominal tenderness. There is no guarding or rebound.  Musculoskeletal:     Comments: Range of motion of the ankles is normal bilaterally with 5/5 strength in all directions.  There is some trace edema over the lateral ankle that is nonpitting.  Skin:    Capillary Refill: Capillary refill takes 2 to 3 seconds.     Comments: Multiple small punctate lesions on the lateral and posterior aspects of both ankles right greater than left.  The 2 main lesions over the right lateral ankle between the malleolus and the Achilles tendon have small eschar formation over the puncture wound with healing tissue in between and surrounding erythema.  There is streaking from the erythema towards the anterior ankle and up the lateral aspect.  Tenderness to palpation as well.  Neurological:     General: No focal deficit present.     Mental Status: She is alert.  Psychiatric:        Mood and Affect: Mood normal.        Behavior: Behavior normal.       UC Treatments / Results  Labs (all labs ordered are listed, but only abnormal results are displayed) Labs Reviewed - No data to display  EKG   Radiology No results found.  Procedures Procedures (including critical care time)  Medications Ordered in UC Medications - No data to display  Initial Impression / Assessment and Plan / UC Course  I have reviewed the triage vital signs and the nursing notes.  Pertinent labs & imaging results that were available during my care of the patient were reviewed by me and considered in my medical decision making (see chart for details).     Cellulitis secondary to bug bite of the ankle - The patient has localized cellulitis without abscess formation over the right ankle.  There is a small eschar but no signs of ongoing necrosis.  She does however, have some streaking. - We will start her on a course of Keflex.  I counseled her on continuing the antibiotics until the end of the course. - I also advised starting a probiotic given her ongoing diarrhea. - We discussed using good hygiene and petroleum jelly over the wounds to keep them moist for healing and keeping them covered.  She can let them air out in the evening time to avoid maceration. - Avoid scratching. - If she continues to have worsening signs of infection or necrosis she will need to follow-up in the emergency room.  Traveler's diarrhea -  We discussed continuing good oral hydration with electrolyte replacement - No concerns for arterial pathogens at this point.  No blood or mucus in the stool. - I counseled her on the signs/symptoms to monitor for and if no improvement within the next 1-2 weeks she should follow-up with her primary care provider for further testing and possible stool cultures. -The patient voiced understanding of the above and agreement with the plan.  Elevated blood pressure reading -Advised the patient to pick up a blood pressure cuff at home and discussed  the proper ways of taking her blood pressure. - She will start a twice daily log to bring to her primary care provider within the next 1-2 weeks. - If she has consistent elevations she may need pharmacologic therapy.  Final Clinical Impressions(s) / UC Diagnoses   Final diagnoses:  Traveler's diarrhea  Cellulitis of lower extremity, unspecified laterality     Discharge Instructions      You have diarrhea which is likely traveler's diarrhea. Make sure to drink adequate water each day with electrolytes added. If you start to see blood or mucus in her stool return to urgent care, primary care, or the ED I recommend starting a probiotic daily. Start the oral antibiotic and take it until it is finished, even if you feel better. Keep your bug bites clean and then covered with Vaseline and a bandage.  Air them out in the evening when you are able to rest.  Pick up a blood pressure cuff from the store and take your blood pressure twice daily as we discussed. Follow-up with her PCP as soon as possible and bring the blood pressure log with you.     ED Prescriptions     Medication Sig Dispense Auth. Provider   cephALEXin (KEFLEX) 500 MG capsule Take 1 capsule (500 mg total) by mouth 4 (four) times daily for 5 days. 20 capsule Ivor Messier, MD      PDMP not reviewed this encounter.   Ivor Messier, MD 07/16/23 1340

## 2023-07-16 NOTE — Discharge Instructions (Addendum)
You have diarrhea which is likely traveler's diarrhea. Make sure to drink adequate water each day with electrolytes added. If you start to see blood or mucus in her stool return to urgent care, primary care, or the ED I recommend starting a probiotic daily. Start the oral antibiotic and take it until it is finished, even if you feel better. Keep your bug bites clean and then covered with Vaseline and a bandage.  Air them out in the evening when you are able to rest.  Pick up a blood pressure cuff from the store and take your blood pressure twice daily as we discussed. Follow-up with her PCP as soon as possible and bring the blood pressure log with you.

## 2023-07-17 ENCOUNTER — Ambulatory Visit: Payer: Medicaid Other | Admitting: Family Medicine

## 2023-07-17 VITALS — BP 132/78 | HR 78 | Temp 98.1°F | Ht 62.5 in | Wt 202.5 lb

## 2023-07-17 DIAGNOSIS — R42 Dizziness and giddiness: Secondary | ICD-10-CM

## 2023-07-17 DIAGNOSIS — R030 Elevated blood-pressure reading, without diagnosis of hypertension: Secondary | ICD-10-CM | POA: Diagnosis not present

## 2023-07-17 DIAGNOSIS — S90569D Insect bite (nonvenomous), unspecified ankle, subsequent encounter: Secondary | ICD-10-CM | POA: Diagnosis not present

## 2023-07-17 DIAGNOSIS — R002 Palpitations: Secondary | ICD-10-CM

## 2023-07-17 DIAGNOSIS — R197 Diarrhea, unspecified: Secondary | ICD-10-CM | POA: Diagnosis not present

## 2023-07-17 DIAGNOSIS — W57XXXD Bitten or stung by nonvenomous insect and other nonvenomous arthropods, subsequent encounter: Secondary | ICD-10-CM

## 2023-07-17 LAB — CBC
HCT: 42.2 % (ref 36.0–46.0)
Hemoglobin: 14.5 g/dL (ref 12.0–15.0)
MCHC: 34.3 g/dL (ref 30.0–36.0)
MCV: 88.2 fL (ref 78.0–100.0)
Platelets: 266 10*3/uL (ref 150.0–400.0)
RBC: 4.78 Mil/uL (ref 3.87–5.11)
RDW: 12.6 % (ref 11.5–15.5)
WBC: 7.8 10*3/uL (ref 4.0–10.5)

## 2023-07-17 LAB — COMPREHENSIVE METABOLIC PANEL
ALT: 19 U/L (ref 0–35)
AST: 16 U/L (ref 0–37)
Albumin: 4.7 g/dL (ref 3.5–5.2)
Alkaline Phosphatase: 66 U/L (ref 39–117)
BUN: 17 mg/dL (ref 6–23)
CO2: 29 meq/L (ref 19–32)
Calcium: 9.6 mg/dL (ref 8.4–10.5)
Chloride: 103 meq/L (ref 96–112)
Creatinine, Ser: 0.71 mg/dL (ref 0.40–1.20)
GFR: 107.04 mL/min (ref >60.00)
Glucose, Bld: 90 mg/dL (ref 70–99)
Potassium: 4 meq/L (ref 3.5–5.1)
Sodium: 140 meq/L (ref 135–145)
Total Bilirubin: 0.6 mg/dL (ref 0.2–1.2)
Total Protein: 8.2 g/dL (ref 6.0–8.3)

## 2023-07-17 LAB — TSH: TSH: 0.96 u[IU]/mL (ref 0.35–5.50)

## 2023-07-17 NOTE — Patient Instructions (Addendum)
Keep a record of your blood pressures outside of the office for the next office visit.  No new medications for now. I suspect stress of office visits and health concerns may be contributing.   I will check labs today and we can discuss further plan for dizziness at your next visit as it has been going on for a few months.  If any new chest pains, worsening symptoms or new symptoms be seen right away including emergency room if needed.  Diarrhea may still be traveler's diarrhea and with improvement over the past 2 days I am hesitant to check stool cultures or antibiotics at this time.  However if symptoms or not continuing to improve into next week then we should do a stool test to decide if other treatment is needed.  Keep wounds on ankles clean with soap and water, continue antibiotic that was prescribed yesterday, and I will call you with further information and plans in the next day or 2.  Return to the clinic or go to the nearest emergency room if any of your symptoms worsen or new symptoms occur.  Diarrhea, Adult Diarrhea is frequent loose and sometimes watery bowel movements. Diarrhea can make you feel weak and cause you to become dehydrated. Dehydration is a condition in which there is not enough water or other fluids in the body. Dehydration can make you tired and thirsty, cause you to have a dry mouth, and decrease how often you urinate. Diarrhea typically lasts 2-3 days. However, it can last longer if it is a sign of something more serious. It is important to treat your diarrhea as told by your health care provider. Follow these instructions at home: Eating and drinking     Follow these recommendations as told by your health care provider: Take an oral rehydration solution (ORS). This is an over-the-counter medicine that helps return your body to its normal balance of nutrients and water. It is found at pharmacies and retail stores. Drink enough fluid to keep your urine pale  yellow. Drink fluids such as water, diluted fruit juice, and low-calorie sports drinks. You can drink milk also, if desired. Sucking on ice chips is another way to get fluids. Avoid drinking fluids that contain a lot of sugar or caffeine, such as soda, energy drinks, and regular sports drinks. Avoid alcohol. Eat bland, easy-to-digest foods in small amounts as you are able. These foods include bananas, applesauce, rice, lean meats, toast, and crackers. Avoid spicy or fatty foods.  Medicines Take over-the-counter and prescription medicines only as told by your health care provider. If you were prescribed antibiotics, take them as told by your health care provider. Do not stop using the antibiotic even if you start to feel better. General instructions  Wash your hands often using soap and water for at least 20 seconds. If soap and water are not available, use hand sanitizer. Others in the household should wash their hands as well. Hands should be washed: After using the toilet or changing a diaper. Before preparing, cooking, or serving food. While caring for a sick person or while visiting someone in a hospital. Rest at home while you recover. Take a warm bath to relieve any burning or pain from frequent diarrhea episodes. Watch your condition for any changes. Contact a health care provider if: You have a fever. Your diarrhea gets worse. You have new symptoms. You vomit every time you eat or drink. You feel light-headed, dizzy, or have a headache. You have muscle cramps. You  have signs of dehydration, such as: Dark urine, very little urine, or no urine. Cracked lips. Dry mouth. Sunken eyes. Sleepiness. Weakness. You have bloody or black stools or stools that look like tar. You have severe pain, cramping, or bloating in your abdomen. Your skin feels cold and clammy. You feel confused. Get help right away if: You have chest pain or your heart is beating very quickly. You have  trouble breathing or you are breathing very quickly. You feel extremely weak or you faint. These symptoms may be an emergency. Get help right away. Call 911. Do not wait to see if the symptoms will go away. Do not drive yourself to the hospital. This information is not intended to replace advice given to you by your health care provider. Make sure you discuss any questions you have with your health care provider. Document Revised: 02/11/2022 Document Reviewed: 02/11/2022 Elsevier Patient Education  2024 [employer]   Dizziness Dizziness is a common problem. It makes you feel unsteady or light-headed. You may feel like you're about to faint. Dizziness can lead to getting hurt if you stumble or fall. It's more common to feel dizzy if you're an older adult. Many things can cause you to feel dizzy. These include: Medicines. Dehydration. This is when there's not enough water in your body. Illness. Follow these instructions at home: Eating and drinking  Drink enough fluid to keep your pee (urine) pale yellow. This helps keep you from getting dehydrated. Try to drink more clear fluids, such as water. Do not drink alcohol. Try to limit how much caffeine you take in. Try to limit how much salt, also called sodium, you take in. Activity Try not to make quick movements. Stand up slowly from sitting in a chair. Steady yourself until you feel okay. In the morning, first sit up on the side of the bed. When you feel okay, hold onto something and slowly stand up. Do this until you know that your balance is okay. If you need to stand in one place for a long time, move your legs often. Tighten and relax the muscles in your legs while you're standing. Do not drive or use machines if you feel dizzy. Avoid bending down if you feel dizzy. Place items in your home so you can reach them without leaning over. Lifestyle Do not smoke, vape, or use products with nicotine or tobacco in them. If you need help  quitting, talk with your health care provider. Try to lower your stress level. You can do this by using methods like yoga or meditation. Talk with your provider if you need help. General instructions Watch your dizziness for any changes. Take your medicines only as told by your provider. Talk with your provider if you think you're dizzy because of a medicine you're taking. Tell a friend or a family member that you're feeling dizzy. If they spot any changes in your behavior, have them call your provider. Contact a health care provider if: Your dizziness doesn't go away, or you have new symptoms. Your dizziness gets worse. You feel like you may vomit. You have trouble hearing. You have a fever. You have neck pain or a stiff neck. You fall or get hurt. Get help right away if: You vomit each time you eat or drink. You have watery poop and can't eat or drink. You have trouble talking, walking, swallowing, or using your arms, hands, or legs. You feel very weak. You're bleeding. You're not thinking clearly, or you have  trouble forming sentences. A friend or family member may spot this. Your vision changes, or you get a very bad headache. These symptoms may be an emergency. Call 911 right away. Do not wait to see if the symptoms will go away. Do not drive yourself to the hospital. This information is not intended to replace advice given to you by your health care provider. Make sure you discuss any questions you have with your health care provider. Document Revised: 10/09/2022 Document Reviewed: 10/09/2022 Elsevier Patient Education  2024 [employer].

## 2023-07-17 NOTE — Progress Notes (Unsigned)
Subjective:  Patient ID: Dana Lindsey, female    DOB: 1986/01/06  Age: 37 y.o. MRN: [id number]  CC:  Chief Complaint  Patient presents with   Diarrhea    Pt has had diarrhea for 11 days, no normal BM, cramping after eating    BLOOD TEST    Pt is requesting check on sugars due to hard to heal sores    Sore    Pt notes recent trip to [place] where she got some bug bites to both ankles she wants checked out and was also told while in the UC for bug bites yesterday they noted high BP,  UC gave her abx yesterday and told her to see PCP     HPI Dana Lindsey presents for   Multiple concerns above.    Dizziness: Past few months. No syncope. Occasional heart racing with dizziness.  Slight pain in chest at times with dizziness, but minimal, some shortness of breath. Denies anxiety with dizziness. Some sense of room spinning or off balance but also lightheaded at times.  Some dark stools at times, no blood in stools. Hemorrhoids in past, painful with recent diarrhea. Has cream from surgery - has been using daily.   Lab Results  Component Value Date   WBC 8.1 06/12/2021   HGB 13.4 06/12/2021   HCT 38.3 06/12/2021   MCV 84 06/12/2021   PLT 261 06/12/2021   Diarrhea Diagnosed with traveler's diarrhea at urgent care yesterday.  Started prior to her leaving [place]. Day after bites on ankles. Starts with cramping, then diarrhea. 2 times per day last week.  No fever, blood or mucus within stools. 3 days ago - 4-5 episodes, 5 on on Wednesday (2 days ago). Better yesterday - 3 episodes. 2 episodes today. continued symptomatic care was recommended from urgent care.   Blood pressure was noted to be elevated at 151/90 at that visit.  20No current antihypertensives. 144/97 in [place].   Insect bite of ankles Bug bites to both ankles when in [place]. [place], noticed itching initially 11 days ago. Returned form [place] on 11/4.  History noted from urgent care.  Swelling of both feet, but no change in color. Photo on phone - see scanned photo below from 10/29.  Treated with allergy med in [place], no abx. Swelling improved. Squeezed clear d/c from area once only.  Also soaked feet in rosemary. Swelling improvined in 1st week. Continued soreness and dark area, feel like something under skin. Saw urgent care yesterday, started on Keflex 500 mg 4 times daily. Has taken few doses yesterday, none yet today.  Concerned about possible botfly. Pain around area and looked up photo.  Topical abx, and topical va Concerned about blood sugar - past few months feels like it takes longer to heal - cuts or mosquito bite.  Lab Results  Component Value Date   HGBA1C 5.2 04/23/2020  Glucose 100 in 2013.    BP Readings from Last 3 Encounters:  07/17/23 132/78  07/16/23 (!) 151/90  05/12/23 127/81     History There are no problems to display for this patient.  Past Medical History:  Diagnosis Date   Asthma    Phreesia 03/06/2020   Cholecystolithiasis    Ear infection    Gastritis    Grade II hemorrhoids    Hyperplastic colon polyp    Shortness of breath    with exertion   Past Surgical History:  Procedure Laterality  Date   CHOLECYSTECTOMY  08/23/2012   Procedure: LAPAROSCOPIC CHOLECYSTECTOMY WITH INTRAOPERATIVE CHOLANGIOGRAM;  Surgeon: Mariella Saa, MD;  Location: [facility] OR;  Service: General;  Laterality: N/A;   COLONOSCOPY     LIPOSUCTION  03/22/2022   No Known Allergies Prior to Admission medications   Medication Sig Start Date End Date Taking? Authorizing Provider  albuterol (VENTOLIN HFA) 108 (90 Base) MCG/ACT inhaler Inhale 2 puffs into the lungs every 6 (six) hours as needed for wheezing or shortness of breath. 06/16/22  Yes Shade Flood, MD  cephALEXin (KEFLEX) 500 MG capsule Take 1 capsule (500 mg total) by mouth 4 (four) times daily for 5 days. 07/16/23 07/21/23 Yes Ivor Messier, MD  fluticasone (FLONASE) 50 MCG/ACT nasal  spray Place 1 spray into both nostrils daily. 06/16/22  Yes Shade Flood, MD  fluticasone (FLOVENT HFA) 44 MCG/ACT inhaler Inhale 1 puff into the lungs 2 (two) times daily as needed. For asthma flair or increased cough. 06/16/22  Yes Shade Flood, MD  hydrocortisone cream 1 % Apply to affected area 2 times daily 04/02/23  Yes Stanhope, Donavan Burnet, FNP  levonorgestrel (LILETTA, 52 MG,) 20.1 MCG/DAY IUD IUD 1 each by Intrauterine route once.   Yes [provider]  Multiple Vitamins-Minerals (HAIR/SKIN/NAILS/BIOTIN PO) Take 2 tablets by mouth daily.   Yes [provider]  Multiple Vitamins-Minerals (MULTIVITAMIN ADULT) CHEW Chew 2 tablets by mouth daily.   Yes [provider]  zinc oxide (MEIJER ZINC OXIDE) 20 % ointment Apply 1 Application topically as needed for irritation. 04/02/23  Yes Stanhope, Donavan Burnet, FNP  AMBULATORY NON FORMULARY MEDICATION 2% Diltiazem gel with 5% lidocaine  Apply a pea sized amount into your rectum three times daily for 6 weeks Dispense 30 GM zero refill Patient not taking: Reported on 07/17/2023 06/17/22   Imogene Burn, MD  clotrimazole (LOTRIMIN) 1 % cream Apply to affected area 2 times daily Patient not taking: Reported on 07/17/2023 04/02/23   Carlisle Beers, FNP  clotrimazole-betamethasone (LOTRISONE) cream Apply 1 Application topically 2 (two) times daily. Patient not taking: Reported on 07/17/2023 04/29/23   Levie Heritage, DO  fluconazole (DIFLUCAN) 150 MG tablet Take 1 tablet (150 mg total) by mouth every 3 (three) days. Patient not taking: Reported on 07/17/2023 04/02/23   Carlisle Beers, FNP   Social History   Socioeconomic History   Marital status: Married    Spouse name: Not on file   Number of children: 4   Years of education: Not on file   Highest education level: Not on file  Occupational History   Not on file  Tobacco Use   Smoking status: Never   Smokeless tobacco: Never  Vaping Use   Vaping  status: Never Used  Substance and Sexual Activity   Alcohol use: No    Comment: rare   Drug use: No   Sexual activity: Yes    Birth control/protection: I.U.D.  Other Topics Concern   Not on file  Social History Narrative   Not on file   Social Determinants of Health   Financial Resource Strain: Low Risk  (12/02/2018)   Overall Financial Resource Strain (CARDIA)    Difficulty of Paying Living Expenses: Not hard at all  Food Insecurity: No Food Insecurity (12/02/2018)   Hunger Vital Sign    Worried About Running Out of Food in the Last Year: Never true    Ran Out of Food in the Last Year: Never true  Transportation  Needs: Unknown (12/02/2018)   PRAPARE - Administrator, Civil Service (Medical): No    Lack of Transportation (Non-Medical): Not on file  Physical Activity: Not on file  Stress: Stress Concern Present (12/02/2018)   [employer] of Occupational Health - Occupational Stress Questionnaire    Feeling of Stress : To some extent  Social Connections: Not on file  Intimate Partner Violence: Not At Risk (12/02/2018)   Humiliation, Afraid, Rape, and Kick questionnaire    Fear of Current or Ex-Partner: No    Emotionally Abused: No    Physically Abused: No    Sexually Abused: No    Review of Systems Per HPI.   Objective:   Vitals:   07/17/23 1128 07/17/23 1209  BP: (!) 148/80 132/78  Pulse: 78   Temp: 98.1 F (36.7 C)   TempSrc: Temporal   SpO2: 97%   Weight: 202 lb 8 oz (91.9 kg)   Height: 5' 2.5" (1.588 m)      Physical Exam Vitals reviewed.  Constitutional:      Appearance: Normal appearance. She is well-developed.  HENT:     Head: Normocephalic and atraumatic.  Eyes:     Conjunctiva/sclera: Conjunctivae normal.     Pupils: Pupils are equal, round, and reactive to light.  Neck:     Vascular: No carotid bruit.  Cardiovascular:     Rate and Rhythm: Normal rate and regular rhythm.     Heart sounds: Normal heart sounds.  Pulmonary:      Effort: Pulmonary effort is normal.     Breath sounds: Normal breath sounds.  Abdominal:     General: Abdomen is flat. There is no distension.     Palpations: Abdomen is soft. There is no pulsatile mass.     Tenderness: There is no abdominal tenderness.  Musculoskeletal:     Right lower leg: No edema.     Left lower leg: No edema.  Skin:    General: Skin is warm and dry.  Neurological:     Mental Status: She is alert and oriented to person, place, and time.  Psychiatric:        Mood and Affect: Mood normal.        Behavior: Behavior normal.    EKG, sinus rhythm, rate 64.  Compared to December 2013, rate 64 at that time as well.  Slight slowing of rate during EKG to approximately 60.  No acute ST or T wave changes appreciated.  No acute changes appreciated from 2013.       Assessment & Plan:  DAMETRA HEFFERN is a 37 y.o. female . Dizzy spells - Plan: EKG 12-Lead, Comp Met (CMET), CBC, TSH  Diarrhea, unspecified type  Insect bite of ankle, unspecified laterality, subsequent encounter  Elevated blood pressure reading   No orders of the defined types were placed in this encounter.  Patient Instructions  Keep a record of your blood pressures outside of the office for the next office visit.  No new medications for now. I suspect stress of office visits and health concerns may be contributing.   I will check labs today and we can discuss further plan for dizziness at your next visit as it has been going on for a few months.  If any new chest pains, worsening symptoms or new symptoms be seen right away including emergency room if needed.  Diarrhea may still be traveler's diarrhea and with improvement over the past 2 days I am hesitant to check stool  cultures or antibiotics at this time.  However if symptoms or not continuing to improve into next week then we should do a stool test to decide if other treatment is needed.  Keep wounds on ankles clean with soap and water, continue  antibiotic that was prescribed yesterday, and I will call you with further information and plans in the next day or 2.  Return to the clinic or go to the nearest emergency room if any of your symptoms worsen or new symptoms occur.  Diarrhea, Adult Diarrhea is frequent loose and sometimes watery bowel movements. Diarrhea can make you feel weak and cause you to become dehydrated. Dehydration is a condition in which there is not enough water or other fluids in the body. Dehydration can make you tired and thirsty, cause you to have a dry mouth, and decrease how often you urinate. Diarrhea typically lasts 2-3 days. However, it can last longer if it is a sign of something more serious. It is important to treat your diarrhea as told by your health care provider. Follow these instructions at home: Eating and drinking     Follow these recommendations as told by your health care provider: Take an oral rehydration solution (ORS). This is an over-the-counter medicine that helps return your body to its normal balance of nutrients and water. It is found at pharmacies and retail stores. Drink enough fluid to keep your urine pale yellow. Drink fluids such as water, diluted fruit juice, and low-calorie sports drinks. You can drink milk also, if desired. Sucking on ice chips is another way to get fluids. Avoid drinking fluids that contain a lot of sugar or caffeine, such as soda, energy drinks, and regular sports drinks. Avoid alcohol. Eat bland, easy-to-digest foods in small amounts as you are able. These foods include bananas, applesauce, rice, lean meats, toast, and crackers. Avoid spicy or fatty foods.  Medicines Take over-the-counter and prescription medicines only as told by your health care provider. If you were prescribed antibiotics, take them as told by your health care provider. Do not stop using the antibiotic even if you start to feel better. General instructions  Wash your hands often using  soap and water for at least 20 seconds. If soap and water are not available, use hand sanitizer. Others in the household should wash their hands as well. Hands should be washed: After using the toilet or changing a diaper. Before preparing, cooking, or serving food. While caring for a sick person or while visiting someone in a hospital. Rest at home while you recover. Take a warm bath to relieve any burning or pain from frequent diarrhea episodes. Watch your condition for any changes. Contact a health care provider if: You have a fever. Your diarrhea gets worse. You have new symptoms. You vomit every time you eat or drink. You feel light-headed, dizzy, or have a headache. You have muscle cramps. You have signs of dehydration, such as: Dark urine, very little urine, or no urine. Cracked lips. Dry mouth. Sunken eyes. Sleepiness. Weakness. You have bloody or black stools or stools that look like tar. You have severe pain, cramping, or bloating in your abdomen. Your skin feels cold and clammy. You feel confused. Get help right away if: You have chest pain or your heart is beating very quickly. You have trouble breathing or you are breathing very quickly. You feel extremely weak or you faint. These symptoms may be an emergency. Get help right away. Call 911. Do not wait to see  if the symptoms will go away. Do not drive yourself to the hospital. This information is not intended to replace advice given to you by your health care provider. Make sure you discuss any questions you have with your health care provider. Document Revised: 02/11/2022 Document Reviewed: 02/11/2022 Elsevier Patient Education  2024 Elsevier Inc.   Dizziness Dizziness is a common problem. It makes you feel unsteady or light-headed. You may feel like you're about to faint. Dizziness can lead to getting hurt if you stumble or fall. It's more common to feel dizzy if you're an older adult. Many things can cause you  to feel dizzy. These include: Medicines. Dehydration. This is when there's not enough water in your body. Illness. Follow these instructions at home: Eating and drinking  Drink enough fluid to keep your pee (urine) pale yellow. This helps keep you from getting dehydrated. Try to drink more clear fluids, such as water. Do not drink alcohol. Try to limit how much caffeine you take in. Try to limit how much salt, also called sodium, you take in. Activity Try not to make quick movements. Stand up slowly from sitting in a chair. Steady yourself until you feel okay. In the morning, first sit up on the side of the bed. When you feel okay, hold onto something and slowly stand up. Do this until you know that your balance is okay. If you need to stand in one place for a long time, move your legs often. Tighten and relax the muscles in your legs while you're standing. Do not drive or use machines if you feel dizzy. Avoid bending down if you feel dizzy. Place items in your home so you can reach them without leaning over. Lifestyle Do not smoke, vape, or use products with nicotine or tobacco in them. If you need help quitting, talk with your health care provider. Try to lower your stress level. You can do this by using methods like yoga or meditation. Talk with your provider if you need help. General instructions Watch your dizziness for any changes. Take your medicines only as told by your provider. Talk with your provider if you think you're dizzy because of a medicine you're taking. Tell a friend or a family member that you're feeling dizzy. If they spot any changes in your behavior, have them call your provider. Contact a health care provider if: Your dizziness doesn't go away, or you have new symptoms. Your dizziness gets worse. You feel like you may vomit. You have trouble hearing. You have a fever. You have neck pain or a stiff neck. You fall or get hurt. Get help right away if: You  vomit each time you eat or drink. You have watery poop and can't eat or drink. You have trouble talking, walking, swallowing, or using your arms, hands, or legs. You feel very weak. You're bleeding. You're not thinking clearly, or you have trouble forming sentences. A friend or family member may spot this. Your vision changes, or you get a very bad headache. These symptoms may be an emergency. Call 911 right away. Do not wait to see if the symptoms will go away. Do not drive yourself to the hospital. This information is not intended to replace advice given to you by your health care provider. Make sure you discuss any questions you have with your health care provider. Document Revised: 10/09/2022 Document Reviewed: 10/09/2022 Elsevier Patient Education  2024 Elsevier Inc.     Signed,   Meredith Staggers, MD [facility], [facility] 07/17/23 1:30 PM

## 2023-07-18 ENCOUNTER — Encounter: Payer: Self-pay | Admitting: Family Medicine

## 2023-07-21 NOTE — Progress Notes (Unsigned)
Cardiology Office Note:   Date:  07/22/2023  ID:  [name], DOB [birth year], MRN [id number] PCP:  Shade Flood, MD  [facility] Providers Cardiologist:  Alverda Skeans, MD Referring MD: Shade Flood, MD  Chief Complaint/Reason for Referral: Palpitations and dizziness ASSESSMENT:    1. Palpitations   2. BMI 36.0-36.9,adult   3. Precordial pain     PLAN:   In order of problems listed above: Palpitations: Will obtain echocardiogram and monitor to evaluate further.  Her orthostatic blood pressures were not consistent with orthostatic hypotension or POTS syndrome. Elevated BMI: Diet and exercise modification; consider GLP-1 receptor agonist therapy. Chest pain:  We will obtain a coronary CTA and echocardiogram to evaluate further.  If the patient has mild obstructive coronary artery disease, they will require a statin (with goal LDL < 70) and aspirin, if they have high-grade disease we will need to consider optimal medical therapy and if symptoms are refractory to medical therapy, then a cardiac catheterization with possible PCI will be pursued to alleviate symptoms.  If they have high risk disease we will proceed directly to cardiac catheterization.               Dispo:  No follow-ups on file.      Medication Adjustments/Labs and Tests Ordered: Current medicines are reviewed at length with the patient today.  Concerns regarding medicines are outlined above.  The following changes have been made:  no change   Labs/tests ordered: Orders Placed This Encounter  Procedures   CT CORONARY MORPH W/CTA COR W/SCORE W/CA W/CM &/OR WO/CM   LONG TERM MONITOR (3-14 DAYS)   EKG 12-Lead   ECHOCARDIOGRAM COMPLETE    Medication Changes: Meds ordered this encounter  Medications   metoprolol tartrate (LOPRESSOR) 100 MG tablet    Sig: Take 1 tablet (100 mg total) by mouth once for 1 dose. Take 90-120 minutes prior to scan.    Dispense:  1 tablet    Refill:  0     Current medicines are reviewed at length with the patient today.  The patient does not have concerns regarding medicines.    History of Present Illness:      FOCUSED PROBLEM LIST:   BMI 36  The patient is a 37 year old female with the above listed medical problems who is here for recommendations regarding palpitations and dizziness.  The patient has been having diarrhea for several days.  She recently returned from a trip to [place].  She was seen in the emergency department and diagnosed with traveler's diarrhea.  She reported that she occasionally develops dizziness and lightheadedness along with occasional heart racing.  She has noticed almost daily dizziness.  This can occur when she goes from sitting to standing or when she moves her head in a certain way.  This is also associated with palpitations.  Additionally she is noticed a chest discomfort at times.  He can come on when she really hurries in the grocery store.  She will have to stop and catch her breath and it will get better.  She fortunately has not developed any frank syncope.  She reports some swelling in her legs especially in the morning.  She does not smoke.  She has 5 children and also works.          Current Medications: Current Meds  Medication Sig   metoprolol tartrate (LOPRESSOR) 100 MG tablet Take 1 tablet (100 mg total) by mouth once for 1 dose. Take 90-120 minutes  prior to scan.     Review of Systems:   Please see the history of present illness.    All other systems reviewed and are negative.     EKGs/Labs/Other Test Reviewed:   EKG:    EKG Interpretation Date/Time:  Wednesday July 22 2023 09:31:38 EST Ventricular Rate:  64 PR Interval:  148 QRS Duration:  92 QT Interval:  410 QTC Calculation: 422 R Axis:   7  Text Interpretation: Normal sinus rhythm Normal ECG When compared with ECG of 22-Aug-2012 22:16, No significant change was found Confirmed by Alverda Skeans ([id number]) on 07/22/2023  9:41:18 AM         Risk Assessment/Calculations:          Physical Exam:   VS:  BP 130/85   Pulse 67   Ht 5\' 3"  (1.6 m)   Wt 204 lb 3.2 oz (92.6 kg)   LMP 07/14/2023   SpO2 97%   BMI 36.17 kg/m        Wt Readings from Last 3 Encounters:  07/22/23 204 lb 3.2 oz (92.6 kg)  07/17/23 202 lb 8 oz (91.9 kg)  05/12/23 199 lb 4.8 oz (90.4 kg)      GENERAL:  No apparent distress, AOx3 HEENT:  No carotid bruits, +2 carotid impulses, no scleral icterus CAR: RRR no murmurs, gallops, rubs, or thrills RES:  Clear to auscultation bilaterally ABD:  Soft, nontender, nondistended, positive bowel sounds x 4 VASC:  +2 radial pulses, +2 carotid pulses NEURO:  CN 2-12 grossly intact; motor and sensory grossly intact PSYCH:  No active depression or anxiety EXT:  No edema, ecchymosis, or cyanosis  Signed, Orbie Pyo, MD  07/22/2023 10:26 AM    [facility] [address] [place], [place], [place]  [address] Phone: [phone]; Fax: [phone]   Note:  This document was prepared using Dragon voice recognition software and may include unintentional dictation errors.

## 2023-07-22 ENCOUNTER — Ambulatory Visit: Payer: Medicaid Other | Attending: Internal Medicine | Admitting: Internal Medicine

## 2023-07-22 ENCOUNTER — Ambulatory Visit (INDEPENDENT_AMBULATORY_CARE_PROVIDER_SITE_OTHER): Payer: Medicaid Other

## 2023-07-22 VITALS — BP 130/85 | HR 67 | Ht 63.0 in | Wt 204.2 lb

## 2023-07-22 DIAGNOSIS — Z6836 Body mass index (BMI) 36.0-36.9, adult: Secondary | ICD-10-CM

## 2023-07-22 DIAGNOSIS — R002 Palpitations: Secondary | ICD-10-CM

## 2023-07-22 DIAGNOSIS — R072 Precordial pain: Secondary | ICD-10-CM

## 2023-07-22 MED ORDER — METOPROLOL TARTRATE 100 MG PO TABS: 100.0000 mg | ORAL_TABLET | Freq: Once | ORAL | 0 refills | Status: DC

## 2023-07-22 NOTE — Patient Instructions (Addendum)
Medication Instructions:  No changes *If you need a refill on your cardiac medications before your next appointment, please call your pharmacy*   Lab Work: none   Testing/Procedures: Your physician has requested that you have an echocardiogram. Echocardiography is a painless test that uses sound waves to create images of your heart. It provides your doctor with information about the size and shape of your heart and how well your heart's chambers and valves are working. This procedure takes approximately one hour. There are no restrictions for this procedure. Please do NOT wear cologne, perfume, aftershave, or lotions (deodorant is allowed). Please arrive 15 minutes prior to your appointment time.  Please note: We ask at that you not bring children with you during ultrasound (echo/ vascular) testing. Due to room size and safety concerns, children are not allowed in the ultrasound rooms during exams. Our front office staff cannot provide observation of children in our lobby area while testing is being conducted. An adult accompanying a patient to their appointment will only be allowed in the ultrasound room at the discretion of the ultrasound technician under special circumstances. We apologize for any inconvenience.  Zio heart patch - 3 days  Coronary CT Angiogram - see instructions below    Follow-Up: As needed    Your cardiac CT will be scheduled at  [facility] [address] [place], [place] [address] [phone]  Please arrive at the [facility] Children's Entrance (Entrance C2) of [facility] 30 minutes prior to test start time. You can use the FREE valet parking offered at entrance C (encouraged to control the heart rate for the test)  Proceed to the [facility] Radiology Department (first floor) to check-in and test prep.  All radiology patients and guests should use entrance C2 at [facility], accessed from [facility], even though  the hospital's physical address listed is [address]      Please follow these instructions carefully (unless otherwise directed):  An IV will be required for this test and Nitroglycerin will be given.   On the Night Before the Test: Be sure to Drink plenty of water. Do not consume any caffeinated/decaffeinated beverages or chocolate 12 hours prior to your test. Do not take any antihistamines 12 hours prior to your test.  On the Day of the Test: Drink plenty of water until 1 hour prior to the test. Do not eat any food 1 hour prior to test. You may take your regular medications prior to the test.  Take metoprolol (Lopressor) two hours prior to test. FEMALES- please wear underwire-free bra if available, avoid dresses & tight clothing      After the Test: Drink plenty of water. After receiving IV contrast, you may experience a mild flushed feeling. This is normal. On occasion, you may experience a mild rash up to 24 hours after the test. This is not dangerous. If this occurs, you can take Benadryl 25 mg and increase your fluid intake. If you experience trouble breathing, this can be serious. If it is severe call 911 IMMEDIATELY. If it is mild, please call our office. If you take any of these medications: Glipizide/Metformin, Avandament, Glucavance, please do not take 48 hours after completing test unless otherwise instructed.  We will call to schedule your test 2-4 weeks out understanding that some insurance companies will need an authorization prior to the service being performed.   For more information and frequently asked questions, please visit our website :  [email or web address]  For non-scheduling related questions, please contact the cardiac imaging nurse navigator should you have any questions/concerns: Cardiac Imaging Nurse Navigators Direct Office Dial: [phone]   For scheduling needs, including cancellations and rescheduling, please call  [place], [phone].

## 2023-07-22 NOTE — Progress Notes (Unsigned)
Serial # [id number] ZIO XT from office inventory applied to patient.

## 2023-07-23 ENCOUNTER — Telehealth (INDEPENDENT_AMBULATORY_CARE_PROVIDER_SITE_OTHER): Payer: Medicaid Other | Admitting: Family Medicine

## 2023-07-23 DIAGNOSIS — I10 Essential (primary) hypertension: Secondary | ICD-10-CM | POA: Diagnosis not present

## 2023-07-23 DIAGNOSIS — R42 Dizziness and giddiness: Secondary | ICD-10-CM | POA: Diagnosis not present

## 2023-07-23 DIAGNOSIS — R55 Syncope and collapse: Secondary | ICD-10-CM

## 2023-07-23 DIAGNOSIS — S90569D Insect bite (nonvenomous), unspecified ankle, subsequent encounter: Secondary | ICD-10-CM

## 2023-07-23 DIAGNOSIS — W57XXXD Bitten or stung by nonvenomous insect and other nonvenomous arthropods, subsequent encounter: Secondary | ICD-10-CM

## 2023-07-23 DIAGNOSIS — R519 Headache, unspecified: Secondary | ICD-10-CM

## 2023-07-23 MED ORDER — MECLIZINE HCL 25 MG PO TABS
25.0000 mg | ORAL_TABLET | Freq: Three times a day (TID) | ORAL | 0 refills | Status: AC | PRN
Start: 2023-07-23 — End: ?

## 2023-07-23 MED ORDER — AMLODIPINE BESYLATE 2.5 MG PO TABS: 2.5000 mg | ORAL_TABLET | Freq: Every day | ORAL | 1 refills | Status: DC

## 2023-07-23 NOTE — Patient Instructions (Addendum)
Blood pressure is borderline elevated.  If remains over 140 at home, start amlodipine 2.5mg  once per day. If dizziness worsens on meds, no not take again. Keep follow up for studies as planned with cardiology.   You can try meclizine to see if that helps with dizziness - especially the room spinning feeling. I also ordered a CT scan with the associated headaches.   Return to the clinic or go to the nearest emergency room if any of your symptoms worsen or new symptoms occur.  Leg wounds appear to be improving. If any worsening symptoms be seen.   Recheck in 2 weeks.   Return to the clinic or go to the nearest emergency room if any of your symptoms worsen or new symptoms occur.  How to Take Your Blood Pressure Blood pressure is a measurement of how strongly your blood is pressing against the walls of your arteries. Arteries are blood vessels that carry blood from your heart throughout your body. Your health care provider takes your blood pressure at each office visit. You can also take your own blood pressure at home with a blood pressure monitor. You may need to take your own blood pressure to: Confirm a diagnosis of high blood pressure (hypertension). Monitor your blood pressure over time. Make sure your blood pressure medicine is working. Supplies needed: Blood pressure monitor. A chair to sit in. This should be a chair where you can sit upright with your back supported. Do not sit on a soft couch or an armchair. Table or desk. Small notebook and pencil or pen. How to prepare To get the most accurate reading, avoid the following for 30 minutes before you check your blood pressure: Drinking caffeine. Drinking alcohol. Eating. Smoking. Exercising. Five minutes before you check your blood pressure: Use the bathroom and urinate so that you have an empty bladder. Sit quietly in a chair. Do not talk. How to take your blood pressure To check your blood pressure, follow the instructions in  the manual that came with your blood pressure monitor. If you have a digital blood pressure monitor, the instructions may be as follows: Sit up straight in a chair. Place your feet on the floor. Do not cross your ankles or legs. Rest your left arm at the level of your heart on a table or desk or on the arm of a chair. Pull up your shirt sleeve. Wrap the blood pressure cuff around the upper part of your left arm, 1 inch (2.5 cm) above your elbow. It is best to wrap the cuff around bare skin. Fit the cuff snugly, but not too tightly, around your arm. You should be able to place only one finger between the cuff and your arm. Position the cord so that it rests in the bend of your elbow. Press the power button. Sit quietly while the cuff inflates and deflates. Read the digital reading on the monitor screen and write the numbers down (record them) in a notebook. Wait 2-3 minutes, then repeat the steps, starting at step 1. What does my blood pressure reading mean? A blood pressure reading consists of a higher number over a lower number. Ideally, your blood pressure should be below 120/80. The first ("top") number is called the systolic pressure. It is a measure of the pressure in your arteries as your heart beats. The second ("bottom") number is called the diastolic pressure. It is a measure of the pressure in your arteries as the heart relaxes. Blood pressure is classified into  four stages. The following are the stages for adults who do not have a short-term serious illness or a chronic condition. Systolic pressure and diastolic pressure are measured in a unit called mm Hg (millimeters of mercury).  Normal Systolic pressure: below 120. Diastolic pressure: below 80. Elevated Systolic pressure: 120-129. Diastolic pressure: below 80. Hypertension stage 1 Systolic pressure: 130-139. Diastolic pressure: 80-89. Hypertension stage 2 Systolic pressure: 140 or above. Diastolic pressure: 90 or above. You  can have elevated blood pressure or hypertension even if only the systolic or only the diastolic number in your reading is higher than normal. Follow these instructions at home: Medicines Take over-the-counter and prescription medicines only as told by your health care provider. Tell your health care provider if you are having any side effects from blood pressure medicine. General instructions Check your blood pressure as often as recommended by your health care provider. Check your blood pressure at the same time every day. Take your monitor to the next appointment with your health care provider to make sure that: You are using it correctly. It provides accurate readings. Understand what your goal blood pressure numbers are. Keep all follow-up visits. This is important. General tips Your health care provider can suggest a reliable monitor that will meet your needs. There are several types of home blood pressure monitors. Choose a monitor that has an arm cuff. Do not choose a monitor that measures your blood pressure from your wrist or finger. Choose a cuff that wraps snugly, not too tight or too loose, around your upper arm. You should be able to fit only one finger between your arm and the cuff. You can buy a blood pressure monitor at most drugstores or online. Where to find more information American Heart Association: www.heart.org Contact a health care provider if: Your blood pressure is consistently high. Your blood pressure is suddenly low. Get help right away if: Your systolic blood pressure is higher than 180. Your diastolic blood pressure is higher than 120. These symptoms may be an emergency. Get help right away. Call 911. Do not wait to see if the symptoms will go away. Do not drive yourself to the hospital. Summary Blood pressure is a measurement of how strongly your blood is pressing against the walls of your arteries. A blood pressure reading consists of a higher number  over a lower number. Ideally, your blood pressure should be below 120/80. Check your blood pressure at the same time every day. Avoid caffeine, alcohol, smoking, and exercise for 30 minutes prior to checking your blood pressure. These agents can affect the accuracy of the blood pressure reading. This information is not intended to replace advice given to you by your health care provider. Make sure you discuss any questions you have with your health care provider. Document Revised: 05/09/2021 Document Reviewed: 05/09/2021 Elsevier Patient Education  2024 [employer].

## 2023-07-23 NOTE — Progress Notes (Signed)
Virtual Visit via Video Note  I attempted connection with Dana Lindsey on 07/23/23 at 10:09 AM by a video enabled telemedicine application -not online.  Repeat link sent.  And verified that I am speaking with the correct person using two identifiers.  Patient location: home, by self.  My location: office - [place].    I discussed the limitations, risks, security and privacy concerns of performing an evaluation and management service by telephone and the availability of in person appointments. I also discussed with the patient that there may be a patient responsible charge related to this service. The patient expressed understanding and agreed to proceed, consent obtained  Chief complaint:  Chief Complaint  Patient presents with   Hypertension    Had concerns with getting dizzy /lightheaded, notes when she looks to the side then back strait theres a spinning feeling, has seen cardiology and she checked out well, they do have her wearing a heart monitor for 3 days, bp at home has been consistently over 140    Insect Bite    Pt notes swelling has gone down the spots are oozing a clear liquid notes still hurts like a burn but has dulled some     History of Present Illness: Dana Lindsey is a 37 y.o. female  Hypertension: Elevated readings discussed at her last visit.  Intermittent dizzy spells.  Palpitations.  Saw cardiology yesterday.  Plan for echocardiogram and monitor.  Orthostatic blood pressures were not consistent with orthostatic hypotension or POTS syndrome.  Plan for coronary CTA and echo to evaluate chest symptoms further.  Blood pressure 130/85 at her cardiology visit yesterday.  BP readings at home 140's. Zio patch started yesterday.   Concerned that BP  Still having some dizziness, as above noticed spinning feeling - like room spinning, other times dizzy like may fall forward. Associated headache on R side of scalp - on and off for a few months. Daily HA, noticed  when looking to the side and then back. Tylenol for HA helps. Few times per day. Max dosing.   BP Readings from Last 3 Encounters:  07/22/23 130/85  07/17/23 132/78  07/16/23 (!) 151/90   Lab Results  Component Value Date   CREATININE 0.71 07/17/2023    Insect bite/leg wounds See last visit.  Had been treated with Keflex by urgent care for possible secondary cellulitis/infection.  Based on appearance less likely botfly but discussed applying Vaseline to surface.  Finishing antibiotic.  Top of wound peeled off the other day. Looks like scrape now. Only slight burning sensation, occasional pain but better.    There are no problems to display for this patient.  Past Medical History:  Diagnosis Date   Asthma    Phreesia 03/06/2020   Cholecystolithiasis    Ear infection    Gastritis    Grade II hemorrhoids    Hyperplastic colon polyp    Shortness of breath    with exertion   Past Surgical History:  Procedure Laterality Date   CHOLECYSTECTOMY  08/23/2012   Procedure: LAPAROSCOPIC CHOLECYSTECTOMY WITH INTRAOPERATIVE CHOLANGIOGRAM;  Surgeon: Mariella Saa, MD;  Location: [facility] OR;  Service: General;  Laterality: N/A;   COLONOSCOPY     LIPOSUCTION  03/22/2022   No Known Allergies Prior to Admission medications   Medication Sig Start Date End Date Taking? Authorizing Provider  albuterol (VENTOLIN HFA) 108 (90 Base) MCG/ACT inhaler Inhale 2 puffs into the lungs every 6 (six) hours as needed for wheezing or shortness  of breath. 06/16/22   Shade Flood, MD  fluticasone (FLONASE) 50 MCG/ACT nasal spray Place 1 spray into both nostrils daily. 06/16/22   Shade Flood, MD  fluticasone (FLOVENT HFA) 44 MCG/ACT inhaler Inhale 1 puff into the lungs 2 (two) times daily as needed. For asthma flair or increased cough. 06/16/22   Shade Flood, MD  hydrocortisone cream 1 % Apply to affected area 2 times daily 04/02/23   Carlisle Beers, FNP  levonorgestrel (LILETTA, 52 MG,)  20.1 MCG/DAY IUD IUD 1 each by Intrauterine route once.    [provider]  metoprolol tartrate (LOPRESSOR) 100 MG tablet Take 1 tablet (100 mg total) by mouth once for 1 dose. Take 90-120 minutes prior to scan. 07/22/23 07/22/23  Orbie Pyo, MD  Multiple Vitamins-Minerals (HAIR/SKIN/NAILS/BIOTIN PO) Take 2 tablets by mouth daily.    [provider]  Multiple Vitamins-Minerals (MULTIVITAMIN ADULT) CHEW Chew 2 tablets by mouth daily.    [provider]  zinc oxide (MEIJER ZINC OXIDE) 20 % ointment Apply 1 Application topically as needed for irritation. 04/02/23   Carlisle Beers, FNP   Social History   Socioeconomic History   Marital status: Married    Spouse name: Not on file   Number of children: 4   Years of education: Not on file   Highest education level: Not on file  Occupational History   Not on file  Tobacco Use   Smoking status: Never   Smokeless tobacco: Never  Vaping Use   Vaping status: Never Used  Substance and Sexual Activity   Alcohol use: No    Comment: rare   Drug use: No   Sexual activity: Yes    Birth control/protection: I.U.D.  Other Topics Concern   Not on file  Social History Narrative   Not on file   Social Determinants of Health   Financial Resource Strain: Low Risk  (12/02/2018)   Overall Financial Resource Strain (CARDIA)    Difficulty of Paying Living Expenses: Not hard at all  Food Insecurity: No Food Insecurity (12/02/2018)   Hunger Vital Sign    Worried About Running Out of Food in the Last Year: Never true    Ran Out of Food in the Last Year: Never true  Transportation Needs: Unknown (12/02/2018)   PRAPARE - Administrator, Civil Service (Medical): No    Lack of Transportation (Non-Medical): Not on file  Physical Activity: Not on file  Stress: Stress Concern Present (12/02/2018)   [employer] of Occupational Health - Occupational Stress Questionnaire    Feeling of Stress : To some extent   Social Connections: Not on file  Intimate Partner Violence: Not At Risk (12/02/2018)   Humiliation, Afraid, Rape, and Kick questionnaire    Fear of Current or Ex-Partner: No    Emotionally Abused: No    Physically Abused: No    Sexually Abused: No    Observations/Objective: There were no vitals filed for this visit. Speaking in full sentences, no facial droop, normal speech.  Nontoxic appearance.  No apparent distress.  No respiratory distress.  Appropriate responses.  Photos below of right and left ankle with improving, healing appearance compared to last visit.      Assessment and Plan: Daily headache Dizziness Near syncope Vertigo  -Daily headache with associated dizziness.  No focal neurologic symptoms but with new daily headache, dizziness, near syncopal symptoms will check CT head.  Has seen cardiology with Zio patch in place, CT  coronary planned as well as echo.    Low-dose amlodipine for blood pressure as below to see if that may be related to some of her symptoms but stop if any worsening dizziness.  Trial of meclizine for possible vertigo component and consider ENT eval.  2-week follow-up.ER precautions given.  Hypertension, unspecified type Stage I, borderline but persistent elevated readings at home.  Can try low-dose amlodipine, potential side effects discussed.  Stop if any worsening dizziness, handout given on appropriate monitoring of home blood pressures, 2-week follow-up.  ER precautions.  Cardiology workup as above.  Insect bite of ankle, unspecified laterality, subsequent encounter  -Wounds appear to be healing.  RTC/urgent care precautions if any new or worsening symptoms.  Follow Up Instructions:    I discussed the assessment and treatment plan with the patient. The patient was provided an opportunity to ask questions and all were answered. The patient agreed with the plan and demonstrated an understanding of the instructions.   The patient was advised to  call back or seek an in-person evaluation if the symptoms worsen or if the condition fails to improve as anticipated.   Shade Flood, MD

## 2023-08-03 ENCOUNTER — Telehealth (HOSPITAL_COMMUNITY): Payer: Self-pay | Admitting: Emergency Medicine

## 2023-08-03 NOTE — Telephone Encounter (Signed)
Attempted to call patient regarding upcoming cardiac CT appointment. °Left message on voicemail with name and callback number °Dwan Fennel RN Navigator Cardiac Imaging °[facility] °[phone] Office °[phone] Cell ° °

## 2023-08-04 ENCOUNTER — Encounter (HOSPITAL_BASED_OUTPATIENT_CLINIC_OR_DEPARTMENT_OTHER): Payer: Self-pay

## 2023-08-04 ENCOUNTER — Ambulatory Visit (HOSPITAL_BASED_OUTPATIENT_CLINIC_OR_DEPARTMENT_OTHER)
Admission: RE | Admit: 2023-08-04 | Discharge: 2023-08-04 | Disposition: A | Discharge status: Home / Self Care | Payer: Medicaid Other | Source: Ambulatory Visit | Attending: Internal Medicine | Admitting: Internal Medicine

## 2023-08-04 DIAGNOSIS — R072 Precordial pain: Secondary | ICD-10-CM | POA: Diagnosis present

## 2023-08-04 MED ORDER — IOHEXOL 350 MG/ML SOLN
100.0000 mL | Freq: Once | INTRAVENOUS | Status: AC | PRN
  Administered 2023-08-04: 95 mL via INTRAVENOUS

## 2023-08-04 MED ORDER — NITROGLYCERIN 0.4 MG SL SUBL
0.8000 mg | SUBLINGUAL_TABLET | Freq: Once | SUBLINGUAL | Status: AC
  Administered 2023-08-04: 0.8 mg via SUBLINGUAL

## 2023-08-04 NOTE — Progress Notes (Signed)
Pt tolerated exam without incident; vital signs within normal limits; pt denies lightheadedness or dizziness; encouraged to drink caffeine, eat meal; pt ambulatory to lobby steady gait noted

## 2023-08-11 ENCOUNTER — Other Ambulatory Visit: Payer: Medicaid Other

## 2023-08-15 ENCOUNTER — Other Ambulatory Visit: Payer: Self-pay | Admitting: Family Medicine

## 2023-08-15 DIAGNOSIS — I10 Essential (primary) hypertension: Secondary | ICD-10-CM

## 2023-08-24 ENCOUNTER — Ambulatory Visit: Payer: Medicaid Other | Admitting: Family Medicine

## 2023-08-24 ENCOUNTER — Encounter: Payer: Self-pay | Admitting: Family Medicine

## 2023-08-24 VITALS — BP 136/74 | HR 95 | Temp 98.0°F | Ht 63.0 in | Wt 213.4 lb

## 2023-08-24 DIAGNOSIS — R052 Subacute cough: Secondary | ICD-10-CM

## 2023-08-24 DIAGNOSIS — J22 Unspecified acute lower respiratory infection: Secondary | ICD-10-CM | POA: Diagnosis not present

## 2023-08-24 DIAGNOSIS — R32 Unspecified urinary incontinence: Secondary | ICD-10-CM | POA: Diagnosis not present

## 2023-08-24 LAB — POCT URINALYSIS DIPSTICK
Bilirubin, UA: NEGATIVE
Glucose, UA: NEGATIVE
Ketones, UA: NEGATIVE
Leukocytes, UA: NEGATIVE
Nitrite, UA: NEGATIVE
Protein, UA: NEGATIVE
Spec Grav, UA: >=1.03 — AB (ref 1.010–1.025)
Urobilinogen, UA: 0.2 U/dL
pH, UA: 6 (ref 5.0–8.0)

## 2023-08-24 MED ORDER — AZITHROMYCIN 250 MG PO TABS: ORAL_TABLET | ORAL | 0 refills | Status: AC

## 2023-08-24 MED ORDER — BENZONATATE 100 MG PO CAPS: 100.0000 mg | ORAL_CAPSULE | Freq: Three times a day (TID) | ORAL | 0 refills | Status: DC | PRN

## 2023-08-24 NOTE — Patient Instructions (Signed)
Thank you for coming today.  Start azithromycin for cough, as we discussed that medicine still working after you have completed taking the pills.  Tessalon Perles as needed for cough throughout the day.  See other information below.  I suspect there may be a component of stress incontinence, especially with the issues with coughing.  If you have any persistent incomplete bladder emptying or incontinence after the cough has improved, I would recommend meeting with urology or we can follow-up to discuss medications in the meantime.  I would like to follow-up in 1 month to recheck the urine test to make sure the blood has cleared.  If you notice any blood in the urine, new burning or urinary symptoms, please return for recheck sooner.  I do recommend practicing the Kegel exercises we discussed and see information below that may help prevent incontinence.   If you do have some irritation in the genital area from incontinence, a barrier cream like Desitin may be helpful but follow-up if any new or worsening symptoms.  Take care!  Urinary Incontinence Urinary incontinence refers to a condition in which a person is unable to control where and when to pass urine. A person with this condition will urinate involuntarily. This means that the person urinates when he or she does not mean to. What are the causes? This condition may be caused by: Medicines. Infections. Constipation. Overactive bladder muscles. Weak bladder muscles. Weak pelvic floor muscles. These muscles provide support for the bladder, intestine, and, in women, the uterus. Enlarged prostate in men. The prostate is a gland near the bladder. When it gets too big, it can pinch the urethra. With the urethra blocked, the bladder can weaken and lose the ability to empty properly. Surgery. Emotional factors, such as anxiety, stress, or post-traumatic stress disorder (PTSD). Spinal cord injury, nerve injury, or other neurological conditions. Pelvic  organ prolapse. This happens in women when organs move out of place and into the vagina. This movement can prevent the bladder and urethra from working properly. What increases the risk? The following factors may make you more likely to develop this condition: Age. The older you are, the higher the risk. Obesity. Being physically inactive. Pregnancy and childbirth. Menopause. Diseases that affect the nerves or spinal cord. Long-term, or chronic, coughing. This can increase pressure on the bladder and pelvic floor muscles. What are the signs or symptoms? Symptoms may vary depending on the type of urinary incontinence you have. They include: A sudden urge to urinate, and passing urine involuntarily before you can get to a bathroom (urge incontinence). Suddenly passing urine when doing activities that force urine to pass, such as coughing, laughing, exercising, or sneezing (stress incontinence). Needing to urinate often but urinating only a small amount, or constantly dribbling urine (overflow incontinence). Urinating because you cannot get to the bathroom in time due to a physical disability, such as arthritis or injury, or due to a communication or thinking problem, such as Alzheimer's disease (functional incontinence). How is this diagnosed? This condition may be diagnosed based on: Your medical history. A physical exam. Tests, such as: Urine tests. X-rays of your kidney and bladder. Ultrasound. CT scan. Cystoscopy. In this procedure, a health care provider inserts a tube with a light and camera (cystoscope) through the urethra and into the bladder to check for problems. Urodynamic testing. These tests assess how well the bladder, urethra, and sphincter can store and release urine. There are different types of urodynamic tests, and they vary depending on  what the test is measuring. To help diagnose your condition, your health care provider may recommend that you keep a log of when you  urinate and how much you urinate. How is this treated? Treatment for this condition depends on the type of incontinence that you have and its cause. Treatment may include: Lifestyle changes, such as: Quitting smoking. Maintaining a healthy weight. Staying active. Try to get 150 minutes of moderate-intensity exercise every week. Ask your health care provider which activities are safe for you. Eating a healthy diet. Avoid high-fat foods, like fried foods. Avoid refined carbohydrates like white bread and white rice. Limit how much alcohol and caffeine you drink. Increase your fiber intake. Healthy sources of fiber include beans, whole grains, and fresh fruits and vegetables. Behavioral changes, such as: Pelvic floor muscle exercises. Bladder training, such as lengthening the amount of time between bathroom breaks, or using the bathroom at regular intervals. Using techniques to suppress bladder urges. This can include distraction techniques or controlled breathing exercises. Medicines, such as: Medicines to relax the bladder muscles and prevent bladder spasms. Medicines to help slow or prevent the growth of a man's prostate. Botox injections. These can help relax the bladder muscles. Treatments, such as: Using pulses of electricity to help change bladder reflexes (electrical nerve stimulation). For women, using a medical device to prevent urine leaks. This is a small, tampon-like, disposable device that is inserted into the urethra. Injecting collagen or carbon beads (bulking agents) into the urinary sphincter. These can help thicken tissue and close the bladder opening. Surgery. Follow these instructions at home: Lifestyle Limit alcohol and caffeine. These can fill your bladder quickly and irritate it. Keep yourself clean to help prevent odors and skin damage. Ask your health care provider about special skin creams and cleansers that can protect the skin from urine. Consider wearing pads or  adult diapers. Make sure to change them regularly, and always change them right after experiencing incontinence. General instructions Take over-the-counter and prescription medicines only as told by your health care provider. Use the bathroom about every 3-4 hours, even if you do not feel the need to urinate. Try to empty your bladder completely every time. After urinating, wait a minute. Then try to urinate again. Make sure you are in a relaxed position while urinating. If your incontinence is caused by nerve problems, keep a log of the medicines you take and the times you go to the bathroom. Keep all follow-up visits. This is important. Where to find more information [employer]: [employer] Association: www.urologyhealth.org Contact a health care provider if: You have pain that gets worse. Your incontinence gets worse. Get help right away if: You have a fever or chills. You are unable to urinate. You have redness in your groin area or down your legs. Summary Urinary incontinence refers to a condition in which a person is unable to control where and when to pass urine. This condition may be caused by medicines, infection, weak bladder muscles, weak pelvic floor muscles, enlargement of the prostate (in men), or surgery. Factors such as older age, obesity, pregnancy and childbirth, menopause, neurological diseases, and chronic coughing may increase your risk for developing this condition. Types of urinary incontinence include urge incontinence, stress incontinence, overflow incontinence, and functional incontinence. This condition is usually treated first with lifestyle and behavioral changes, such as quitting smoking, eating a healthier diet, and doing regular pelvic floor exercises. Other treatment options include medicines, bulking  agents, medical devices, electrical nerve stimulation, or surgery. This information  is not intended to replace advice given to you by your health care provider. Make sure you discuss any questions you have with your health care provider. Document Revised: 03/30/2020 Document Reviewed: 03/30/2020 Elsevier Patient Education  2024 [employer]   Kegel Exercises  Kegel exercises can help strengthen your pelvic floor muscles. The pelvic floor is a group of muscles that support your rectum, small intestine, and bladder. In females, pelvic floor muscles also help support the uterus. These muscles help you control the flow of urine and stool (feces). Kegel exercises are painless and simple. They do not require any equipment. Your provider may suggest Kegel exercises to: Improve bladder and bowel control. Improve sexual response. Improve weak pelvic floor muscles after surgery to remove the uterus (hysterectomy) or after pregnancy, in females. Improve weak pelvic floor muscles after prostate gland removal or surgery, in males. Kegel exercises involve squeezing your pelvic floor muscles. These are the same muscles you squeeze when you try to stop the flow of urine or keep from passing gas. The exercises can be done while sitting, standing, or lying down, but it is best to vary your position. Ask your health care provider which exercises are safe for you. Do exercises exactly as told by your health care provider and adjust them as directed. Do not begin these exercises until told by your health care provider. Exercises How to do Kegel exercises: Squeeze your pelvic floor muscles tight. You should feel a tight lift in your rectal area. If you are a female, you should also feel a tightness in your vaginal area. Keep your stomach, buttocks, and legs relaxed. Hold the muscles tight for up to 10 seconds. Breathe normally. Relax your muscles for up to 10 seconds. Repeat as told by your health care provider. Repeat this exercise daily as told by your health care provider. Continue to do this  exercise for at least 4-6 weeks, or for as long as told by your health care provider. You may be referred to a physical therapist who can help you learn more about how to do Kegel exercises. Depending on your condition, your health care provider may recommend: Varying how long you squeeze your muscles. Doing several sets of exercises every day. Doing exercises for several weeks. Making Kegel exercises a part of your regular exercise routine. This information is not intended to replace advice given to you by your health care provider. Make sure you discuss any questions you have with your health care provider. Document Revised: 01/03/2021 Document Reviewed: 01/03/2021 Elsevier Patient Education  2024 [employer]   Cough, Adult Coughing is a reflex that clears your throat and airways (respiratory system). It helps heal and protect your lungs. It is normal to cough from time to time. A cough that happens with other symptoms or that lasts a long time may be a sign of a condition that needs treatment. A short-term (acute) cough may only last 2-3 weeks. A long-term (chronic) cough may last 8 or more weeks. Coughing is often caused by: Diseases, such as: An infection of the respiratory system. Asthma or other heart or lung diseases. Gastroesophageal reflux. This is when acid comes back up from the stomach. Breathing in things that irritate your lungs. Allergies. Postnasal drip. This is when mucus runs down the back of your throat. Smoking. Some medicines. Follow these instructions at home: Medicines Take over-the-counter and prescription medicines only as told by your health care  provider. Talk with your provider before you take cough medicine (cough suppressants). Eating and drinking Do not drink alcohol. Avoid caffeine. Drink enough fluid to keep your pee (urine) pale yellow. Lifestyle Avoid cigarette smoke. Do not use any products that contain nicotine or tobacco. These products  include cigarettes, chewing tobacco, and vaping devices, such as e-cigarettes. If you need help quitting, ask your provider. Avoid things that make you cough. These may include perfumes, candles, cleaning products, or campfire smoke. General instructions  Watch for any changes to your cough. Tell your provider about them. Always cover your mouth when you cough. If the air is dry in your bedroom or home, use a cool mist vaporizer or humidifier. If your cough is worse at night, try to sleep in a semi-upright position. Rest as needed. Contact a health care provider if: You have new symptoms, or your symptoms get worse. You cough up pus. You have a fever that does not go away or a cough that does not get better after 2-3 weeks. You cannot control your cough with medicine, and you are losing sleep. You have pain that gets worse or is not helped with medicine. You lose weight for no clear reason. You have night sweats. Get help right away if: You cough up blood. You have trouble breathing. Your heart is beating very fast. These symptoms may be an emergency. Get help right away. Call 911. Do not wait to see if the symptoms will go away. Do not drive yourself to the hospital. This information is not intended to replace advice given to you by your health care provider. Make sure you discuss any questions you have with your health care provider. Document Revised: 04/25/2022 Document Reviewed: 04/25/2022 Elsevier Patient Education  2024 [employer].

## 2023-08-24 NOTE — Progress Notes (Signed)
Subjective:  Patient ID: Dana Lindsey, female    DOB: [birth year]  Age: 37 y.o. MRN: [id number]  CC:  Chief Complaint  Patient presents with   Cough    Pt notes had cough about 2 weeks, dry, will have coughing fits, pt experiences incontinence when coughing    Urinary Incontinence    pt experiences incontinence when coughing and feels she isnt emptying her bladder completely, pt notes no other symptoms (respiratory or urinary), notes the constant moisture from incontinence is irritating her genital area      HPI Dana Lindsey presents for   Cough No symptoms past 2 weeks with intermittent dry coughing fits. No fever. Some gagging but no vomiting. Dry coughing fits at times. Overall cough is the same past 2 weeks.  Dtr has been sick with strep throat. Son had mycoplasma PNA in November for 2 weeks.  No personal sore throat or fever - just cough and chest congestion.  Tx: mucinex once - felt warm sensation with taking that med.    Urinary incontinence Has noticed with coughing episodes recently as above.  Did not experience prior to cough. Has noticed at times with incomplete bladder emptying.  No current prescription treatments.  Does note some irritation to genital area from urinary incontinence. No stool incontinence.  Declines meds for now.  Slight dysuria at times. No new back or abd pain.  No vaginal bleeding.  IUD for contraception.    History There are no active problems to display for this patient.  Past Medical History:  Diagnosis Date   Asthma    [employer] 03/06/2020   Cholecystolithiasis    Ear infection    Gastritis    Grade II hemorrhoids    Hyperplastic colon polyp    Shortness of breath    with exertion   Past Surgical History:  Procedure Laterality Date   CHOLECYSTECTOMY  08/23/2012   Procedure: LAPAROSCOPIC CHOLECYSTECTOMY WITH INTRAOPERATIVE CHOLANGIOGRAM;  Surgeon: Mariella Saa, MD;  Location: [facility] OR;  Service: General;  Laterality:  N/A;   COLONOSCOPY     LIPOSUCTION  03/22/2022   No Known Allergies Prior to Admission medications   Medication Sig Start Date End Date Taking? Authorizing Provider  albuterol (VENTOLIN HFA) 108 (90 Base) MCG/ACT inhaler Inhale 2 puffs into the lungs every 6 (six) hours as needed for wheezing or shortness of breath. 06/16/22   Shade Flood, MD  amLODipine (NORVASC) 2.5 MG tablet Take 1 tablet (2.5 mg total) by mouth daily. 07/23/23   Shade Flood, MD  fluticasone (FLONASE) 50 MCG/ACT nasal spray Place 1 spray into both nostrils daily. 06/16/22   Shade Flood, MD  fluticasone (FLOVENT HFA) 44 MCG/ACT inhaler Inhale 1 puff into the lungs 2 (two) times daily as needed. For asthma flair or increased cough. 06/16/22   Shade Flood, MD  hydrocortisone cream 1 % Apply to affected area 2 times daily 04/02/23   Carlisle Beers, FNP  levonorgestrel (LILETTA, 52 MG,) 20.1 MCG/DAY IUD IUD 1 each by Intrauterine route once.    [provider]  meclizine (ANTIVERT) 25 MG tablet Take 1 tablet (25 mg total) by mouth 3 (three) times daily as needed for dizziness. 07/23/23   Shade Flood, MD  metoprolol tartrate (LOPRESSOR) 100 MG tablet Take 1 tablet (100 mg total) by mouth once for 1 dose. Take 90-120 minutes prior to scan. 07/22/23 07/22/23  Orbie Pyo, MD  Multiple Vitamins-Minerals (HAIR/SKIN/NAILS/BIOTIN PO) Take 2 tablets  by mouth daily.    [provider]  Multiple Vitamins-Minerals (MULTIVITAMIN ADULT) CHEW Chew 2 tablets by mouth daily.    [provider]  zinc oxide (MEIJER ZINC OXIDE) 20 % ointment Apply 1 Application topically as needed for irritation. 04/02/23   Carlisle Beers, FNP   Social History   Socioeconomic History   Marital status: Married    Spouse name: Not on file   Number of children: 4   Years of education: Not on file   Highest education level: Not on file  Occupational History   Not on file  Tobacco Use    Smoking status: Never   Smokeless tobacco: Never  Vaping Use   Vaping status: Never Used  Substance and Sexual Activity   Alcohol use: No    Comment: rare   Drug use: No   Sexual activity: Yes    Birth control/protection: I.U.D.  Other Topics Concern   Not on file  Social History Narrative   Not on file   Social Drivers of Health   Financial Resource Strain: Low Risk  (12/02/2018)   Overall Financial Resource Strain (CARDIA)    Difficulty of Paying Living Expenses: Not hard at all  Food Insecurity: No Food Insecurity (12/02/2018)   Hunger Vital Sign    Worried About Running Out of Food in the Last Year: Never true    Ran Out of Food in the Last Year: Never true  Transportation Needs: Unknown (12/02/2018)   PRAPARE - Administrator, Civil Service (Medical): No    Lack of Transportation (Non-Medical): Not on file  Physical Activity: Not on file  Stress: Stress Concern Present (12/02/2018)   [employer] of Occupational Health - Occupational Stress Questionnaire    Feeling of Stress : To some extent  Social Connections: Not on file  Intimate Partner Violence: Not At Risk (12/02/2018)   Humiliation, Afraid, Rape, and Kick questionnaire    Fear of Current or Ex-Partner: No    Emotionally Abused: No    Physically Abused: No    Sexually Abused: No    Review of Systems Per HPI.   Objective:   Vitals:   08/24/23 0911  BP: 136/74  Pulse: 95  Temp: 98 F (36.7 C)  TempSrc: Temporal  SpO2: 98%  Weight: 213 lb 6.4 oz (96.8 kg)  Height: 5\' 3"  (1.6 m)     Physical Exam Vitals reviewed.  Constitutional:      General: She is not in acute distress.    Appearance: She is well-developed.  HENT:     Head: Normocephalic and atraumatic.     Right Ear: Hearing, tympanic membrane, ear canal and external ear normal.     Left Ear: Hearing, tympanic membrane, ear canal and external ear normal.     Nose: Nose normal.     Mouth/Throat:     Pharynx: No oropharyngeal  exudate or posterior oropharyngeal erythema.  Eyes:     Conjunctiva/sclera: Conjunctivae normal.     Pupils: Pupils are equal, round, and reactive to light.  Cardiovascular:     Rate and Rhythm: Normal rate and regular rhythm.     Heart sounds: Normal heart sounds. No murmur heard. Pulmonary:     Effort: Pulmonary effort is normal. No respiratory distress.     Breath sounds: Normal breath sounds. No wheezing or rhonchi.  Abdominal:     General: Abdomen is flat. There is no distension.     Tenderness: There is no abdominal tenderness.  There is no right CVA tenderness or left CVA tenderness.  Skin:    General: Skin is warm and dry.     Findings: No rash.  Neurological:     Mental Status: She is alert and oriented to person, place, and time.  Psychiatric:        Mood and Affect: Mood normal.        Behavior: Behavior normal.      Results for orders placed or performed in visit on 08/24/23  POCT Urinalysis Dipstick   Collection Time: 08/24/23  9:31 AM  Result Value Ref Range   Color, UA Yellow    Clarity, UA clear    Glucose, UA Negative Negative   Bilirubin, UA Negative    Ketones, UA Negative    Spec Grav, UA >=1.030 (A) 1.010 - 1.025   Blood, UA Moderate    pH, UA 6.0 5.0 - 8.0   Protein, UA Negative Negative   Urobilinogen, UA 0.2 0.2 or 1.0 E.U./dL   Nitrite, UA Negative    Leukocytes, UA Negative Negative   Appearance     Odor       Assessment & Plan:  Mercie Poertner Goldblatt is a 37 y.o. female . LRTI (lower respiratory tract infection) - Plan: benzonatate (TESSALON) 100 MG capsule, azithromycin (ZITHROMAX) 250 MG tablet Subacute cough - Plan: benzonatate (TESSALON) 100 MG capsule, azithromycin (ZITHROMAX) 250 MG tablet  -With persistent coughing fits, viral versus differential including pertussis, sick contact with mycoplasma at home as well as strep pharyngitis but lower respiratory symptoms, throat exam reassuring.  Start azithromycin with potential side effects and  risk discussed, Tessalon Perles as needed for cough, handout given for cough and RTC precautions given.  Urinary incontinence, unspecified type - Plan: POCT Urinalysis Dipstick  -Noted with coughing fit above only.  Suspected primarily stress incontinence, may have component of mixed incontinence.  Minimal urinary symptoms, unlikely infection, hematuria noted on dipstick but no nitrite or LE and no gross hematuria.  Kegel exercises discussed and handout given, medications options discussed but declined at this time.  Recheck in 1 month for repeat urinalysis, consider urology eval if any persistent incontinence symptoms with RTC precautions if new or worsening symptoms in the interim.  Meds ordered this encounter  Medications   benzonatate (TESSALON) 100 MG capsule    Sig: Take 1 capsule (100 mg total) by mouth 3 (three) times daily as needed for cough.    Dispense:  20 capsule    Refill:  0   azithromycin (ZITHROMAX) 250 MG tablet    Sig: Take 2 tablets on day 1, then 1 tablet daily on days 2 through 5    Dispense:  6 tablet    Refill:  0   Patient Instructions  Thank you for coming today.  Start azithromycin for cough, as we discussed that medicine still working after you have completed taking the pills.  Tessalon Perles as needed for cough throughout the day.  See other information below.  I suspect there may be a component of stress incontinence, especially with the issues with coughing.  If you have any persistent incomplete bladder emptying or incontinence after the cough has improved, I would recommend meeting with urology or we can follow-up to discuss medications in the meantime.  I would like to follow-up in 1 month to recheck the urine test to make sure the blood has cleared.  If you notice any blood in the urine, new burning or urinary symptoms, please return for  recheck sooner.  I do recommend practicing the Kegel exercises we discussed and see information below that may help prevent  incontinence.   If you do have some irritation in the genital area from incontinence, a barrier cream like Desitin may be helpful but follow-up if any new or worsening symptoms.  Take care!  Urinary Incontinence Urinary incontinence refers to a condition in which a person is unable to control where and when to pass urine. A person with this condition will urinate involuntarily. This means that the person urinates when he or she does not mean to. What are the causes? This condition may be caused by: Medicines. Infections. Constipation. Overactive bladder muscles. Weak bladder muscles. Weak pelvic floor muscles. These muscles provide support for the bladder, intestine, and, in women, the uterus. Enlarged prostate in men. The prostate is a gland near the bladder. When it gets too big, it can pinch the urethra. With the urethra blocked, the bladder can weaken and lose the ability to empty properly. Surgery. Emotional factors, such as anxiety, stress, or post-traumatic stress disorder (PTSD). Spinal cord injury, nerve injury, or other neurological conditions. Pelvic organ prolapse. This happens in women when organs move out of place and into the vagina. This movement can prevent the bladder and urethra from working properly. What increases the risk? The following factors may make you more likely to develop this condition: Age. The older you are, the higher the risk. Obesity. Being physically inactive. Pregnancy and childbirth. Menopause. Diseases that affect the nerves or spinal cord. Long-term, or chronic, coughing. This can increase pressure on the bladder and pelvic floor muscles. What are the signs or symptoms? Symptoms may vary depending on the type of urinary incontinence you have. They include: A sudden urge to urinate, and passing urine involuntarily before you can get to a bathroom (urge incontinence). Suddenly passing urine when doing activities that force urine to pass, such as  coughing, laughing, exercising, or sneezing (stress incontinence). Needing to urinate often but urinating only a small amount, or constantly dribbling urine (overflow incontinence). Urinating because you cannot get to the bathroom in time due to a physical disability, such as arthritis or injury, or due to a communication or thinking problem, such as Alzheimer's disease (functional incontinence). How is this diagnosed? This condition may be diagnosed based on: Your medical history. A physical exam. Tests, such as: Urine tests. X-rays of your kidney and bladder. Ultrasound. CT scan. Cystoscopy. In this procedure, a health care provider inserts a tube with a light and camera (cystoscope) through the urethra and into the bladder to check for problems. Urodynamic testing. These tests assess how well the bladder, urethra, and sphincter can store and release urine. There are different types of urodynamic tests, and they vary depending on what the test is measuring. To help diagnose your condition, your health care provider may recommend that you keep a log of when you urinate and how much you urinate. How is this treated? Treatment for this condition depends on the type of incontinence that you have and its cause. Treatment may include: Lifestyle changes, such as: Quitting smoking. Maintaining a healthy weight. Staying active. Try to get 150 minutes of moderate-intensity exercise every week. Ask your health care provider which activities are safe for you. Eating a healthy diet. Avoid high-fat foods, like fried foods. Avoid refined carbohydrates like white bread and white rice. Limit how much alcohol and caffeine you drink. Increase your fiber intake. Healthy sources of fiber include beans, whole grains,  and fresh fruits and vegetables. Behavioral changes, such as: Pelvic floor muscle exercises. Bladder training, such as lengthening the amount of time between bathroom breaks, or using the  bathroom at regular intervals. Using techniques to suppress bladder urges. This can include distraction techniques or controlled breathing exercises. Medicines, such as: Medicines to relax the bladder muscles and prevent bladder spasms. Medicines to help slow or prevent the growth of a man's prostate. Botox injections. These can help relax the bladder muscles. Treatments, such as: Using pulses of electricity to help change bladder reflexes (electrical nerve stimulation). For women, using a medical device to prevent urine leaks. This is a small, tampon-like, disposable device that is inserted into the urethra. Injecting collagen or carbon beads (bulking agents) into the urinary sphincter. These can help thicken tissue and close the bladder opening. Surgery. Follow these instructions at home: Lifestyle Limit alcohol and caffeine. These can fill your bladder quickly and irritate it. Keep yourself clean to help prevent odors and skin damage. Ask your health care provider about special skin creams and cleansers that can protect the skin from urine. Consider wearing pads or adult diapers. Make sure to change them regularly, and always change them right after experiencing incontinence. General instructions Take over-the-counter and prescription medicines only as told by your health care provider. Use the bathroom about every 3-4 hours, even if you do not feel the need to urinate. Try to empty your bladder completely every time. After urinating, wait a minute. Then try to urinate again. Make sure you are in a relaxed position while urinating. If your incontinence is caused by nerve problems, keep a log of the medicines you take and the times you go to the bathroom. Keep all follow-up visits. This is important. Where to find more information [employer] of Diabetes and Digestive and Kidney Diseases: CarFli[employer].tn American Urology Association: www.urologyhealth.org Contact a health care  provider if: You have pain that gets worse. Your incontinence gets worse. Get help right away if: You have a fever or chills. You are unable to urinate. You have redness in your groin area or down your legs. Summary Urinary incontinence refers to a condition in which a person is unable to control where and when to pass urine. This condition may be caused by medicines, infection, weak bladder muscles, weak pelvic floor muscles, enlargement of the prostate (in men), or surgery. Factors such as older age, obesity, pregnancy and childbirth, menopause, neurological diseases, and chronic coughing may increase your risk for developing this condition. Types of urinary incontinence include urge incontinence, stress incontinence, overflow incontinence, and functional incontinence. This condition is usually treated first with lifestyle and behavioral changes, such as quitting smoking, eating a healthier diet, and doing regular pelvic floor exercises. Other treatment options include medicines, bulking agents, medical devices, electrical nerve stimulation, or surgery. This information is not intended to replace advice given to you by your health care provider. Make sure you discuss any questions you have with your health care provider. Document Revised: 03/30/2020 Document Reviewed: 03/30/2020 Elsevier Patient Education  2024 Elsevier Inc.   Kegel Exercises  Kegel exercises can help strengthen your pelvic floor muscles. The pelvic floor is a group of muscles that support your rectum, small intestine, and bladder. In females, pelvic floor muscles also help support the uterus. These muscles help you control the flow of urine and stool (feces). Kegel exercises are painless and simple. They do not require any equipment. Your provider may suggest Kegel exercises to: Improve bladder and bowel  control. Improve sexual response. Improve weak pelvic floor muscles after surgery to remove the uterus (hysterectomy)  or after pregnancy, in females. Improve weak pelvic floor muscles after prostate gland removal or surgery, in males. Kegel exercises involve squeezing your pelvic floor muscles. These are the same muscles you squeeze when you try to stop the flow of urine or keep from passing gas. The exercises can be done while sitting, standing, or lying down, but it is best to vary your position. Ask your health care provider which exercises are safe for you. Do exercises exactly as told by your health care provider and adjust them as directed. Do not begin these exercises until told by your health care provider. Exercises How to do Kegel exercises: Squeeze your pelvic floor muscles tight. You should feel a tight lift in your rectal area. If you are a female, you should also feel a tightness in your vaginal area. Keep your stomach, buttocks, and legs relaxed. Hold the muscles tight for up to 10 seconds. Breathe normally. Relax your muscles for up to 10 seconds. Repeat as told by your health care provider. Repeat this exercise daily as told by your health care provider. Continue to do this exercise for at least 4-6 weeks, or for as long as told by your health care provider. You may be referred to a physical therapist who can help you learn more about how to do Kegel exercises. Depending on your condition, your health care provider may recommend: Varying how long you squeeze your muscles. Doing several sets of exercises every day. Doing exercises for several weeks. Making Kegel exercises a part of your regular exercise routine. This information is not intended to replace advice given to you by your health care provider. Make sure you discuss any questions you have with your health care provider. Document Revised: 01/03/2021 Document Reviewed: 01/03/2021 Elsevier Patient Education  2024 Elsevier Inc.   Cough, Adult Coughing is a reflex that clears your throat and airways (respiratory system). It helps heal  and protect your lungs. It is normal to cough from time to time. A cough that happens with other symptoms or that lasts a long time may be a sign of a condition that needs treatment. A short-term (acute) cough may only last 2-3 weeks. A long-term (chronic) cough may last 8 or more weeks. Coughing is often caused by: Diseases, such as: An infection of the respiratory system. Asthma or other heart or lung diseases. Gastroesophageal reflux. This is when acid comes back up from the stomach. Breathing in things that irritate your lungs. Allergies. Postnasal drip. This is when mucus runs down the back of your throat. Smoking. Some medicines. Follow these instructions at home: Medicines Take over-the-counter and prescription medicines only as told by your health care provider. Talk with your provider before you take cough medicine (cough suppressants). Eating and drinking Do not drink alcohol. Avoid caffeine. Drink enough fluid to keep your pee (urine) pale yellow. Lifestyle Avoid cigarette smoke. Do not use any products that contain nicotine or tobacco. These products include cigarettes, chewing tobacco, and vaping devices, such as e-cigarettes. If you need help quitting, ask your provider. Avoid things that make you cough. These may include perfumes, candles, cleaning products, or campfire smoke. General instructions  Watch for any changes to your cough. Tell your provider about them. Always cover your mouth when you cough. If the air is dry in your bedroom or home, use a cool mist vaporizer or humidifier. If your cough is worse  at night, try to sleep in a semi-upright position. Rest as needed. Contact a health care provider if: You have new symptoms, or your symptoms get worse. You cough up pus. You have a fever that does not go away or a cough that does not get better after 2-3 weeks. You cannot control your cough with medicine, and you are losing sleep. You have pain that gets worse  or is not helped with medicine. You lose weight for no clear reason. You have night sweats. Get help right away if: You cough up blood. You have trouble breathing. Your heart is beating very fast. These symptoms may be an emergency. Get help right away. Call 911. Do not wait to see if the symptoms will go away. Do not drive yourself to the hospital. This information is not intended to replace advice given to you by your health care provider. Make sure you discuss any questions you have with your health care provider. Document Revised: 04/25/2022 Document Reviewed: 04/25/2022 Elsevier Patient Education  2024 Elsevier Inc.       Signed,   Meredith Staggers, MD [facility], [facility] 08/24/23 9:55 AM

## 2023-08-26 ENCOUNTER — Other Ambulatory Visit (HOSPITAL_COMMUNITY): Payer: Medicaid Other

## 2023-08-31 ENCOUNTER — Encounter (HOSPITAL_COMMUNITY): Payer: Self-pay | Admitting: Internal Medicine

## 2023-08-31 ENCOUNTER — Ambulatory Visit (HOSPITAL_COMMUNITY): Payer: Medicaid Other | Attending: Internal Medicine

## 2023-09-14 ENCOUNTER — Ambulatory Visit: Payer: Medicaid Other | Admitting: Family Medicine

## 2023-09-14 ENCOUNTER — Encounter: Payer: Self-pay | Admitting: Family Medicine

## 2023-09-14 VITALS — BP 134/78 | HR 79 | Temp 98.2°F | Ht 63.0 in | Wt 215.1 lb

## 2023-09-14 DIAGNOSIS — R051 Acute cough: Secondary | ICD-10-CM | POA: Diagnosis not present

## 2023-09-14 LAB — POC INFLUENZA A&B (BINAX/QUICKVUE)
Influenza A, POC: NEGATIVE
Influenza B, POC: NEGATIVE

## 2023-09-14 LAB — POC COVID19 BINAXNOW: SARS Coronavirus 2 Ag: NEGATIVE

## 2023-09-14 MED ORDER — PREDNISONE 10 MG PO TABS: ORAL_TABLET | ORAL | 0 refills | Status: DC

## 2023-09-14 NOTE — Patient Instructions (Signed)
 Follow up as needed or as scheduled START the Prednisone  as directed- 3 pills at the same time x3 days, then 2 pills at the same time x3 days, then 1 pill daily.  Take w/ food  Drink LOTS of fluids REST when you cal Mucinex DM for cough and congestion USE your Albuterol  inhaler (rescue) 2 puffs every 4 hrs as needed Call with any questions or concerns Hang in there!

## 2023-09-14 NOTE — Progress Notes (Signed)
   Subjective:    Patient ID: Dana Lindsey, female    DOB: 1986/02/29, 38 y.o.   MRN: [id number]  HPI Cough- sxs started on Friday.  + cough- will get out of breath with coughing fits.  Had to use albuterol  inhaler.  No sinus pain/pressure.  No ear pain.  No HA.  No fever.  No body aches or chills.  Was tx'd on 12/16 w/ Zpack and cough medicine and sxs improved.   Review of Systems For ROS see HPI     Objective:   Physical Exam Vitals reviewed.  Constitutional:      General: She is not in acute distress.    Appearance: She is well-developed. She is not ill-appearing.  HENT:     Head: Normocephalic and atraumatic.     Right Ear: Tympanic membrane and ear canal normal.     Left Ear: Tympanic membrane and ear canal normal.     Nose: Mucosal edema and congestion present. No rhinorrhea.     Right Sinus: No maxillary sinus tenderness or frontal sinus tenderness.     Left Sinus: No maxillary sinus tenderness or frontal sinus tenderness.     Mouth/Throat:     Pharynx: Posterior oropharyngeal erythema: w/ PND.     Comments: Cold sore present Eyes:     Conjunctiva/sclera: Conjunctivae normal.     Pupils: Pupils are equal, round, and reactive to light.  Cardiovascular:     Rate and Rhythm: Normal rate and regular rhythm.     Heart sounds: Normal heart sounds.  Pulmonary:     Effort: Pulmonary effort is normal. No respiratory distress.     Breath sounds: Wheezing present. No rhonchi or rales.     Comments: Decreased BS throughout Musculoskeletal:     Cervical back: Normal range of motion and neck supple.  Lymphadenopathy:     Cervical: No cervical adenopathy.  Skin:    General: Skin is warm and dry.  Neurological:     General: No focal deficit present.     Mental Status: She is alert and oriented to person, place, and time.  Psychiatric:        Mood and Affect: Mood normal.        Behavior: Behavior normal.        Thought Content: Thought content normal.            Assessment & Plan:  Cough- new to provider.  Had similar sxs last month but these resolved w/ treatment.  Sxs returned 3 days ago.  Reports she is most concerned about her cough and SOB.  She does have a remote hx of asthma.  On exam, there is wheezing present and decreased breath sounds throughout.  + dry cough.  Will start Prednisone  as this seems like a viral triggered asthma exacerbation.  Instructed her to use the Flovent  until feeling better and the Albuterol  prn.  Reviewed supportive care and red flags that should prompt return.  Pt expressed understanding and is in agreement w/ plan.

## 2023-09-25 ENCOUNTER — Encounter: Payer: Self-pay | Admitting: Family Medicine

## 2023-09-25 ENCOUNTER — Ambulatory Visit: Payer: Medicaid Other | Admitting: Family Medicine

## 2023-09-25 VITALS — BP 128/70 | HR 83 | Temp 98.8°F | Ht 63.0 in | Wt 210.8 lb

## 2023-09-25 DIAGNOSIS — R3129 Other microscopic hematuria: Secondary | ICD-10-CM | POA: Diagnosis not present

## 2023-09-25 DIAGNOSIS — R32 Unspecified urinary incontinence: Secondary | ICD-10-CM

## 2023-09-25 DIAGNOSIS — Z6837 Body mass index (BMI) 37.0-37.9, adult: Secondary | ICD-10-CM

## 2023-09-25 DIAGNOSIS — R052 Subacute cough: Secondary | ICD-10-CM

## 2023-09-25 DIAGNOSIS — E66812 Obesity, class 2: Secondary | ICD-10-CM

## 2023-09-25 DIAGNOSIS — J4520 Mild intermittent asthma, uncomplicated: Secondary | ICD-10-CM

## 2023-09-25 LAB — POCT URINALYSIS DIP (MANUAL ENTRY)
Bilirubin, UA: NEGATIVE
Glucose, UA: NEGATIVE mg/dL
Ketones, POC UA: NEGATIVE mg/dL
Leukocytes, UA: NEGATIVE
Nitrite, UA: NEGATIVE
Protein Ur, POC: NEGATIVE mg/dL
Spec Grav, UA: 1.025 (ref 1.010–1.025)
Urobilinogen, UA: 0.2 U/dL
pH, UA: 6 (ref 5.0–8.0)

## 2023-09-25 MED ORDER — ALBUTEROL SULFATE HFA 108 (90 BASE) MCG/ACT IN AERS: 2.0000 | INHALATION_SPRAY | Freq: Four times a day (QID) | RESPIRATORY_TRACT | 0 refills | Status: AC | PRN

## 2023-09-25 MED ORDER — FLUTICASONE PROPIONATE HFA 110 MCG/ACT IN AERO: 1.0000 | INHALATION_SPRAY | Freq: Two times a day (BID) | RESPIRATORY_TRACT | 12 refills | Status: AC

## 2023-09-25 NOTE — Progress Notes (Signed)
Subjective:  Patient ID: Dana Lindsey, female    DOB: [birth year]  Age: 38 y.o. MRN: [id number]  CC:  Chief Complaint  Patient presents with   Hematuria    Pt notes she the incontinence is due to cough notes steroid did help but did not resolve the issue but this week she is not coughing as much,    Referral    Pt notes would like a referral to a nutritionist due to fluctuation in weight despite efforts     HPI [name] presents for   Cough: Treated for lower respiratory tract infection December 16.  With persistent coughing fits at that time viral versus pertussis.  Sick contact with mycoplasma at home as well as strep pharyngitis but more lower respiratory symptoms at that time and exam of throat was reassuring.  Started azithromycin and Tessalon Perles. She was seen by my colleague on January 6 for acute cough that had returned again at that time.  Had to use albuterol inhaler with shortness of breath with coughing fits.  Noted to have wheeze on exam, started on prednisone as thought to be viral triggered asthma exacerbation.  Advised to use Flovent and then albuterol as needed, and started on prednisone.  Meds helped. Has been using homemade syrup with garlic, red onion, honey and lemon.  Using albuterol 1-3  times per day. Sometimes once per day. Less cough this week. Notes with activity some.  No recent use of flovent.  No fever.   Urinary incontinence Incontinence noted with coughing fits at her December 16 visit.  Suspected primary stress incontinence but possible component of mixed incontinence.  Hematuria was noted on urinalysis but no nitrite or LE and no gross hematuria.  Kegel exercises discussed with plan for recheck urinalysis in 1 month and consider urology eval if persistent incontinence symptoms. Improved. Only with very bad cough now. Kegel exercises regularly.  No gross hematuria. No dysuria/frequency.    Nutritionist concern/obesity Reports concerns with  weight, difficulty losing weight in spite of dietary changes.  Weight has decreased from December 16 and January 6 visit.  Glucose has been normal except for a reading of 100 in 2013.  Most recent A1c a few years ago. Has tried to eat healthy - stopped flour, less rice/pasta. Has cut back on late night meals. More fruit or vegetable snack when snacking instead of other sweets. Stress eating at times.  Exercise - some home movements that she saw on video - few times per week, 15 min.  Prior liposuction.  Not interested in surgical treatments at this time.  Would like to meet with medical weight loss specialist.   Lab Results  Component Value Date   HGBA1C 5.2 04/23/2020   Wt Readings from Last 3 Encounters:  09/25/23 210 lb 12.8 oz (95.6 kg)  09/14/23 215 lb 2 oz (97.6 kg)  08/24/23 213 lb 6.4 oz (96.8 kg)  Body mass index is 37.34 kg/m.   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 Authorizing Provider  albuterol (VENTOLIN HFA) 108 (90 Base) MCG/ACT inhaler Inhale 2 puffs into the lungs every 6 (six) hours as needed for wheezing or shortness of breath. 06/16/22  Yes Shade Flood, MD  amLODipine (NORVASC) 2.5 MG  tablet Take 1 tablet (2.5 mg total) by mouth daily. 07/23/23  Yes Shade Flood, MD  fluticasone (FLONASE) 50 MCG/ACT nasal spray Place 1 spray into both nostrils daily. 06/16/22  Yes Shade Flood, MD  fluticasone (FLOVENT HFA) 44 MCG/ACT inhaler Inhale 1 puff into the lungs 2 (two) times daily as needed. For asthma flair or increased cough. 06/16/22  Yes Shade Flood, MD  hydrocortisone cream 1 % Apply to affected area 2 times daily 04/02/23  Yes Stanhope, Donavan Burnet, FNP  levonorgestrel (LILETTA, 52 MG,) 20.1 MCG/DAY IUD IUD 1 each by Intrauterine route once.   Yes [provider]  meclizine (ANTIVERT) 25 MG tablet Take 1 tablet (25 mg total) by mouth 3 (three) times daily as needed for dizziness. 07/23/23  Yes Shade Flood, MD  Multiple Vitamins-Minerals (HAIR/SKIN/NAILS/BIOTIN PO) Take 2 tablets by mouth daily.   Yes [provider]  Multiple Vitamins-Minerals (MULTIVITAMIN ADULT) CHEW Chew 2 tablets by mouth daily.   Yes [provider]  zinc oxide (MEIJER ZINC OXIDE) 20 % ointment Apply 1 Application topically as needed for irritation. 04/02/23  Yes Carlisle Beers, FNP  benzonatate (TESSALON) 100 MG capsule Take 1 capsule (100 mg total) by mouth 3 (three) times daily as needed for cough. Patient not taking: Reported on 09/25/2023 08/24/23   Shade Flood, MD  predniSONE (DELTASONE) 10 MG tablet 3 tabs x3 days and then 2 tabs x3 days and then 1 tab x3 days.  Take w/ food. Patient not taking: Reported on 09/25/2023 09/14/23   Sheliah Hatch, MD   Social History   Socioeconomic History   Marital status: Married    Spouse name: Not on file   Number of children: 4   Years of education: Not on file   Highest education level: Not on file  Occupational History   Not on file  Tobacco Use   Smoking status: Never   Smokeless tobacco: Never  Vaping Use   Vaping status: Never Used  Substance and Sexual Activity   Alcohol use: No     Comment: rare   Drug use: No   Sexual activity: Yes    Birth control/protection: I.U.D.  Other Topics Concern   Not on file  Social History Narrative   Not on file   Social Drivers of Health   Financial Resource Strain: Low Risk  (12/02/2018)   Overall Financial Resource Strain (CARDIA)    Difficulty of Paying Living Expenses: Not hard at all  Food Insecurity: No Food Insecurity (12/02/2018)   Hunger Vital Sign    Worried About Running Out of Food in the Last Year: Never true    Ran Out of Food in the Last Year: Never true  Transportation Needs: Unknown (12/02/2018)   PRAPARE - Administrator, Civil Service (Medical): No    Lack of Transportation (Non-Medical): Not  on file  Physical Activity: Not on file  Stress: Stress Concern Present (12/02/2018)   [employer] of Occupational Health - Occupational Stress Questionnaire    Feeling of Stress : To some extent  Social Connections: Not on file  Intimate Partner Violence: Not At Risk (12/02/2018)   Humiliation, Afraid, Rape, and Kick questionnaire    Fear of Current or Ex-Partner: No    Emotionally Abused: No    Physically Abused: No    Sexually Abused: No    Review of Systems Per HPI  Objective:   Vitals:   09/25/23 0912  BP: 128/70  Pulse: 83  Temp: 98.8 F (37.1 C)  TempSrc: Temporal  SpO2: 97%  Weight: 210 lb 12.8 oz (95.6 kg)  Height: 5\' 3"  (1.6 m)     Physical Exam Vitals reviewed.  Constitutional:      Appearance: Normal appearance. She is well-developed.  HENT:     Head: Normocephalic and atraumatic.  Eyes:     Conjunctiva/sclera: Conjunctivae normal.     Pupils: Pupils are equal, round, and reactive to light.  Neck:     Vascular: No carotid bruit.  Cardiovascular:     Rate and Rhythm: Normal rate and regular rhythm.     Heart sounds: Normal heart sounds.  Pulmonary:     Effort: Pulmonary effort is normal.     Breath sounds: Normal breath sounds.  Abdominal:     General: There is  no distension.     Palpations: Abdomen is soft. There is no pulsatile mass.     Tenderness: There is no abdominal tenderness. There is no guarding.  Musculoskeletal:     Right lower leg: No edema.     Left lower leg: No edema.  Skin:    General: Skin is warm and dry.  Neurological:     Mental Status: She is alert and oriented to person, place, and time.  Psychiatric:        Mood and Affect: Mood normal.        Behavior: Behavior normal.      Results for orders placed or performed in visit on 09/25/23  POCT urinalysis dipstick   Collection Time: 09/25/23 10:01 AM  Result Value Ref Range   Color, UA yellow yellow   Clarity, UA clear clear   Glucose, UA negative negative mg/dL   Bilirubin, UA negative negative   Ketones, POC UA negative negative mg/dL   Spec Grav, UA 0.272 5.366 - 1.025   Blood, UA trace-lysed (A) negative   pH, UA 6.0 5.0 - 8.0   Protein Ur, POC negative negative mg/dL   Urobilinogen, UA 0.2 0.2 or 1.0 E.U./dL   Nitrite, UA Negative Negative   Leukocytes, UA Negative Negative     Assessment & Plan:  Berklee Charvat Crunkleton is a 38 y.o. female . Subacute cough - Plan: fluticasone (FLOVENT HFA) 110 MCG/ACT inhaler Mild intermittent asthma without complication - Plan: fluticasone (FLOVENT HFA) 110 MCG/ACT inhaler, albuterol (VENTOLIN HFA) 108 (90 Base) MCG/ACT inhaler  -Improved.  Clear on exam but still with intermittent, recurrent cough, suspected underlying asthma as primary cause, especially with frequent albuterol use as above.  We discussed the difference between maintenance and rescue medications and will have her restart Flovent, higher dose ordered at 110 mcg.  Albuterol if needed for breakthrough symptoms.  RTC precautions given.  Class 2 obesity without serious comorbidity with body mass index (BMI) of 37.0 to 37.9 in adult, unspecified obesity type  -Unfortunately his had difficulty  with significant weight loss in spite of diet changes as above.  Not  interested in surgical treatment options, would like to meet with medical weight loss specialist and phone number provided.  Microscopic hematuria - Plan: POCT urinalysis dipstick, Ambulatory referral to Urology  -With intermittent incontinence, suspected stress incontinence.  That has improved with improving cough.  Microscopic hematuria noted, noted previously.  Refer to urology to decide if any other testing needed at this time.  Meds ordered this encounter  Medications   fluticasone (FLOVENT HFA) 110 MCG/ACT inhaler    Sig: Inhale 1 puff into the lungs in the morning and at bedtime.    Dispense:  1 each    Refill:  12   albuterol (VENTOLIN HFA) 108 (90 Base) MCG/ACT inhaler    Sig: Inhale 2 puffs into the lungs every 6 (six) hours as needed for wheezing or shortness of breath.    Dispense:  18 g    Refill:  0   Patient Instructions  For asthma/cough- start flovent 1 puff twice per day everyday for maintenance of asthma control. Ok to use albuterol (rescue inhaler) if needed for breakthrough wheezing or asthma cough.   I will check a urine test today -  if any persistent blood in the urine can refer you to urology.  Continue Kegel exercises.  I suspect that the incontinence will continue to improve once we can improve the cough and asthma symptoms.  Follow-up if that persists.  See number below on weight management specialist - you should not need a referral for that appointment but let me know if they do request one.  I am also happy to refer you to a nutritionist if insurance will cover nutritionist for weight management.  If you have questions, please let me know.  [facility]  [phone]     Signed,   Meredith Staggers, MD [facility], [facility] 09/25/23 1:02 PM

## 2023-09-25 NOTE — Patient Instructions (Addendum)
For asthma/cough- start flovent 1 puff twice per day everyday for maintenance of asthma control. Ok to use albuterol (rescue inhaler) if needed for breakthrough wheezing or asthma cough.   I will check a urine test today -  if any persistent blood in the urine can refer you to urology.  Continue Kegel exercises.  I suspect that the incontinence will continue to improve once we can improve the cough and asthma symptoms.  Follow-up if that persists.  See number below on weight management specialist - you should not need a referral for that appointment but let me know if they do request one.  I am also happy to refer you to a nutritionist if insurance will cover nutritionist for weight management.  If you have questions, please let me know.  [facility]  [phone]

## 2023-09-28 ENCOUNTER — Encounter: Payer: Self-pay | Admitting: Internal Medicine

## 2023-10-05 NOTE — Progress Notes (Signed)
Chief Complaint: No chief complaint on file.   History of Present Illness:  Dana Lindsey is a 38 y.o. female who is seen in consultation from Shade Flood, MD for evaluation of microscopic hematuria as well as stress urinary incontinence.  She has had 4 children, all with vaginal delivery.  Most of the time, during the day, she has no significant stress incontinence unless she has significant laughter.  Her main issue is when she is sick, with significant coughing and sneezing she will leak urine.  Sometimes the amount is fairly large.  She wears pads on a daily basis, but when she is not sick she rarely has to change these.  They are mainly for hygiene purposes.  She has a good stream.  She feels like she empties well.  She is not treated for recurring urinary tract infections.  She has never been told that she had pelvic prolapse by her gynecologist.  She has had a tummy tuck.  She has also had hemorrhoids banded.  She has been told on past 2 urinalyses that she had a small amount of blood in the urine.  She has not had gross hematuria.   Past Medical History:  Past Medical History:  Diagnosis Date   Asthma    Phreesia 03/06/2020   Cholecystolithiasis    Ear infection    Gastritis    Grade II hemorrhoids    Hyperplastic colon polyp    Shortness of breath    with exertion    Past Surgical History:  Past Surgical History:  Procedure Laterality Date   CHOLECYSTECTOMY  08/23/2012   Procedure: LAPAROSCOPIC CHOLECYSTECTOMY WITH INTRAOPERATIVE CHOLANGIOGRAM;  Surgeon: Mariella Saa, MD;  Location: [facility] OR;  Service: General;  Laterality: N/A;   COLONOSCOPY     LIPOSUCTION  03/22/2022    Allergies:  No Known Allergies  Family History:  Family History  Problem Relation Age of Onset   Hypertension Mother    Alcoholism Father    Hyperlipidemia Father    Heart attack Paternal Grandfather 57   Asthma Daughter    Asthma Son    Colon cancer Neg Hx    Stomach  cancer Neg Hx    Esophageal cancer Neg Hx    Rectal cancer Neg Hx     Social History:  Social History   Tobacco Use   Smoking status: Never   Smokeless tobacco: Never  Vaping Use   Vaping status: Never Used  Substance Use Topics   Alcohol use: No    Comment: rare   Drug use: No    Review of symptoms:  Constitutional:  Negative for unexplained weight loss, night sweats, fever, chills ENT:  Negative for nose bleeds, sinus pain, painful swallowing CV:  Negative for chest pain, shortness of breath, exercise intolerance, palpitations, loss of consciousness Resp:  Negative for cough, wheezing, shortness of breath GI:  Negative for nausea, vomiting, diarrhea, bloody stools GU:  Positives noted in HPI; otherwise negative. Neuro:  Negative for seizures, poor balance, limb weakness, slurred speech Psych:  Negative for lack of energy, depression, anxiety Endocrine:  Negative for polydipsia, polyuria, symptoms of hypoglycemia (dizziness, hunger, sweating) Hematologic:  Negative for anemia, purpura, petechia, prolonged or excessive bleeding, use of anticoagulants  Allergic:  Negative for difficulty breathing or choking as a result of exposure to anything; no shellfish allergy; no allergic response (rash/itch) to materials, foods  Physical exam: There were no vitals taken for this visit. GENERAL APPEARANCE:  Well appearing,  well developed, well nourished, NAD HEENT: Atraumatic, Normocephalic. NECK: Normal appearance LUNGS: Normal inspiratory and expiratory excursion HEART: Regular Rate ABDOMEN: Obese. GU: Normal external genitalia.  Grade 1 cystocele.  Uterus and adnexa normal.  No significant rectocele. EXTREMITIES: Moves all extremities well.  Without clubbing, cyanosis, or edema. NEUROLOGIC:  Alert and oriented x 3, normal gait, CN II-XII grossly intact.  MENTAL STATUS:  Appropriate. SKIN:  Warm, dry and intact.    Results: No results found for this or any previous visit (from the  past 24 hours).  I have reviewed referring/prior physicians records--Dr. Paralee Cancel notes  I have reviewed urinalysis--last 2 prior urinalyses reviewed.  No microscopic evaluations done  I have reviewed prior urine cultures.  Prior urine culture from 2021 negative  No recent imaging studies are available that include kidney views  Assessment: 1.  Stress urinary incontinence.  Only associated with patient having upper respiratory issues.  Minimal cystocele.  2.  Microscopic hematuria.  Present on dipstick but microscopic confirms no blood in the urine.  3.  Obesity, may well be partial because of her above problem i.e. incontinence   Plan: 1.  I recommended physical therapy.  She would like to hold off on this for now  2.  I also suggested weight loss.  She will consult Dr. Chilton Si about nutrition consultation  3.  I will have her call us if she wants further management i.e. [place] to set up physical therapy consult

## 2023-10-12 ENCOUNTER — Ambulatory Visit (INDEPENDENT_AMBULATORY_CARE_PROVIDER_SITE_OTHER): Payer: Medicaid Other | Admitting: Urology

## 2023-10-12 ENCOUNTER — Encounter: Payer: Self-pay | Admitting: Urology

## 2023-10-12 VITALS — BP 128/88 | HR 71 | Ht 63.0 in | Wt 210.0 lb

## 2023-10-12 DIAGNOSIS — R3129 Other microscopic hematuria: Secondary | ICD-10-CM | POA: Diagnosis not present

## 2023-10-12 DIAGNOSIS — N393 Stress incontinence (female) (male): Secondary | ICD-10-CM | POA: Diagnosis not present

## 2023-10-12 LAB — URINALYSIS, ROUTINE W REFLEX MICROSCOPIC
Bilirubin, UA: NEGATIVE
Glucose, UA: NEGATIVE
Ketones, UA: NEGATIVE
Leukocytes,UA: NEGATIVE
Nitrite, UA: NEGATIVE
Specific Gravity, UA: 1.02 (ref 1.005–1.030)
Urobilinogen, Ur: 0.2 mg/dL (ref 0.2–1.0)
pH, UA: 6 (ref 5.0–7.5)

## 2023-10-12 LAB — MICROSCOPIC EXAMINATION
Bacteria, UA: NONE SEEN
Casts: NONE SEEN /[LPF]
Crystals: NONE SEEN
Epithelial Cells (non renal): NONE SEEN /[HPF] (ref 0–10)
Renal Epithel, UA: NONE SEEN /[HPF]
Trichomonas, UA: NONE SEEN
WBC, UA: NONE SEEN /[HPF] (ref 0–5)
Yeast, UA: NONE SEEN

## 2023-10-14 ENCOUNTER — Ambulatory Visit: Payer: Medicaid Other | Admitting: Dermatology

## 2023-10-14 ENCOUNTER — Encounter: Payer: Self-pay | Admitting: Dermatology

## 2023-10-14 VITALS — BP 182/120

## 2023-10-14 DIAGNOSIS — L219 Seborrheic dermatitis, unspecified: Secondary | ICD-10-CM | POA: Diagnosis not present

## 2023-10-14 MED ORDER — PIMECROLIMUS 1 % EX CREA: TOPICAL_CREAM | Freq: Two times a day (BID) | CUTANEOUS | 0 refills | Status: AC | PRN

## 2023-10-14 MED ORDER — CLOBETASOL PROPIONATE 0.05 % EX SOLN: 1.0000 | Freq: Two times a day (BID) | CUTANEOUS | 3 refills | Status: AC

## 2023-10-14 NOTE — Patient Instructions (Addendum)
 Hello Ishana,  Thank you for visiting us  today.   Here is a summary of the key instructions from today's consultation:  Shampoo: Begin using DHS Zinc  Shampoo twice a week.   Application: Allow it to sit on your scalp for a few minutes before rinsing thoroughly.   Follow-Up Care: Use a hydrating shampoo afterward to prevent dryness.  Scalp Treatment: Apply clobetasol  liquid drops to the scalp as needed for flares.   Frequency: Use twice daily during flare-ups.  Facial Treatment: Use pimecrolimus  cream on affected facial areas.   Duration: Continue daily until symptoms subside.  General Advice:   Avoid applying oils directly to the scalp to prevent exacerbating the condition.   Use conditioner on the tips of your hair to maintain hydration without affecting the scalp.  Follow-Up: We will see you again in six months to assess progress and possibly adjust your treatment plan. Consistent use of the shampoo is crucial for controlling itching and flaking.  We look forward to seeing the positive changes in your next visit. If you have any questions or concerns before then, please do not hesitate to contact our office.  Warm regards,  Dr. Delon Lenis, Dermatology        Important Information  Due to recent changes in healthcare laws, you may see results of your pathology and/or laboratory studies on MyChart before the doctors have had a chance to review them. We understand that in some cases there may be results that are confusing or concerning to you. Please understand that not all results are received at the same time and often the doctors may need to interpret multiple results in order to provide you with the best plan of care or course of treatment. Therefore, we ask that you please give us  2 business days to thoroughly review all your results before contacting the office for clarification. Should we see a critical lab result, you will be contacted sooner.   If You Need Anything  After Your Visit  If you have any questions or concerns for your doctor, please call our main line at [phone] If no one answers, please leave a voicemail as directed and we will return your call as soon as possible. Messages left after 4 pm will be answered the following business day.   You may also send us  a message via MyChart. We typically respond to MyChart messages within 1-2 business days.  For prescription refills, please ask your pharmacy to contact our office. Our fax number is [phone].  If you have an urgent issue when the clinic is closed that cannot wait until the next business day, you can page your doctor at the number below.    Please note that while we do our best to be available for urgent issues outside of office hours, we are not available 24/7.   If you have an urgent issue and are unable to reach us , you may choose to seek medical care at your doctor's office, retail clinic, urgent care center, or emergency room.  If you have a medical emergency, please immediately call 911 or go to the emergency department. In the event of inclement weather, please call our main line at [phone] for an update on the status of any delays or closures.  Dermatology Medication Tips: Please keep the boxes that topical medications come in in order to help keep track of the instructions about where and how to use these. Pharmacies typically print the medication instructions only on the boxes and not  directly on the medication tubes.   If your medication is too expensive, please contact our office at [phone] or send us  a message through MyChart.   We are unable to tell what your co-pay for medications will be in advance as this is different depending on your insurance coverage. However, we may be able to find a substitute medication at lower cost or fill out paperwork to get insurance to cover a needed medication.   If a prior authorization is required to get your medication  covered by your insurance company, please allow us  1-2 business days to complete this process.  Drug prices often vary depending on where the prescription is filled and some pharmacies may offer cheaper prices.  The website www.goodrx.com contains coupons for medications through different pharmacies. The prices here do not account for what the cost may be with help from insurance (it may be cheaper with your insurance), but the website can give you the price if you did not use any insurance.  - You can print the associated coupon and take it with your prescription to the pharmacy.  - You may also stop by our office during regular business hours and pick up a GoodRx coupon card.  - If you need your prescription sent electronically to a different pharmacy, notify our office through [facility] or by phone at [phone]

## 2023-10-14 NOTE — Progress Notes (Signed)
   New Patient Visit   Subjective  Dana Lindsey is a 38 y.o. female who presents for the following: new pt - Seb Derm  Patient states she has seb derm located at the scalp and face that she would like to have examined. Patient reports the areas have been there off and on for 2 years. She reports the areas are bothersome.Patient rates irritation ranges from 6-10 out of 10. She states that the areas have spread. Patient reports she has previously been treated for these areas by PCP. Rx ketoconazole  shampoo, it worked during the first application but not subsequent uses. Patient denies Hx of bx. Patient denies family history of skin cancer(s).   The following portions of the chart were reviewed this encounter and updated as appropriate: medications, allergies, medical history  Review of Systems:  No other skin or systemic complaints except as noted in HPI or Assessment and Plan.  Objective  Well appearing patient in no apparent distress; mood and affect are within normal limits.   A focused examination was performed of the following areas: scalp & face   Relevant exam findings are noted in the Assessment and Plan.    Assessment & Plan   SEBORRHEIC DERMATITIS Exam: Pink patches with greasy scale at scalp and face  flared  Seborrheic Dermatitis is a chronic persistent rash characterized by pinkness and scaling most commonly of the mid face but also can occur on the scalp (dandruff), ears; mid chest, mid back and groin.  It tends to be exacerbated by stress and cooler weather.  People who have neurologic disease may experience new onset or exacerbation of existing seborrheic dermatitis.  The condition is not curable but treatable and can be controlled.  Treatment Plan: - Recommended using DHS Zinc  shampoo. Pic included in AVS. Let sit 2-47min then rinse. Follow with a moisturizing shampoo then condition.  - Rx clobetasol  scalp solution - use on the affected areas daily.  - Rx  pimecrolimus  1% cream - use on affected areas of the face BID SEBORRHEIC DERMATITIS   Related Medications clobetasol  (TEMOVATE ) 0.05 % external solution Apply 1 Application topically 2 (two) times daily.  No follow-ups on file.    Documentation: I have reviewed the above documentation for accuracy and completeness, and I agree with the above.  Delon Lenis, DO

## 2023-12-24 ENCOUNTER — Ambulatory Visit: Payer: Medicaid Other | Admitting: Family Medicine

## 2024-04-21 ENCOUNTER — Ambulatory Visit: Payer: Medicaid Other | Admitting: Dermatology

## 2024-05-13 ENCOUNTER — Encounter: Payer: Self-pay | Admitting: Family Medicine

## 2024-05-13 ENCOUNTER — Ambulatory Visit: Payer: Self-pay | Admitting: Family Medicine

## 2024-05-13 VITALS — BP 126/80 | HR 77 | Temp 98.6°F | Resp 15 | Ht 63.0 in | Wt 212.0 lb

## 2024-05-13 DIAGNOSIS — I10 Essential (primary) hypertension: Secondary | ICD-10-CM

## 2024-05-13 DIAGNOSIS — E66812 Obesity, class 2: Secondary | ICD-10-CM

## 2024-05-13 DIAGNOSIS — J4520 Mild intermittent asthma, uncomplicated: Secondary | ICD-10-CM

## 2024-05-13 DIAGNOSIS — R42 Dizziness and giddiness: Secondary | ICD-10-CM

## 2024-05-13 DIAGNOSIS — M549 Dorsalgia, unspecified: Secondary | ICD-10-CM

## 2024-05-13 DIAGNOSIS — R002 Palpitations: Secondary | ICD-10-CM

## 2024-05-13 DIAGNOSIS — Z6837 Body mass index (BMI) 37.0-37.9, adult: Secondary | ICD-10-CM

## 2024-05-13 LAB — LIPID PANEL
Cholesterol: 190 mg/dL (ref 0–200)
HDL: 55.3 mg/dL (ref >39.00)
LDL Cholesterol: 117 mg/dL — ABNORMAL HIGH (ref 0–99)
NonHDL: 135.17
Total CHOL/HDL Ratio: 3
Triglycerides: 90 mg/dL (ref 0.0–149.0)
VLDL: 18 mg/dL (ref 0.0–40.0)

## 2024-05-13 LAB — CBC
HCT: 41.2 % (ref 36.0–46.0)
Hemoglobin: 13.9 g/dL (ref 12.0–15.0)
MCHC: 33.8 g/dL (ref 30.0–36.0)
MCV: 86.7 fl (ref 78.0–100.0)
Platelets: 292 K/uL (ref 150.0–400.0)
RBC: 4.75 Mil/uL (ref 3.87–5.11)
RDW: 12.5 % (ref 11.5–15.5)
WBC: 7.3 K/uL (ref 4.0–10.5)

## 2024-05-13 LAB — COMPREHENSIVE METABOLIC PANEL WITH GFR
ALT: 21 U/L (ref 0–35)
AST: 20 U/L (ref 0–37)
Albumin: 4.5 g/dL (ref 3.5–5.2)
Alkaline Phosphatase: 55 U/L (ref 39–117)
BUN: 14 mg/dL (ref 6–23)
CO2: 30 meq/L (ref 19–32)
Calcium: 9.8 mg/dL (ref 8.4–10.5)
Chloride: 100 meq/L (ref 96–112)
Creatinine, Ser: 0.64 mg/dL (ref 0.40–1.20)
GFR: 110.61 mL/min (ref >60.00)
Glucose, Bld: 88 mg/dL (ref 70–99)
Potassium: 3.5 meq/L (ref 3.5–5.1)
Sodium: 139 meq/L (ref 135–145)
Total Bilirubin: 0.6 mg/dL (ref 0.2–1.2)
Total Protein: 7.8 g/dL (ref 6.0–8.3)

## 2024-05-13 LAB — HEMOGLOBIN A1C: Hgb A1c MFr Bld: 5.7 % (ref 4.6–6.5)

## 2024-05-13 LAB — TSH: TSH: 0.8 u[IU]/mL (ref 0.35–5.50)

## 2024-05-13 NOTE — Patient Instructions (Addendum)
 Looking back at your previous cardiology evaluation, your symptoms were similar at that time.  The CT scan of the heart was reassuring as well as the heart monitor, no sign of significant heart arrhythmia.  I will check some blood work again today as well as a thyroid  test.  Try to increase your fluid intake to 48 to 64 ounces of water per day to see if that is helpful for the dizziness/lightheadedness and if that helps with the fast heart rate sensation.  Will follow-up in the next few weeks.  If that is not improving, can certainly have you meet with cardiology again or repeat the heart monitor.  That may be best decided by cardiology.  If any worsening symptoms please be seen sooner.  Based on blood pressure reading today, I think it is okay to hold off on amlodipine  for now as long as blood pressures do not start going above 130/80.  We will recheck this next visit but continue to monitor at home, and bring those readings to your next visit.   For weight loss, I would recommend meeting with [facility] as their multifaceted approach may be a good fit for you as they do focus on not only medications but nutrition and other labs associated with weight management.  Here is their number again, please give them a call.  Keep up the good work with trying to stay active, eating healthy, avoiding carbohydrates.  [facility]  [phone]   See information below about back pain.  Sometimes that is a pinched nerve or a spasm in the back.  Heat or cool compresses are fine to use temporarily as well as short-term use of a medication like ibuprofen  or Aleve over-the-counter.  Range of motion, stretches throughout the day may also be helpful.  If back pain is not improved by next visit we can certainly check some imaging/x-rays.  Please be seen sooner if any new or worsening symptoms.  Thank you for coming in today.  Acute Back Pain, Adult Acute back  pain is sudden and usually short-lived. It is often caused by an injury to the muscles and tissues in the back. The injury may result from: A muscle, tendon, or ligament getting overstretched or torn. Ligaments are tissues that connect bones to each other. Lifting something improperly can cause a back strain. Wear and tear (degeneration) of the spinal disks. Spinal disks are circular tissue that provide cushioning between the bones of the spine (vertebrae). Twisting motions, such as while playing sports or doing yard work. A hit to the back. Arthritis. You may have a physical exam, lab tests, and imaging tests to find the cause of your pain. Acute back pain usually goes away with rest and home care. Follow these instructions at home: Managing pain, stiffness, and swelling Take over-the-counter and prescription medicines only as told by your health care provider. Treatment may include medicines for pain and inflammation that are taken by mouth or applied to the skin, or muscle relaxants. Your health care provider may recommend applying ice during the first 24-48 hours after your pain starts. To do this: Put ice in a plastic bag. Place a towel between your skin and the bag. Leave the ice on for 20 minutes, 2-3 times a day. Remove the ice if your skin turns bright red. This is very important. If you cannot feel pain, heat, or cold, you have a greater risk of damage to the area. If  directed, apply heat to the affected area as often as told by your health care provider. Use the heat source that your health care provider recommends, such as a moist heat pack or a heating pad. Place a towel between your skin and the heat source. Leave the heat on for 20-30 minutes. Remove the heat if your skin turns bright red. This is especially important if you are unable to feel pain, heat, or cold. You have a greater risk of getting burned. Activity  Do not stay in bed. Staying in bed for more than 1-2 days can  delay your recovery. Sit up and stand up straight. Avoid leaning forward when you sit or hunching over when you stand. If you work at a desk, sit close to it so you do not need to lean over. Keep your chin tucked in. Keep your neck drawn back, and keep your elbows bent at a 90-degree angle (right angle). Sit high and close to the steering wheel when you drive. Add lower back (lumbar) support to your car seat, if needed. Take short walks on even surfaces as soon as you are able. Try to increase the length of time you walk each day. Do not sit, drive, or stand in one place for more than 30 minutes at a time. Sitting or standing for long periods of time can put stress on your back. Do not drive or use heavy machinery while taking prescription pain medicine. Use proper lifting techniques. When you bend and lift, use positions that put less stress on your back: Hammond your knees. Keep the load close to your body. Avoid twisting. Exercise regularly as told by your health care provider. Exercising helps your back heal faster and helps prevent back injuries by keeping muscles strong and flexible. Work with a physical therapist to make a safe exercise program, as recommended by your health care provider. Do any exercises as told by your physical therapist. Lifestyle Maintain a healthy weight. Extra weight puts stress on your back and makes it difficult to have good posture. Avoid activities or situations that make you feel anxious or stressed. Stress and anxiety increase muscle tension and can make back pain worse. Learn ways to manage anxiety and stress, such as through exercise. General instructions Sleep on a firm mattress in a comfortable position. Try lying on your side with your knees slightly bent. If you lie on your back, put a pillow under your knees. Keep your head and neck in a straight line with your spine (neutral position) when using electronic equipment like smartphones or pads. To do  this: Raise your smartphone or pad to look at it instead of bending your head or neck to look down. Put the smartphone or pad at the level of your face while looking at the screen. Follow your treatment plan as told by your health care provider. This may include: Cognitive or behavioral therapy. Acupuncture or massage therapy. Meditation or yoga. Contact a health care provider if: You have pain that is not relieved with rest or medicine. You have increasing pain going down into your legs or buttocks. Your pain does not improve after 2 weeks. You have pain at night. You lose weight without trying. You have a fever or chills. You develop nausea or vomiting. You develop abdominal pain. Get help right away if: You develop new bowel or bladder control problems. You have unusual weakness or numbness in your arms or legs. You feel faint. These symptoms may represent a serious problem  that is an emergency. Do not wait to see if the symptoms will go away. Get medical help right away. Call your local emergency services (911 in the [place] Do not drive yourself to the hospital. Summary Acute back pain is sudden and usually short-lived. Use proper lifting techniques. When you bend and lift, use positions that put less stress on your back. Take over-the-counter and prescription medicines only as told by your health care provider, and apply heat or ice as told. This information is not intended to replace advice given to you by your health care provider. Make sure you discuss any questions you have with your health care provider. Document Revised: 11/16/2020 Document Reviewed: 11/16/2020 [employer]vier Patient Education  2024 [employer].

## 2024-05-13 NOTE — Progress Notes (Signed)
 Subjective:  Patient ID: Dana Lindsey, female    DOB: 1986-07-21  Age: 38 y.o. MRN: [id number]  CC:  Chief Complaint  Patient presents with   Weight Management Screening    Pt notes would like a weight loss medication, she did not check with insurance but would like to see if Georjean is an option for her at this time    Back Pain    Pain in the upper middle back for 4 days, notes no injury    HPI Dana Lindsey presents for  2 acute concerns above, last visit in January. No show for appt in April.   Weight management, obesity She is interested in starting a weight loss medication. Last discussed in January.  Difficulty losing weight in spite of dietary changes, however her weight at that time had improved from previous readings of 213, 215 down to 210.  She had tried to eat healthier with cutting back on rice, Posta, carbohydrates and had cut back on late-night meals.  Increase your fruits, vegetables for snacks instead of other sweets but did have some stress eating at times.  Exercise at that time was a few days per week for 15 minutes with home exercises.  Requested to meet with medical weight loss specialist at that time, was not interested in surgical treatments.  Phone number was provided to [facility].  Has not called weight mgt. Did lose to 69, then increased again.  Still trying to watch diet. Some stress eating at times.   Exercise - some. 3 days per week - 45 min.  Would like to try med like Wegovy.  No hx of pancreatitis, or FH of MEN syndrome, or medullary thyroid  CA.  Would not want to be on chronic meds.   No recent A1c, normal in 2021 Lab Results  Component Value Date   HGBA1C 5.2 04/23/2020   Wt Readings from Last 3 Encounters:  05/13/24 212 lb (96.2 kg)  10/12/23 210 lb (95.3 kg)  09/25/23 210 lb 12.8 oz (95.6 kg)   Upper back pain Past 4 days, no known injury. Woke up with soreness in mid back near bra line. No falls. No change in  activity or exercise. Sore to twist at times. Some sore areas in other areas of body, no no new injuries. Similar back pain in past. Improved with massage in past - some relief with massage in past few days - husband pushed on area once.  Tx: tylenol  BID- some relief.    Hypertension: Amlodipine  2.5 mg daily prior. Stopped taking meds on her own about a month ago as feeling well and blood pressure had been stable. Lightheaded every now and then prior. Not sure if any better off amlodipine . Different times during the year.  No syncope. No chest pains. Heart racing at times - daily - faster heartrate - lasts seconds, lightheaded at that time, then improves with deep breath. Notices in past with bending down.    Has seen cardiology for palpitations in the past. Eval in 07/2023 - note indicated dizziness and lightheadedness along with occasional heart racing, daily dizziness per that note.  She had a normal CT coronary morphology with CTA, coronary calcium score of 0 on 08/04/2023.  No acute noncardiac abnormalities were identified.  Zio patch, monitoring performed in January.  Rare isolated SVE's, VE's, less than 1% but no SVE or VE couplets or triplets.  No atrial fibrillation sustained ventricular tachyarrhythmias or bradycardia arrhythmias were detected.  Patient triggered events were corresponded with sinus rhythm at that time. Orthostatic blood pressures were not consistent with orthostatic hypotension or POTS syndrome.  Liquid intake - 32oz water per day, 1-2 cups coffee per day.  Eating regular meals Home readings:  BP Readings from Last 3 Encounters:  05/13/24 126/80  10/14/23 (!) 182/120  10/12/23 128/88   Lab Results  Component Value Date   CREATININE 0.71 07/17/2023    Mild intermittent asthma Last discussed in January. On flovent  as needed - once per week only if needed. Rare use and not needing albuterol .   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   Past Medical History:  Diagnosis Date   Asthma    Phreesia 03/06/2020   Cholecystolithiasis    Ear infection    Gastritis    Grade II hemorrhoids    Hyperplastic colon polyp    Shortness of breath    with exertion   Past Surgical History:  Procedure Laterality Date   CHOLECYSTECTOMY  08/23/2012   Procedure: LAPAROSCOPIC CHOLECYSTECTOMY WITH INTRAOPERATIVE CHOLANGIOGRAM;  Surgeon: Morene ONEIDA Olives, MD;  Location: [facility] OR;  Service: General;  Laterality: N/A;   COLONOSCOPY     LIPOSUCTION  03/22/2022   No Known Allergies Prior to Admission medications   Medication Sig Start Date End Date Taking? Authorizing Provider  albuterol  (VENTOLIN  HFA) 108 (90 Base) MCG/ACT inhaler Inhale 2 puffs into the lungs every 6 (six) hours as needed for wheezing or shortness of breath. 09/25/23  Yes Levora Reyes SAUNDERS, MD  amLODipine  (NORVASC ) 2.5 MG tablet Take 1 tablet (2.5 mg total) by mouth daily. 07/23/23  Yes Levora Reyes SAUNDERS, MD  clobetasol  (TEMOVATE ) 0.05 % external solution Apply 1 Application topically 2 (two) times daily. 10/14/23  Yes Alm Delon SAILOR, DO  fluticasone  (FLONASE ) 50 MCG/ACT nasal spray Place 1 spray into both nostrils daily. 06/16/22  Yes Levora Reyes SAUNDERS, MD  fluticasone  (FLOVENT  HFA) 110 MCG/ACT inhaler Inhale 1 puff into the lungs in the morning and at bedtime. 09/25/23  Yes Levora Reyes SAUNDERS, MD  hydrocortisone  cream 1 % Apply to affected area 2 times daily 04/02/23  Yes Stanhope, Dorna HERO, FNP  levonorgestrel  (LILETTA , 52 MG,) 20.1 MCG/DAY IUD IUD 1 each by Intrauterine route once.   Yes [provider]  meclizine  (ANTIVERT ) 25 MG tablet Take 1 tablet (25 mg total) by mouth 3 (three) times daily as needed for dizziness. 07/23/23  Yes Levora Reyes SAUNDERS, MD  Multiple Vitamins-Minerals (HAIR/SKIN/NAILS/BIOTIN PO) Take 2  tablets by mouth daily.   Yes [provider]  Multiple Vitamins-Minerals (MULTIVITAMIN ADULT) CHEW Chew 2 tablets by mouth daily.   Yes [provider]  pimecrolimus  (ELIDEL ) 1 % cream Apply topically 2 (two) times daily as needed. Use on affected areas on the face for flares 10/14/23  Yes Alm Delon SAILOR, DO  zinc  oxide (MEIJER ZINC  OXIDE) 20 % ointment Apply 1 Application topically as needed for irritation. 04/02/23  Yes Stanhope, Dorna HERO, FNP   Social History   Socioeconomic History   Marital status: Married    Spouse name: Not on file   Number of children: 4   Years of education: Not on file   Highest education level: Not on file  Occupational History   Not on file  Tobacco Use   Smoking status: Never  Smokeless tobacco: Never  Vaping Use   Vaping status: Never Used  Substance and Sexual Activity   Alcohol use: No    Comment: rare   Drug use: No   Sexual activity: Yes    Birth control/protection: I.U.D.  Other Topics Concern   Not on file  Social History Narrative   Not on file   Social Drivers of Health   Financial Resource Strain: Low Risk  (12/02/2018)   Overall Financial Resource Strain (CARDIA)    Difficulty of Paying Living Expenses: Not hard at all  Food Insecurity: No Food Insecurity (12/02/2018)   Hunger Vital Sign    Worried About Running Out of Food in the Last Year: Never true    Ran Out of Food in the Last Year: Never true  Transportation Needs: Unknown (12/02/2018)   PRAPARE - Administrator, Civil Service (Medical): No    Lack of Transportation (Non-Medical): Not on file  Physical Activity: Not on file  Stress: Stress Concern Present (12/02/2018)   [employer] of Occupational Health - Occupational Stress Questionnaire    Feeling of Stress : To some extent  Social Connections: Not on file  Intimate Partner Violence: Not At Risk (12/02/2018)   Humiliation, Afraid, Rape, and Kick questionnaire    Fear of Current or  Ex-Partner: No    Emotionally Abused: No    Physically Abused: No    Sexually Abused: No    Review of Systems Per HPI  Objective:   Vitals:   05/13/24 1112  BP: 126/80  Pulse: 77  Resp: 15  Temp: 98.6 F (37 C)  TempSrc: Temporal  SpO2: 98%  Weight: 212 lb (96.2 kg)  Height: 5' 3 (1.6 m)     Physical Exam Vitals reviewed.  Constitutional:      Appearance: Normal appearance. She is well-developed.  HENT:     Head: Normocephalic and atraumatic.  Eyes:     Conjunctiva/sclera: Conjunctivae normal.     Pupils: Pupils are equal, round, and reactive to light.  Neck:     Vascular: No carotid bruit.  Cardiovascular:     Rate and Rhythm: Normal rate and regular rhythm.     Heart sounds: Normal heart sounds.  Pulmonary:     Effort: Pulmonary effort is normal.     Breath sounds: Normal breath sounds.  Abdominal:     Palpations: Abdomen is soft. There is no pulsatile mass.     Tenderness: There is no abdominal tenderness.  Musculoskeletal:     Right lower leg: No edema.     Left lower leg: No edema.     Comments: Thoracic spine, no midline bony tenderness.  Reproducible discomfort over the rhomboid and trapezius on right greater than left.  Intact range of motion of shoulders, able to rotate trunk without difficulty.  Skin:    General: Skin is warm and dry.  Neurological:     Mental Status: She is alert and oriented to person, place, and time.  Psychiatric:        Mood and Affect: Mood normal.        Behavior: Behavior normal.      EKG, sinus bradycardia with rate 59.  No acute ST or T wave changes appreciated.  No significant changes when compared to 07/22/2023.  I personally spent a total of 68 minutes in the care of the patient today including preparing to see the patient, performing a medically appropriate exam/evaluation, counseling and educating, placing orders, documenting clinical information  in the EHR, independently interpreting results, communicating  results, and reviewing prior workup for palpitations, including assessment of previous heart monitor, cardiac coronary calcium scoring, discussion of symptoms above and plan.  Time is exclusive of EKG interpretation..   Assessment & Plan:  Chynah V Schweizer is a 38 y.o. female . Palpitations - Plan: CBC, TSH, Comprehensive metabolic panel with GFR, Lipid panel, Hemoglobin A1c, EKG 12-Lead Dizziness - Plan: CBC, EKG 12-Lead  - Longstanding symptoms.  Previous cardiology evaluation for similar symptoms, reassuring CT coronary calcium scoring as well as previous Zio patch/heart monitor.  No acute findings on in office EKG.  Recommend initially that she try to increase her fluid intake, close follow-up in the next few weeks and then can decide if repeat cardiology eval or monitoring needed.  RTC/ER precautions if acute worsening.  Class 2 obesity without serious comorbidity with body mass index (BMI) of 37.0 to 37.9 in adult, unspecified obesity type - Plan: Comprehensive metabolic panel with GFR, Hemoglobin A1c  - Unfortunately still having some difficulty with weight loss in spite of dietary changes and some exercise as above.  Does have some episodes of stress eating, but overall has been trying to be more compliant with diet.  I do think that she would have the best success meeting with a dedicated weight loss specialist to review nutritional recommendations, as well as consideration of medications.  At this time she would prefer not to be on a chronic medication for weight loss.  Phone number provided to [facility] again.  Mild intermittent asthma without complication  - Stable, infrequent use of inhaled corticosteroid.  We did discuss the potential need for that daily as a maintenance medication with albuterol  as needed for breakthrough symptoms, RTC precautions.  Hypertension, unspecified type - Plan: Lipid panel  - Stable, although borderline elevated off amlodipine .  Will hold on  restarting meds for now but if she has readings that increase did recommend restart of amlodipine  daily.  Close follow-up next few weeks.  Upper back pain  -Possible spasm, could be related to cervical spine but primarily in the rhomboid, trapezius.  No midline bony tenderness, no injury, hold on imaging for now with recheck next few weeks.  Home treatment discussed with range of motion, stretches, heat, short-term anti-inflammatory, and massage if that is helpful.  RTC precautions if worsening  No orders of the defined types were placed in this encounter.  Patient Instructions  Looking back at your previous cardiology evaluation, your symptoms were similar at that time.  The CT scan of the heart was reassuring as well as the heart monitor, no sign of significant heart arrhythmia.  I will check some blood work again today as well as a thyroid  test.  Try to increase your fluid intake to 48 to 64 ounces of water per day to see if that is helpful for the dizziness/lightheadedness and if that helps with the fast heart rate sensation.  Will follow-up in the next few weeks.  If that is not improving, can certainly have you meet with cardiology again or repeat the heart monitor.  That may be best decided by cardiology.  If any worsening symptoms please be seen sooner.  Based on blood pressure reading today, I think it is okay to hold off on amlodipine  for now as long as blood pressures do not start going above 130/80.  We will recheck this next visit but continue to monitor at home, and bring those readings to your next  visit.   For weight loss, I would recommend meeting with [facility] as their multifaceted approach may be a good fit for you as they do focus on not only medications but nutrition and other labs associated with weight management.  Here is their number again, please give them a call.  Keep up the good work with trying to stay active, eating healthy, avoiding  carbohydrates.  [facility]  [phone]   See information below about back pain.  Sometimes that is a pinched nerve or a spasm in the back.  Heat or cool compresses are fine to use temporarily as well as short-term use of a medication like ibuprofen  or Aleve over-the-counter.  Range of motion, stretches throughout the day may also be helpful.  If back pain is not improved by next visit we can certainly check some imaging/x-rays.  Please be seen sooner if any new or worsening symptoms.  Thank you for coming in today.  Acute Back Pain, Adult Acute back pain is sudden and usually short-lived. It is often caused by an injury to the muscles and tissues in the back. The injury may result from: A muscle, tendon, or ligament getting overstretched or torn. Ligaments are tissues that connect bones to each other. Lifting something improperly can cause a back strain. Wear and tear (degeneration) of the spinal disks. Spinal disks are circular tissue that provide cushioning between the bones of the spine (vertebrae). Twisting motions, such as while playing sports or doing yard work. A hit to the back. Arthritis. You may have a physical exam, lab tests, and imaging tests to find the cause of your pain. Acute back pain usually goes away with rest and home care. Follow these instructions at home: Managing pain, stiffness, and swelling Take over-the-counter and prescription medicines only as told by your health care provider. Treatment may include medicines for pain and inflammation that are taken by mouth or applied to the skin, or muscle relaxants. Your health care provider may recommend applying ice during the first 24-48 hours after your pain starts. To do this: Put ice in a plastic bag. Place a towel between your skin and the bag. Leave the ice on for 20 minutes, 2-3 times a day. Remove the ice if your skin turns bright red. This is very important. If you  cannot feel pain, heat, or cold, you have a greater risk of damage to the area. If directed, apply heat to the affected area as often as told by your health care provider. Use the heat source that your health care provider recommends, such as a moist heat pack or a heating pad. Place a towel between your skin and the heat source. Leave the heat on for 20-30 minutes. Remove the heat if your skin turns bright red. This is especially important if you are unable to feel pain, heat, or cold. You have a greater risk of getting burned. Activity  Do not stay in bed. Staying in bed for more than 1-2 days can delay your recovery. Sit up and stand up straight. Avoid leaning forward when you sit or hunching over when you stand. If you work at a desk, sit close to it so you do not need to lean over. Keep your chin tucked in. Keep your neck drawn back, and keep your elbows bent at a 90-degree angle (right angle). Sit high and close to the steering wheel when you drive. Add lower back (lumbar) support to your  car seat, if needed. Take short walks on even surfaces as soon as you are able. Try to increase the length of time you walk each day. Do not sit, drive, or stand in one place for more than 30 minutes at a time. Sitting or standing for long periods of time can put stress on your back. Do not drive or use heavy machinery while taking prescription pain medicine. Use proper lifting techniques. When you bend and lift, use positions that put less stress on your back: Chillum your knees. Keep the load close to your body. Avoid twisting. Exercise regularly as told by your health care provider. Exercising helps your back heal faster and helps prevent back injuries by keeping muscles strong and flexible. Work with a physical therapist to make a safe exercise program, as recommended by your health care provider. Do any exercises as told by your physical therapist. Lifestyle Maintain a healthy weight. Extra weight puts  stress on your back and makes it difficult to have good posture. Avoid activities or situations that make you feel anxious or stressed. Stress and anxiety increase muscle tension and can make back pain worse. Learn ways to manage anxiety and stress, such as through exercise. General instructions Sleep on a firm mattress in a comfortable position. Try lying on your side with your knees slightly bent. If you lie on your back, put a pillow under your knees. Keep your head and neck in a straight line with your spine (neutral position) when using electronic equipment like smartphones or pads. To do this: Raise your smartphone or pad to look at it instead of bending your head or neck to look down. Put the smartphone or pad at the level of your face while looking at the screen. Follow your treatment plan as told by your health care provider. This may include: Cognitive or behavioral therapy. Acupuncture or massage therapy. Meditation or yoga. Contact a health care provider if: You have pain that is not relieved with rest or medicine. You have increasing pain going down into your legs or buttocks. Your pain does not improve after 2 weeks. You have pain at night. You lose weight without trying. You have a fever or chills. You develop nausea or vomiting. You develop abdominal pain. Get help right away if: You develop new bowel or bladder control problems. You have unusual weakness or numbness in your arms or legs. You feel faint. These symptoms may represent a serious problem that is an emergency. Do not wait to see if the symptoms will go away. Get medical help right away. Call your local emergency services (911 in the [place] Do not drive yourself to the hospital. Summary Acute back pain is sudden and usually short-lived. Use proper lifting techniques. When you bend and lift, use positions that put less stress on your back. Take over-the-counter and prescription medicines only as told by your  health care provider, and apply heat or ice as told. This information is not intended to replace advice given to you by your health care provider. Make sure you discuss any questions you have with your health care provider. Document Revised: 11/16/2020 Document Reviewed: 11/16/2020 Elsevier Patient Education  2024 [employer]     Signed,   Reyes Pines, MD [facility], [facility] 05/13/24 3:14 PM

## 2024-05-17 ENCOUNTER — Ambulatory Visit: Payer: Self-pay | Admitting: Family Medicine

## 2024-05-23 NOTE — Progress Notes (Signed)
 Lab results have been discussed.   Verbalized understanding? Yes  Are there any questions? No

## 2024-05-26 ENCOUNTER — Encounter (INDEPENDENT_AMBULATORY_CARE_PROVIDER_SITE_OTHER): Payer: Self-pay | Admitting: Nurse Practitioner

## 2024-05-26 ENCOUNTER — Ambulatory Visit (INDEPENDENT_AMBULATORY_CARE_PROVIDER_SITE_OTHER): Payer: Self-pay | Admitting: Nurse Practitioner

## 2024-05-26 VITALS — BP 138/84 | HR 77 | Temp 98.9°F | Ht 63.0 in | Wt 207.0 lb

## 2024-05-26 DIAGNOSIS — E66812 Obesity, class 2: Secondary | ICD-10-CM

## 2024-05-26 DIAGNOSIS — Z6836 Body mass index (BMI) 36.0-36.9, adult: Secondary | ICD-10-CM

## 2024-05-26 DIAGNOSIS — E7800 Pure hypercholesterolemia, unspecified: Secondary | ICD-10-CM

## 2024-05-26 DIAGNOSIS — R7303 Prediabetes: Secondary | ICD-10-CM

## 2024-05-26 NOTE — Progress Notes (Signed)
 [address] [place], [place], [place] [address] Office: [phone]  /  Fax: [phone]   Initial Consultation    Dana Lindsey was seen in clinic today to evaluate for obesity. She is interested in losing weight to improve overall health and reduce the risk of weight related complications. She presents today to review program treatment options, initial physical assessment, and evaluation.    Anthropometrics and Bioimpedance Analysis   Body mass index is 36.67 kg/m. Body Fat Mass : 41.5 % Visceral Fat Mass Rating : 10   Obesity Related Diseases and Complications  Obesity Quality of Life and Psychosocial Complications: Body image dissatisfaction, Reduced health-related quality of life, and Decrease physical activity and social participation  Cardiometabolic: Prediabetes and/or insulin resistance, Dyslipidemia or hypercholesterolemia, DOE, and Fatigue  Biomechanical: Low back pain, Asthma, and GERD   Weight Related History  She was referred by: PCP  When asked what they would like to accomplish? She states: Adopt a healthier eating pattern and lifestyle, Improve energy levels and physical activity, Improve existing medical conditions, and Improve quality of life  Weight history: Noticed after birth of her children, first 2007- gained 50 pounds and did not get to prepregnancy weight. She has 5 kids- gave birth to 57 ages 21, 18, 41, 30, 5  Highest weight: 217  Contributing factors: family history of obesity, moderate to high levels of stress, reduced physical activity, chronic skipping of meals, strong orexigenic signaling and/or inadequate inhibitory control , multiple weight loss attempts in the past, sedentary job, and hectic pace of life  Prior weight loss attempts: Ketogenic  Current or previous pharmacotherapy: Phentermine  Response to medication: Lost weight initially but was unable to sustain weight loss- did for 3 months but was not consistent with  medication  Current nutrition plan: Low-carb and High-protein  Greatest challenge with dieting: time.  Current level of physical activity: Walking 10 minutes, three a week and Strength training 20 minutes, three  Barriers to Exercise: time  Readiness and Motivation  On a scale from 0 to 10 How ready are you to make changes to your eating and physical activity to lose weight? 10 How important is it for you to lose weight right now ? 10 How confident are you that you can lose weight if you try? 10  Past Medical History   Past Medical History:  Diagnosis Date   Asthma    [employer] 03/06/2020   Cholecystolithiasis    Ear infection    Gastritis    Grade II hemorrhoids    Hyperplastic colon polyp    Shortness of breath    with exertion     Objective    BP 138/84   Pulse 77   Temp 98.9 F (37.2 C)   Ht 5' 3 (1.6 m)   Wt 207 lb (93.9 kg)   SpO2 97%   BMI 36.67 kg/m  She was weighed on the bioimpedance scale: Body mass index is 36.67 kg/m.    General:  Alert, oriented and cooperative. Patient is in no acute distress.  Respiratory: Normal respiratory effort, no problems with respiration noted   Gait: able to ambulate independently  Mental Status: Normal mood and affect. Normal behavior. Normal judgment and thought content.   Diagnostic Data Reviewed  BMET    Component Value Date/Time   NA 139 05/13/2024 1304   NA 142 04/23/2020 1635   K 3.5 05/13/2024 1304   CL 100 05/13/2024 1304   CO2 30 05/13/2024 1304   GLUCOSE 88  05/13/2024 1304   BUN 14 05/13/2024 1304   BUN 15 04/23/2020 1635   CREATININE 0.64 05/13/2024 1304   CREATININE 0.58 08/22/2012 1524   CALCIUM 9.8 05/13/2024 1304   GFRNONAA >60 01/31/2021 1515   GFRAA 137 04/23/2020 1635   Lab Results  Component Value Date   HGBA1C 5.7 05/13/2024   HGBA1C 5.1 12/17/2016   No results found for: INSULIN CBC    Component Value Date/Time   WBC 7.3 05/13/2024 1304   RBC 4.75 05/13/2024 1304   HGB  13.9 05/13/2024 1304   HGB 13.4 06/12/2021 1027   HCT 41.2 05/13/2024 1304   HCT 38.3 06/12/2021 1027   PLT 292.0 05/13/2024 1304   PLT 261 06/12/2021 1027   MCV 86.7 05/13/2024 1304   MCV 84 06/12/2021 1027   MCH 29.3 06/12/2021 1027   MCH 29.0 01/31/2021 1515   MCHC 33.8 05/13/2024 1304   RDW 12.5 05/13/2024 1304   RDW 11.3 (L) 06/12/2021 1027   Iron/TIBC/Ferritin/ %Sat No results found for: IRON, TIBC, FERRITIN, IRONPCTSAT Lipid Panel     Component Value Date/Time   CHOL 190 05/13/2024 1304   TRIG 90.0 05/13/2024 1304   HDL 55.30 05/13/2024 1304   CHOLHDL 3 05/13/2024 1304   VLDL 18.0 05/13/2024 1304   LDLCALC 117 (H) 05/13/2024 1304   Hepatic Function Panel     Component Value Date/Time   PROT 7.8 05/13/2024 1304   PROT 7.7 04/23/2020 1635   ALBUMIN 4.5 05/13/2024 1304   ALBUMIN 4.6 04/23/2020 1635   AST 20 05/13/2024 1304   ALT 21 05/13/2024 1304   ALKPHOS 55 05/13/2024 1304   BILITOT 0.6 05/13/2024 1304   BILITOT 0.4 04/23/2020 1635      Component Value Date/Time   TSH 0.80 05/13/2024 1304    Medications  Outpatient Encounter Medications as of 05/26/2024  Medication Sig   albuterol  (VENTOLIN  HFA) 108 (90 Base) MCG/ACT inhaler Inhale 2 puffs into the lungs every 6 (six) hours as needed for wheezing or shortness of breath.   clobetasol  (TEMOVATE ) 0.05 % external solution Apply 1 Application topically 2 (two) times daily.   fluticasone  (FLONASE ) 50 MCG/ACT nasal spray Place 1 spray into both nostrils daily.   fluticasone  (FLOVENT  HFA) 110 MCG/ACT inhaler Inhale 1 puff into the lungs in the morning and at bedtime.   hydrocortisone  cream 1 % Apply to affected area 2 times daily   levonorgestrel  (LILETTA , 52 MG,) 20.1 MCG/DAY IUD IUD 1 each by Intrauterine route once.   meclizine  (ANTIVERT ) 25 MG tablet Take 1 tablet (25 mg total) by mouth 3 (three) times daily as needed for dizziness.   Multiple Vitamins-Minerals (HAIR/SKIN/NAILS/BIOTIN PO) Take 2  tablets by mouth daily.   Multiple Vitamins-Minerals (MULTIVITAMIN ADULT) CHEW Chew 2 tablets by mouth daily.   pimecrolimus  (ELIDEL ) 1 % cream Apply topically 2 (two) times daily as needed. Use on affected areas on the face for flares   zinc  oxide (MEIJER ZINC  OXIDE) 20 % ointment Apply 1 Application topically as needed for irritation.   No facility-administered encounter medications on file as of 05/26/2024.     Assessment and Plan   Pure hypercholesterolemia  Prediabetes A1c 5.7 05/13/24  Class 2 severe obesity with serious comorbidity and body mass index (BMI) of 36.0 to 36.9 in adult, unspecified obesity type (HCC)       Obesity Treatment and Action Plan:  Patient will work on garnering support from family and friends to begin weight loss journey. Will work on eliminating  or reducing the presence of highly palatable, calorie dense foods in the home. Will complete provided nutritional and psychosocial assessment questionnaire before the next appointment. Will be scheduled for indirect calorimetry to determine resting energy expenditure in a fasting state.  This will allow us  to create a reduced calorie, high-protein meal plan to promote loss of fat mass while preserving muscle mass. Counseled on the health benefits of losing 5%-15% of total body weight. Was counseled on nutritional approaches to weight loss and benefits of reducing processed foods and consuming plant-based foods and high quality protein as part of nutritional weight management. Was counseled on pharmacotherapy and role as an adjunct in weight management.   Education and Additional resources  She was weighed on the bioimpedance scale and results were discussed and documented in the synopsis.  We discussed obesity as a progressive, chronic disease and the importance of a more detailed evaluation of all the factors contributing to the disease.  We reviewed the basic principles in obesity management.   We discussed  the importance of long term lifestyle changes which include nutrition, exercise and behavioral modification as well as the importance of customizing this to her specific health and social needs.  We reviewed the role of medical interventions including pharmacotherapy and surgical interventions.   We discussed the benefits of reaching a healthier weight to alleviate the symptoms of existing conditions and reduce the risks of the biomechanical, cardiometabolic and psychological effects of obesity.  We reviewed our program approach and philosophy, which are guided by the four pillars of obesity medicine.  We discussed how to prepare for intake appointment and the importance of fasting and avoidance of stimulants for at least 8 hours prior to indirect calorimetry.  Alizaya V Chilcote appears to be in the action stage of change and reports being ready to initiate intensive lifestyle and behavioral modifications as part of their weight loss journey.  Attestation  Reviewed by clinician on day of visit: allergies, medications, problem list, medical history, surgical history, family history, social history, and previous encounter notes pertinent to obesity diagnosis.  I have spent 30 minutes in the care of the patient today including: 3 minutes before the visit reviewing and preparing the chart. 17 minutes face-to-face assessing and reviewing listed medical problems as outlined in obesity care plan, providing nutritional and behavioral counseling on topics outlined in the obesity care plan, independently interpreting test results and goals of care, as described in assessment and plan, reviewing and discussing biometric information and progress, and reviewing latest PCP notes and specialist consultations 10 minutes after the visit updating chart and documentation of encounter.  Preslea Rhodus ANP-C

## 2024-05-27 ENCOUNTER — Ambulatory Visit (INDEPENDENT_AMBULATORY_CARE_PROVIDER_SITE_OTHER): Payer: Self-pay | Admitting: Family Medicine

## 2024-05-27 ENCOUNTER — Encounter: Payer: Self-pay | Admitting: Family Medicine

## 2024-05-27 VITALS — BP 138/80 | HR 72 | Temp 98.8°F | Ht 63.0 in | Wt 213.0 lb

## 2024-05-27 DIAGNOSIS — R002 Palpitations: Secondary | ICD-10-CM

## 2024-05-27 DIAGNOSIS — J4520 Mild intermittent asthma, uncomplicated: Secondary | ICD-10-CM

## 2024-05-27 DIAGNOSIS — R42 Dizziness and giddiness: Secondary | ICD-10-CM

## 2024-05-27 DIAGNOSIS — F439 Reaction to severe stress, unspecified: Secondary | ICD-10-CM

## 2024-05-27 DIAGNOSIS — M25562 Pain in left knee: Secondary | ICD-10-CM

## 2024-05-27 DIAGNOSIS — M549 Dorsalgia, unspecified: Secondary | ICD-10-CM

## 2024-05-27 NOTE — Progress Notes (Signed)
 Subjective:  Patient ID: Dana Lindsey, female    DOB: [birth year]  Age: 38 y.o. MRN: [id number]  CC:  Chief Complaint  Patient presents with   Follow-up    Getting over a cold now with a mild cough still; back pain a little better but still painful   Knee Pain    Started about a week ago; hurts all the time; mainly bending when walking     HPI [name] presents for follow up multiple concerns from September 5 and another acute concern as above.  Palpitations with intermittent lightheadedness. Previous evaluation by cardiology including CTA, Zio patch.  Orthostatic blood pressures were not consistent with orthostatic hypotension.  Given previous evaluation recommended she increase her fluid intake initially, then possible cardiology follow-up.  Tried to increase water intake. Still feeling lightheaded at times, still occasional palpitations. No syncope, no chest pains.  Less frequent dizziness, but still feels symptoms on occasion.  Some stress, feels like symptoms may be related to stress with reassuring workup in past. Less dizzy when not active when sick last week.  Feels like need to take a deep breath at times, but nor short of breath.  Declines follow up with cardiology or neurology at this time.  Declines meeting with therapist at this time or meds for stress/anxiety.  Would like to monitor symptoms next few months.      05/13/2024   11:11 AM 09/25/2023    9:11 AM 07/17/2023   11:32 AM 04/29/2023    8:49 AM  GAD 7 : Generalized Anxiety Score  Nervous, Anxious, on Edge 0 0 0 0  Control/stop worrying 0 1 2 0  Worry too much - different things 1 0 1 1  Trouble relaxing 1 0 2 1  Restless 1 0 0 1  Easily annoyed or irritable 1 0 1 0  Afraid - awful might happen 0 0 0 0  Total GAD 7 Score 4 1 6 3   Anxiety Difficulty Not difficult at all       Upper back pain Possible spasm or related to cervical spine.  Discussed at her September 5 visit.  No midline bony  tenderness, held on imaging initially, home treatment with range of motion stretches and heat discussed short-term anti-inflammatory and massage. Symptoms have improved.   Recent upper respiratory infection with some cough but is improving. Felt worse last week - better now. No home covid testing. Sick contacts at home. No fevers. Getting better. Has used albuterol  - up 2 times per day.  Has not used flovent  hfa - using albuterol  only. Last visit misspoke - she has not used fluticasone  recently - only albuterol .   Knee pain, new concern Left knee pain. Past week. No fall/injury. No change in activity.  No prior surgery/injury. No locking/giving way.  Mild pain in past that comes and goes. No swelling. Not limiting activity.   Tx: ice. Tylenol  - some relief.    Obesity Treatment options were briefly discussed, but ultimately felt that management under healthy weight and wellness would be best.  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   Past Medical History:  Diagnosis Date   Asthma    [employer]reesia 03/06/2020   Cholecystolithiasis    Ear infection    Gastritis  Grade II hemorrhoids    Hyperplastic colon polyp    Shortness of breath    with exertion   Past Surgical History:  Procedure Laterality Date   CHOLECYSTECTOMY  08/23/2012   Procedure: LAPAROSCOPIC CHOLECYSTECTOMY WITH INTRAOPERATIVE CHOLANGIOGRAM;  Surgeon: Morene ONEIDA Olives, MD;  Location: [facility] OR;  Service: General;  Laterality: N/A;   COLONOSCOPY     LIPOSUCTION  03/22/2022   No Known Allergies Prior to Admission medications   Medication Sig Start Date End Date Taking? Authorizing Provider  albuterol  (VENTOLIN  HFA) 108 (90 Base) MCG/ACT inhaler Inhale 2 puffs into the lungs every 6 (six) hours as needed for wheezing  or shortness of breath. 09/25/23  Yes Levora Reyes SAUNDERS, MD  clobetasol  (TEMOVATE ) 0.05 % external solution Apply 1 Application topically 2 (two) times daily. 10/14/23  Yes Alm Delon SAILOR, DO  fluticasone  (FLONASE ) 50 MCG/ACT nasal spray Place 1 spray into both nostrils daily. 06/16/22  Yes Levora Reyes SAUNDERS, MD  fluticasone  (FLOVENT  HFA) 110 MCG/ACT inhaler Inhale 1 puff into the lungs in the morning and at bedtime. 09/25/23  Yes Levora Reyes SAUNDERS, MD  hydrocortisone  cream 1 % Apply to affected area 2 times daily 04/02/23  Yes Stanhope, Dorna HERO, FNP  levonorgestrel  (LILETTA , 52 MG,) 20.1 MCG/DAY IUD IUD 1 each by Intrauterine route once.   Yes [provider]  meclizine  (ANTIVERT ) 25 MG tablet Take 1 tablet (25 mg total) by mouth 3 (three) times daily as needed for dizziness. 07/23/23  Yes Levora Reyes SAUNDERS, MD  Multiple Vitamins-Minerals (HAIR/SKIN/NAILS/BIOTIN PO) Take 2 tablets by mouth daily.   Yes [provider]  Multiple Vitamins-Minerals (MULTIVITAMIN ADULT) CHEW Chew 2 tablets by mouth daily.   Yes [provider]  pimecrolimus  (ELIDEL ) 1 % cream Apply topically 2 (two) times daily as needed. Use on affected areas on the face for flares 10/14/23  Yes Alm Delon SAILOR, DO  zinc  oxide (MEIJER ZINC  OXIDE) 20 % ointment Apply 1 Application topically as needed for irritation. 04/02/23  Yes Stanhope, Dorna HERO, FNP   Social History   Socioeconomic History   Marital status: Married    Spouse name: Not on file   Number of children: 4   Years of education: Not on file   Highest education level: Not on file  Occupational History   Not on file  Tobacco Use   Smoking status: Never   Smokeless tobacco: Never  Vaping Use   Vaping status: Never Used  Substance and Sexual Activity   Alcohol use: No    Comment: rare   Drug use: No   Sexual activity: Yes    Birth control/protection: I.U.D.  Other Topics Concern   Not on file  Social History Narrative   Not on  file   Social Drivers of Health   Financial Resource Strain: Low Risk  (12/02/2018)   Overall Financial Resource Strain (CARDIA)    Difficulty of Paying Living Expenses: Not hard at all  Food Insecurity: No Food Insecurity (12/02/2018)   Hunger Vital Sign    Worried About Running Out of Food in the Last Year: Never true    Ran Out of Food in the Last Year: Never true  Transportation Needs: Unknown (12/02/2018)   PRAPARE - Administrator, Civil Service (Medical): No    Lack of Transportation (Non-Medical): Not on file  Physical Activity: Not on file  Stress: Stress Concern Present (12/02/2018)   [employer] of Occupational Health - Occupational Stress Questionnaire  Feeling of Stress : To some extent  Social Connections: Not on file  Intimate Partner Violence: Not At Risk (12/02/2018)   Humiliation, Afraid, Rape, and Kick questionnaire    Fear of Current or Ex-Partner: No    Emotionally Abused: No    Physically Abused: No    Sexually Abused: No    Review of Systems   Objective:   Vitals:   05/27/24 1133  BP: 138/80  Pulse: 72  Temp: 98.8 F (37.1 C)  SpO2: 98%  Weight: 213 lb (96.6 kg)  Height: 5' 3 (1.6 m)     Physical Exam Vitals reviewed.  Constitutional:      Appearance: Normal appearance. She is well-developed.  HENT:     Head: Normocephalic and atraumatic.  Eyes:     Conjunctiva/sclera: Conjunctivae normal.     Pupils: Pupils are equal, round, and reactive to light.  Neck:     Vascular: No carotid bruit.  Cardiovascular:     Rate and Rhythm: Normal rate and regular rhythm.     Heart sounds: Normal heart sounds.  Pulmonary:     Effort: Pulmonary effort is normal.     Breath sounds: Normal breath sounds.  Abdominal:     Palpations: Abdomen is soft. There is no pulsatile mass.     Tenderness: There is no abdominal tenderness.  Musculoskeletal:     Right lower leg: No edema.     Left lower leg: No edema.  Skin:    General: Skin is  warm and dry.  Neurological:     Mental Status: She is alert and oriented to person, place, and time.  Psychiatric:        Mood and Affect: Mood normal.        Behavior: Behavior normal.        Assessment & Plan:  Gizella Belleville Densmore is a 38 y.o. female . Mild intermittent asthma without complication  - Clear on exam.  Correct use of inhaled corticosteroid as maintenance medicine versus albuterol  discussed with albuterol  as breakthrough as needed, RTC precautions.  Palpitations Dizziness  - Improved, reassuring cardiology evaluation previously.  Offered repeat cardiology evaluation given recurrent intermittent symptoms but declined at this time.  Question if these may be related to stress or overexertion at times.  Reassuring exam, option to meet with cardiology or specialist as above declined for now.  Continued hydration throughout the day.   Upper back pain  - Improved.  RTC precautions if recurs  Left knee pain, unspecified chronicity  - Intermittent symptoms.  Overall reassuring exam.  Over-the-counter Voltaren gel as option, range of motion and RTC precautions if persistent pain, mechanical symptoms, swelling or other worsening.  Situational stress  - Management techniques have been discussed, declines additional treatment or need for additional resources at this time.  Handout given on stress management.  RTC precautions given.  No orders of the defined types were placed in this encounter.  Patient Instructions  I am encouraged that some your symptoms have improved with increased water intake so I would recommend he continue to make sure you are staying well-hydrated throughout the day.  The next step for his heart palpitations and lightheadedness or dizziness would be to follow back up with cardiology or could meet with neurology.  Let me know if you change your mind on either 1 of those specialist.  As we discussed stress could be contributing to some of your symptoms, so see  information below on stress management to see if  any of those apply to you.  Okay to use Voltaren gel over-the-counter as needed for knee pain.  Tylenol , ice, range of motion, stretches are fine.  If that pain does not improve into the next week or 2, I am happy to see you in follow-up.  Be seen if any new or worsening symptoms.  Glad to hear you are overall improving from the cold or infection last week.  With your asthma, I do recommend starting back on a fluticasone  inhaler as that can help manage asthma symptoms with albuterol  only as needed.  If you continue to require albuterol  frequently into next week, let me know.  Again that should improve with use of fluticasone  inhaler.  Let us  know if there are questions.  Take care.   Return to the clinic or go to the nearest emergency room if any of your symptoms worsen or new symptoms occur.     Managing Stress, Adult Feeling a certain amount of stress is normal. Stress helps our body and mind get ready to deal with the demands of life. Stress hormones can motivate you to do well at work and meet your responsibilities. But severe or long-term (chronic) stress can affect your mental and physical health. Chronic stress puts you at higher risk for: Anxiety and depression. Other health problems such as digestive problems, muscle aches, heart disease, high blood pressure, and stroke. What are the causes? Common causes of stress include: Demands from work, such as deadlines, feeling overworked, or having long hours. Pressures at home, such as money issues, disagreements with a spouse, or parenting issues. Pressures from major life changes, such as divorce, moving, loss of a loved one, or chronic illness. You may be at higher risk for stress-related problems if you: Do not get enough sleep. Are in poor health. Do not have emotional support. Have a mental health disorder such as anxiety or depression. How to recognize stress Stress can make  you: Have trouble sleeping. Feel sad, anxious, irritable, or overwhelmed. Lose your appetite. Overeat or want to eat unhealthy foods. Want to use drugs or alcohol. Stress can also cause physical symptoms, such as: Sore, tense muscles, especially in the shoulders and neck. Headaches. Trouble breathing. A faster heart rate. Stomach pain, nausea, or vomiting. Diarrhea or constipation. Trouble concentrating. Follow these instructions at home: Eating and drinking Eat a healthy diet. This includes: Eating foods that are high in fiber, such as beans, whole grains, and fresh fruits and vegetables. Limiting foods that are high in fat and processed sugars, such as fried or sweet foods. Do not skip meals or overeat. Drink enough fluid to keep your urine pale yellow. Alcohol use Do not drink alcohol if: Your health care provider tells you not to drink. You are pregnant, may be pregnant, or are planning to become pregnant. Drinking alcohol is a way some people try to ease their stress. This can be dangerous, so if you drink alcohol: Limit how much you have to: 0-1 drink a day for women. 0-2 drinks a day for men. Know how much alcohol is in your drink. In the [place] one drink equals one 12 oz bottle of beer (355 mL), one 5 oz glass of wine (148 mL), or one 1 oz glass of hard liquor (44 mL). Activity  Include 30 minutes of exercise in your daily schedule. Exercise is a good stress reducer. Include time in your day for an activity that you find relaxing. Try taking a walk, going on a bike  ride, reading a book, or listening to music. Schedule your time in a way that lowers stress, and keep a regular schedule. Focus on doing what is most important to get done. Lifestyle Identify the source of your stress and your reaction to it. See a therapist who can help you change unhelpful reactions. When there are stressful events: Talk about them with family, friends, or coworkers. Try to think  realistically about stressful events and not ignore them or overreact. Try to find the positives in a stressful situation and not focus on the negatives. Cut back on responsibilities at work and home, if possible. Ask for help from friends or family members if you need it. Find ways to manage stress, such as: Mindfulness, meditation, or deep breathing. Yoga or tai chi. Progressive muscle relaxation. Spending time in nature. Doing art, playing music, or reading. Making time for fun activities. Spending time with family and friends. Get support from family, friends, or spiritual resources. General instructions Get enough sleep. Try to go to sleep and get up at about the same time every day. Take over-the-counter and prescription medicines only as told by your health care provider. Do not use any products that contain nicotine or tobacco. These products include cigarettes, chewing tobacco, and vaping devices, such as e-cigarettes. If you need help quitting, ask your health care provider. Do not use drugs or smoke to deal with stress. Keep all follow-up visits. This is important. Where to find support Talk with your health care provider about stress management or finding a support group. Find a therapist to work with you on your stress management techniques. Where to find more information [employer] on Mental Illness: www.nami.org American Psychological Association: [employer].ca Contact a health care provider if: Your stress symptoms get worse. You are unable to manage your stress at home. You are struggling to stop using drugs or alcohol. Get help right away if: You may be a danger to yourself or others. You have any thoughts of death or suicide. Get help right awayif you feel like you may hurt yourself or others, or have thoughts about taking your own life. Go to your nearest emergency room or: Call 911. Call the National Suicide Prevention Lifeline at (780) 704-3702 or 988 in  the [place] This is open 24 hours a day. If you're a Veteran: Call 988 and press 1. This is open 24 hours a day. Text the [employer] at (518)350-9800. Summary Feeling a certain amount of stress is normal, but severe or long-term (chronic) stress can affect your mental and physical health. Chronic stress can put you at higher risk for anxiety, depression, and other health problems such as digestive problems, muscle aches, heart disease, high blood pressure, and stroke. You may be at higher risk for stress-related problems if you do not get enough sleep, are in poor health, lack emotional support, or have a mental health disorder such as anxiety or depression. Identify the source of your stress and your reaction to it. Try talking about stressful events with family, friends, or coworkers, finding a coping method, or getting support from spiritual resources. If you need more help, talk with your health care provider about finding a support group or a mental health therapist. This information is not intended to replace advice given to you by your health care provider. Make sure you discuss any questions you have with your health care provider. Document Revised: 04/09/2023 Document Reviewed: 03/19/2021 Elsevier Patient Education  2024 [employer].    Signed,  Reyes Pines, MD [facility], [facility] 05/27/24 12:05 PM

## 2024-05-27 NOTE — Patient Instructions (Addendum)
 I am encouraged that some your symptoms have improved with increased water intake so I would recommend you continue to make sure you are staying well-hydrated throughout the day.  The next step for heart palpitations and lightheadedness or dizziness would be to follow back up with cardiology or could meet with neurology.  Let me know if you change your mind on either of those specialists.  As we discussed stress could be contributing to some of your symptoms, so see information below on stress management to see if any of those apply to you.  Okay to use Voltaren gel over-the-counter as needed for knee pain.  Tylenol , ice, range of motion, stretches are fine.  If that pain does not improve into the next week or 2, I am happy to see you in follow-up.  Be seen if any new or worsening symptoms.  Glad to hear you are overall improving from the cold or infection last week.  With your asthma, I do recommend starting back on a fluticasone  inhaler as that can help manage asthma symptoms with albuterol  only as needed.  If you continue to require albuterol  frequently into next week, let me know.  Again that should improve with use of fluticasone  inhaler.  Let us  know if there are questions.  Take care.   Return to the clinic or go to the nearest emergency room if any of your symptoms worsen or new symptoms occur.     Managing Stress, Adult Feeling a certain amount of stress is normal. Stress helps our body and mind get ready to deal with the demands of life. Stress hormones can motivate you to do well at work and meet your responsibilities. But severe or long-term (chronic) stress can affect your mental and physical health. Chronic stress puts you at higher risk for: Anxiety and depression. Other health problems such as digestive problems, muscle aches, heart disease, high blood pressure, and stroke. What are the causes? Common causes of stress include: Demands from work, such as deadlines, feeling  overworked, or having long hours. Pressures at home, such as money issues, disagreements with a spouse, or parenting issues. Pressures from major life changes, such as divorce, moving, loss of a loved one, or chronic illness. You may be at higher risk for stress-related problems if you: Do not get enough sleep. Are in poor health. Do not have emotional support. Have a mental health disorder such as anxiety or depression. How to recognize stress Stress can make you: Have trouble sleeping. Feel sad, anxious, irritable, or overwhelmed. Lose your appetite. Overeat or want to eat unhealthy foods. Want to use drugs or alcohol. Stress can also cause physical symptoms, such as: Sore, tense muscles, especially in the shoulders and neck. Headaches. Trouble breathing. A faster heart rate. Stomach pain, nausea, or vomiting. Diarrhea or constipation. Trouble concentrating. Follow these instructions at home: Eating and drinking Eat a healthy diet. This includes: Eating foods that are high in fiber, such as beans, whole grains, and fresh fruits and vegetables. Limiting foods that are high in fat and processed sugars, such as fried or sweet foods. Do not skip meals or overeat. Drink enough fluid to keep your urine pale yellow. Alcohol use Do not drink alcohol if: Your health care provider tells you not to drink. You are pregnant, may be pregnant, or are planning to become pregnant. Drinking alcohol is a way some people try to ease their stress. This can be dangerous, so if you drink alcohol: Limit how much you have  to: 0-1 drink a day for women. 0-2 drinks a day for men. Know how much alcohol is in your drink. In the [place] one drink equals one 12 oz bottle of beer (355 mL), one 5 oz glass of wine (148 mL), or one 1 oz glass of hard liquor (44 mL). Activity  Include 30 minutes of exercise in your daily schedule. Exercise is a good stress reducer. Include time in your day for an activity  that you find relaxing. Try taking a walk, going on a bike ride, reading a book, or listening to music. Schedule your time in a way that lowers stress, and keep a regular schedule. Focus on doing what is most important to get done. Lifestyle Identify the source of your stress and your reaction to it. See a therapist who can help you change unhelpful reactions. When there are stressful events: Talk about them with family, friends, or coworkers. Try to think realistically about stressful events and not ignore them or overreact. Try to find the positives in a stressful situation and not focus on the negatives. Cut back on responsibilities at work and home, if possible. Ask for help from friends or family members if you need it. Find ways to manage stress, such as: Mindfulness, meditation, or deep breathing. Yoga or tai chi. Progressive muscle relaxation. Spending time in nature. Doing art, playing music, or reading. Making time for fun activities. Spending time with family and friends. Get support from family, friends, or spiritual resources. General instructions Get enough sleep. Try to go to sleep and get up at about the same time every day. Take over-the-counter and prescription medicines only as told by your health care provider. Do not use any products that contain nicotine or tobacco. These products include cigarettes, chewing tobacco, and vaping devices, such as e-cigarettes. If you need help quitting, ask your health care provider. Do not use drugs or smoke to deal with stress. Keep all follow-up visits. This is important. Where to find support Talk with your health care provider about stress management or finding a support group. Find a therapist to work with you on your stress management techniques. Where to find more information [employer] Mental Illness: www.nami.org [employer]: [employer] Contact a health care provider if: Your stress  symptoms get worse. You are unable to manage your stress at home. You are struggling to stop using drugs or alcohol. Get help right away if: You may be a danger to yourself or others. You have any thoughts of death or suicide. Get help right awayif you feel like you may hurt yourself or others, or have thoughts about taking your own life. Go to your nearest emergency room or: Call 911. Call the National Suicide Prevention Lifeline at (539) 695-0095 or 988 in the [place] This is open 24 hours a day. If you're a Veteran: Call 988 and press 1. This is open 24 hours a day. Text the [employer] at 904-214-0506. Summary Feeling a certain amount of stress is normal, but severe or long-term (chronic) stress can affect your mental and physical health. Chronic stress can put you at higher risk for anxiety, depression, and other health problems such as digestive problems, muscle aches, heart disease, high blood pressure, and stroke. You may be at higher risk for stress-related problems if you do not get enough sleep, are in poor health, lack emotional support, or have a mental health disorder such as anxiety or depression. Identify the source of your stress and your  reaction to it. Try talking about stressful events with family, friends, or coworkers, finding a coping method, or getting support from spiritual resources. If you need more help, talk with your health care provider about finding a support group or a mental health therapist. This information is not intended to replace advice given to you by your health care provider. Make sure you discuss any questions you have with your health care provider. Document Revised: 04/09/2023 Document Reviewed: 03/19/2021 [employer] Patient Education  2024 [employer].

## 2024-09-26 ENCOUNTER — Ambulatory Visit: Payer: Self-pay | Admitting: Dermatology

## 2024-09-29 ENCOUNTER — Ambulatory Visit: Payer: Self-pay | Admitting: Family Medicine
